# Patient Record
Sex: Female | Born: 1990 | Race: Black or African American | Hispanic: No | Marital: Single | Smoking: Never smoker
Health system: Southern US, Community
[De-identification: ages and names within clinical notes are randomized; demographics above are authoritative.]

## PROBLEM LIST (undated history)

## (undated) ENCOUNTER — Inpatient Hospital Stay (HOSPITAL_COMMUNITY): Payer: Self-pay

## (undated) DIAGNOSIS — F419 Anxiety disorder, unspecified: Secondary | ICD-10-CM

## (undated) DIAGNOSIS — T7840XA Allergy, unspecified, initial encounter: Secondary | ICD-10-CM

## (undated) DIAGNOSIS — D649 Anemia, unspecified: Secondary | ICD-10-CM

## (undated) DIAGNOSIS — L309 Dermatitis, unspecified: Secondary | ICD-10-CM

## (undated) DIAGNOSIS — F32A Depression, unspecified: Secondary | ICD-10-CM

## (undated) DIAGNOSIS — J189 Pneumonia, unspecified organism: Secondary | ICD-10-CM

## (undated) DIAGNOSIS — Z9889 Other specified postprocedural states: Secondary | ICD-10-CM

## (undated) HISTORY — DX: Allergy, unspecified, initial encounter: T78.40XA

## (undated) HISTORY — DX: Depression, unspecified: F32.A

## (undated) HISTORY — DX: Pneumonia, unspecified organism: J18.9

## (undated) HISTORY — DX: Dermatitis, unspecified: L30.9

## (undated) HISTORY — PX: WISDOM TOOTH EXTRACTION: SHX21

## (undated) HISTORY — DX: Anxiety disorder, unspecified: F41.9

## (undated) HISTORY — DX: Anemia, unspecified: D64.9

---

## 2012-04-21 DIAGNOSIS — Z9889 Other specified postprocedural states: Secondary | ICD-10-CM

## 2014-04-21 DIAGNOSIS — J984 Other disorders of lung: Secondary | ICD-10-CM | POA: Insufficient documentation

## 2014-04-21 HISTORY — DX: Other disorders of lung: J98.4

## 2014-10-05 DIAGNOSIS — L309 Dermatitis, unspecified: Secondary | ICD-10-CM | POA: Insufficient documentation

## 2017-03-16 ENCOUNTER — Other Ambulatory Visit: Payer: Self-pay | Admitting: Family Medicine

## 2017-03-16 DIAGNOSIS — S8992XD Unspecified injury of left lower leg, subsequent encounter: Secondary | ICD-10-CM

## 2017-03-17 ENCOUNTER — Ambulatory Visit
Admission: RE | Admit: 2017-03-17 | Discharge: 2017-03-17 | Disposition: A | Discharge status: Home / Self Care | Payer: BC Managed Care – PPO | Source: Ambulatory Visit | Attending: Family Medicine | Admitting: Family Medicine

## 2017-03-17 DIAGNOSIS — S8992XD Unspecified injury of left lower leg, subsequent encounter: Secondary | ICD-10-CM

## 2020-02-03 ENCOUNTER — Encounter: Payer: Self-pay | Admitting: Internal Medicine

## 2020-02-03 ENCOUNTER — Other Ambulatory Visit: Payer: Self-pay

## 2020-02-03 ENCOUNTER — Ambulatory Visit: Payer: BC Managed Care – PPO | Admitting: Internal Medicine

## 2020-02-03 VITALS — BP 110/70 | HR 108 | Temp 96.3°F | Ht 68.0 in | Wt 176.8 lb

## 2020-02-03 DIAGNOSIS — R079 Chest pain, unspecified: Secondary | ICD-10-CM

## 2020-02-03 DIAGNOSIS — J984 Other disorders of lung: Secondary | ICD-10-CM | POA: Diagnosis not present

## 2020-02-03 NOTE — Progress Notes (Signed)
Angela Sexton    [id number]    02-May-1990  Primary Care Physician:Morrison, Rachel Bo, FNP  Referring Physician: Trisha Mangle, FNP [address] [place],  [place] [address] Reason for Consultation: chest pain Date of Consultation: 02/03/2020  Chief complaint:   Chief Complaint  Patient presents with  . Consult    bronchogenic cyst, chest pain last few years     HPI: Angela Sexton is 29 y.o. woman who presents for chest pain. Started 7-8 years ago which prompted CT angio in 2015 showing bronchogenic cyst. A non contrasted CT chest in 2018 showed resolution.   Pain is sharp and lasts for minutes resolves spontaneously. Pain is localized to her right side of her sternum. Sometimes radiating. Pain can occur at rest, yesterday she was driving her car. Pain occurs 3-4 days/week usually no more than 1-2 times/day. No associated cough, hemoptysis, wheezing or dyspnea.  As an infant she had pneumonia and was hospitalized. Nothing since then. No asthma or recurrent infections.   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  Exposures: from [place]  Smoking history: never smoker. No passive smoke exposure  Social History   Occupational History  . Not on file  Tobacco Use  . Smoking status: Never Smoker  . Smokeless tobacco: Never Used  Substance and Sexual Activity  . Alcohol use: Not on file  . Drug use: Not on file  . Sexual activity: Not on file    Relevant family history:  Family History  Problem Relation Age of Onset  . Lung disease Neg Hx     History reviewed. No pertinent past medical history.  History reviewed. No pertinent surgical history.   Physical Exam: Blood pressure 110/70, pulse (!) 108, temperature (!) 96.3 F (35.7 C), temperature source Temporal, height 5\' 8"  (1.727 m), weight 176 lb 12.8 oz (80.2 kg), SpO2 97 %. Gen:      No acute distress ENT:  no nasal polyps, mucus membranes moist Lungs:    No  increased respiratory effort, symmetric chest wall excursion, clear to auscultation bilaterally, no wheezes or crackles, no tenderness to palpation.  CV:         Regular rate and rhythm; no murmurs, rubs, or gallops.  No pedal edema Abd:      + bowel sounds; soft, non-tender; no distension MSK: no acute synovitis of DIP or PIP joints, no mechanics hands.  Skin:      Warm and dry; no rashes Neuro: normal speech, no focal facial asymmetry Psych: alert and oriented x3, normal mood and affect   Data Reviewed/Medical Decision Making:  Independent interpretation of tests: Imaging:  CT Chest [facility] Dec 2015 Negative for acute pulmonary emboli..   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 Six-month followup recommended  Chest ct [facility] Jan 2018 report FINDINGS: Previously questioned bronchogenic cyst in the azygoesophageal recess is no longer seen. The trachea and mainstem bronchi are patent. The lungs are clear. No adenopathy. Normal heart size. No pleural or pericardial effusion. Negative upper  abdomen.   PFTs: normal spirometry in Jan 2018  Labs:  CBC shows Hgb 11.1, WBC 9.7 CMP shows K 3.4, otherwise all values WNL.   Immunization status:   There is no immunization history on file for this patient.  . I reviewed prior external note(s) from [facility] . I reviewed the result(s) of the labs and imaging as noted above.  . I have  ordered CT chest with contrast  Discussion of management or test interpretation with another colleague.  Assessment:  Chest Pain History of bronchogenic cyst  Plan/Recommendations: I Ms. Danner is a 29 year old woman with a history of bronchogenic cyst seen previously on CT angio on 2015 presents with chest pain.  Her chest pain is not clearly in a pulmonary or cardiac pattern.  It is not reproducible on exam.  Furthermore she is had a cardiac work-up including stress test recently at [facility].  Although her CT chest in 2018  showed resolution of the bronchogenic cyst, this was done without contrast.  Possible we are unable to visualize mediastinal structures.  We will proceed with a CT chest with contrast and she will bring her old CT scans at her next appointment for [place] to review.  We discussed disease management and progression at length today.    Return to Care: No follow-ups on file.  Durel Salts, MD Pulmonary and Critical Care Medicine [facility] Office:[phone]  CC: Grace Bushy [place], [place]

## 2020-02-03 NOTE — Patient Instructions (Signed)
The patient should have follow up scheduled with myself in 1 months.    Please call [facility] and get copies of your CT scans of the chest (2015 and 2018) put on a CD for me to review. Bring to the office.   I am ordering another scan of your chest to make sure there is no further bronchogenic cyst.

## 2020-02-20 ENCOUNTER — Other Ambulatory Visit: Payer: Medicaid Other

## 2020-02-28 ENCOUNTER — Ambulatory Visit (HOSPITAL_COMMUNITY): Payer: Medicaid Other | Attending: Internal Medicine

## 2020-03-06 ENCOUNTER — Encounter: Payer: Self-pay | Admitting: Internal Medicine

## 2020-03-06 ENCOUNTER — Ambulatory Visit: Payer: Medicaid Other | Admitting: Internal Medicine

## 2020-03-06 ENCOUNTER — Other Ambulatory Visit: Payer: Self-pay

## 2020-03-06 VITALS — BP 118/76 | HR 88 | Temp 97.7°F | Ht 68.0 in | Wt 180.8 lb

## 2020-03-06 DIAGNOSIS — J984 Other disorders of lung: Secondary | ICD-10-CM | POA: Diagnosis not present

## 2020-03-06 DIAGNOSIS — R079 Chest pain, unspecified: Secondary | ICD-10-CM

## 2020-03-06 NOTE — Patient Instructions (Signed)
Please obtain your CT scan. I will contact you with the results and next steps.

## 2020-03-06 NOTE — Progress Notes (Signed)
         Angela Sexton    [id number]    Feb 08, 1991  Primary Care Physician:Morrison, Rachel Bo, FNP Date of Appointment: 03/06/2020 Established Patient Visit  Chief complaint:   Chief Complaint  Patient presents with  . Follow-up    on bronchial genetic cyst    HPI: Angela Sexton is a 29 y.o. woman with history of recurrent chest pain and previous history of bronchogenic cyst.  Interval Updates: Presents for follow up today. Was unaware for appointment for CT scan last week. No further episodes of chest pain or shortness of breath.   I have reviewed the patient's family social and past medical history and updated as appropriate.   History reviewed. No pertinent past medical history.  History reviewed. No pertinent surgical history.  Family History  Problem Relation Age of Onset  . Lung disease Neg Hx     Social History   Occupational History  . Not on file  Tobacco Use  . Smoking status: Never Smoker  . Smokeless tobacco: Never Used  Substance and Sexual Activity  . Alcohol use: Not on file  . Drug use: Not on file  . Sexual activity: Not on file     Physical Exam: Blood pressure 118/76, pulse 88, temperature 97.7 F (36.5 C), temperature source Oral, height 5\' 8"  (1.727 m), weight 180 lb 12.8 oz (82 kg), SpO2 98 %.  Gen:      No acute distress Lungs:    No increased respiratory effort, symmetric chest wall excursion, clear to auscultation bilaterally, no wheezes or crackles CV:         Regular rate and rhythm; no murmurs, rubs, or gallops.  No pedal edema   Data Reviewed: Imaging: Previous reports reviewed.   CT Chest [facility] Dec 2015 Negative for acute pulmonary emboli..   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 Six-month followup recommended  Chest ct [facility] Jan 2018 report FINDINGS: Previously questioned bronchogenic cyst in the azygoesophageal recess is no longer seen. The trachea and mainstem bronchi are patent. The  lungs are clear. No adenopathy. Normal heart size. No pleural or pericardial effusion. Negative upper  abdomen.  PFTs: None on file  Labs:  Immunization status: Immunization History  Administered Date(s) Administered  . PFIZER SARS-COV-2 Vaccination 06/28/2019, 07/29/2019    Assessment:  Chest Pain History of bronchogenic cyst  Plan/Recommendations: I Angela Sexton is a 29 year old woman with a history of bronchogenic cyst seen previously on CT angio on 2015 presents with chest pain.  Her chest pain is not clearly in a pulmonary or cardiac pattern.  It is not reproducible on exam.  Furthermore she is had a cardiac work-up including stress test recently at [facility].  Although her CT chest in 2018 showed resolution of the bronchogenic cyst, this was done without contrast.  Possible we are unable to visualize mediastinal structures.    I have asked her to get a CT scan with contrast to evaluate if she still even has this bronchogenic cyst. Will help patient obtain this with reschedule appt today. I will contact her with the results of the CT scan.   Return to Care: Pending results of CT scan.   2019, MD Pulmonary and Critical Care Medicine [facility] Office:[phone]

## 2020-03-28 ENCOUNTER — Other Ambulatory Visit: Payer: Medicaid Other

## 2020-04-02 ENCOUNTER — Ambulatory Visit (HOSPITAL_BASED_OUTPATIENT_CLINIC_OR_DEPARTMENT_OTHER): Payer: Medicaid Other

## 2020-09-26 ENCOUNTER — Emergency Department (HOSPITAL_BASED_OUTPATIENT_CLINIC_OR_DEPARTMENT_OTHER)
Admission: EM | Admit: 2020-09-26 | Discharge: 2020-09-27 | Disposition: A | Discharge status: Home / Self Care | Payer: Medicaid Other | Attending: Emergency Medicine | Admitting: Emergency Medicine

## 2020-09-26 ENCOUNTER — Other Ambulatory Visit: Payer: Self-pay

## 2020-09-26 DIAGNOSIS — Z20822 Contact with and (suspected) exposure to covid-19: Secondary | ICD-10-CM | POA: Insufficient documentation

## 2020-09-26 DIAGNOSIS — R0789 Other chest pain: Secondary | ICD-10-CM | POA: Diagnosis not present

## 2020-09-26 DIAGNOSIS — R590 Localized enlarged lymph nodes: Secondary | ICD-10-CM | POA: Diagnosis not present

## 2020-09-26 DIAGNOSIS — R079 Chest pain, unspecified: Secondary | ICD-10-CM | POA: Diagnosis present

## 2020-09-27 ENCOUNTER — Emergency Department (HOSPITAL_BASED_OUTPATIENT_CLINIC_OR_DEPARTMENT_OTHER): Payer: Medicaid Other | Admitting: Radiology

## 2020-09-27 ENCOUNTER — Encounter (HOSPITAL_BASED_OUTPATIENT_CLINIC_OR_DEPARTMENT_OTHER): Payer: Self-pay

## 2020-09-27 ENCOUNTER — Emergency Department (HOSPITAL_BASED_OUTPATIENT_CLINIC_OR_DEPARTMENT_OTHER): Payer: Medicaid Other

## 2020-09-27 ENCOUNTER — Other Ambulatory Visit: Payer: Self-pay

## 2020-09-27 LAB — COMPREHENSIVE METABOLIC PANEL
ALT: 11 U/L (ref 0–44)
AST: 21 U/L (ref 15–41)
Albumin: 3.9 g/dL (ref 3.5–5.0)
Alkaline Phosphatase: 53 U/L (ref 38–126)
Anion gap: 7 (ref 5–15)
BUN: 6 mg/dL (ref 6–20)
CO2: 30 mmol/L (ref 22–32)
Calcium: 9.2 mg/dL (ref 8.9–10.3)
Chloride: 103 mmol/L (ref 98–111)
Creatinine, Ser: 0.56 mg/dL (ref 0.44–1.00)
GFR, Estimated: >60 mL/min (ref >60)
Glucose, Bld: 86 mg/dL (ref 70–99)
Potassium: 3.9 mmol/L (ref 3.5–5.1)
Sodium: 140 mmol/L (ref 135–145)
Total Bilirubin: 0.6 mg/dL (ref 0.3–1.2)
Total Protein: 7.6 g/dL (ref 6.5–8.1)

## 2020-09-27 LAB — URINALYSIS, ROUTINE W REFLEX MICROSCOPIC
Bilirubin Urine: NEGATIVE
Glucose, UA: NEGATIVE mg/dL
Hgb urine dipstick: NEGATIVE
Ketones, ur: NEGATIVE mg/dL
Leukocytes,Ua: NEGATIVE
Nitrite: NEGATIVE
Specific Gravity, Urine: 1.022 (ref 1.005–1.030)
pH: 6.5 (ref 5.0–8.0)

## 2020-09-27 LAB — CBC
HCT: 36.7 % (ref 36.0–46.0)
Hemoglobin: 11.8 g/dL — ABNORMAL LOW (ref 12.0–15.0)
MCH: 29.1 pg (ref 26.0–34.0)
MCHC: 32.2 g/dL (ref 30.0–36.0)
MCV: 90.6 fL (ref 80.0–100.0)
Platelets: 328 10*3/uL (ref 150–400)
RBC: 4.05 MIL/uL (ref 3.87–5.11)
RDW: 12.6 % (ref 11.5–15.5)
WBC: 7.7 10*3/uL (ref 4.0–10.5)
nRBC: 0 % (ref 0.0–0.2)

## 2020-09-27 LAB — LIPASE, BLOOD: Lipase: 19 U/L (ref 11–51)

## 2020-09-27 LAB — RESP PANEL BY RT-PCR (FLU A&B, COVID) ARPGX2
Influenza A by PCR: NEGATIVE
Influenza B by PCR: NEGATIVE
SARS Coronavirus 2 by RT PCR: NEGATIVE

## 2020-09-27 LAB — TROPONIN I (HIGH SENSITIVITY)
Troponin I (High Sensitivity): <2 ng/L (ref <18)
Troponin I (High Sensitivity): <2 ng/L (ref <18)

## 2020-09-27 LAB — PREGNANCY, URINE: Preg Test, Ur: NEGATIVE

## 2020-09-27 LAB — D-DIMER, QUANTITATIVE: D-Dimer, Quant: 0.77 ug/mL-FEU — ABNORMAL HIGH (ref 0.00–0.50)

## 2020-09-27 MED ORDER — METHOCARBAMOL 500 MG PO TABS: 500.0000 mg | ORAL_TABLET | Freq: Two times a day (BID) | ORAL | 0 refills | Status: DC

## 2020-09-27 MED ORDER — IBUPROFEN 600 MG PO TABS: 600.0000 mg | ORAL_TABLET | Freq: Four times a day (QID) | ORAL | 0 refills | Status: DC | PRN

## 2020-09-27 MED ORDER — KETOROLAC TROMETHAMINE 30 MG/ML IJ SOLN
30.0000 mg | Freq: Once | INTRAMUSCULAR | Status: AC
Start: 2020-09-27 — End: 2020-09-27
  Administered 2020-09-27: 30 mg via INTRAVENOUS
  Filled 2020-09-27: qty 1

## 2020-09-27 MED ORDER — IOHEXOL 350 MG/ML SOLN
100.0000 mL | Freq: Once | INTRAVENOUS | Status: AC | PRN
  Administered 2020-09-27: 100 mL via INTRAVENOUS

## 2020-09-27 NOTE — ED Provider Notes (Signed)
[facility] EMERGENCY DEPT Provider Note   CSN: [id number] Arrival date & time: 09/26/20  2356     History Chief Complaint  Patient presents with   Chest Pain    Angela Sexton is a 30 y.o. female.  Pt presents to the ED today with CP.  Pt said she has had pain for a week.  She has a hx of a bronchogenic cyst diagnosed on CT in the ED in 2015.  She never followed up with pulmonology and was concerned about that.  She said the pain is associated with movement and sometimes goes to her back.  No trauma or new activity.  No sob.       History reviewed. No pertinent past medical history.  There are no problems to display for this patient.   History reviewed. No pertinent surgical history.   OB History   No obstetric history on file.     Family History  Problem Relation Age of Onset   Lung disease Neg Hx     Social History   Tobacco Use   Smoking status: Never   Smokeless tobacco: Never    Home Medications Prior to Admission medications   Medication Sig Start Date End Date Taking? Authorizing Provider  ibuprofen (ADVIL) 600 MG tablet Take 1 tablet (600 mg total) by mouth every 6 (six) hours as needed. 09/27/20  Yes Jacalyn Lefevre, MD  methocarbamol (ROBAXIN) 500 MG tablet Take 1 tablet (500 mg total) by mouth 2 (two) times daily. 09/27/20  Yes Jacalyn Lefevre, MD  FLUoxetine (PROZAC) 10 MG capsule Take 10 mg by mouth daily.    [provider]  hydrOXYzine (ATARAX/VISTARIL) 10 MG tablet Take 10 mg by mouth 3 (three) times daily as needed.    [provider]  QUEtiapine (SEROQUEL) 100 MG tablet Take 100 mg by mouth at bedtime.    [provider]    Allergies    Sulfa antibiotics  Review of Systems   Review of Systems  Cardiovascular:  Positive for chest pain.  All other systems reviewed and are negative.  Physical Exam Updated Vital Signs BP 129/76 (BP Location: Left Arm)   Pulse 91   Temp 98.8 F (37.1 C) (Oral)    Resp 19   Ht 5\' 8"  (1.727 m)   Wt 70.3 kg   SpO2 100%   BMI 23.57 kg/m   Physical Exam Vitals and nursing note reviewed.  Constitutional:      Appearance: She is well-developed.  HENT:     Head: Normocephalic and atraumatic.  Eyes:     Extraocular Movements: Extraocular movements intact.     Pupils: Pupils are equal, round, and reactive to light.  Cardiovascular:     Rate and Rhythm: Normal rate and regular rhythm.     Heart sounds: Normal heart sounds.  Pulmonary:     Effort: Pulmonary effort is normal.     Breath sounds: Normal breath sounds.  Chest:    Abdominal:     General: Bowel sounds are normal.     Palpations: Abdomen is soft.  Musculoskeletal:        General: Normal range of motion.     Cervical back: Normal range of motion and neck supple.  Skin:    General: Skin is warm.     Capillary Refill: Capillary refill takes less than 2 seconds.  Neurological:     General: No focal deficit present.     Mental Status: She is alert and oriented to person,  place, and time.    ED Results / Procedures / Treatments   Labs (all labs ordered are listed, but only abnormal results are displayed) Labs Reviewed  CBC - Abnormal; Notable for the following components:      Result Value   Hemoglobin 11.8 (*)    All other components within normal limits  D-DIMER, QUANTITATIVE - Abnormal; Notable for the following components:   D-Dimer, Quant 0.77 (*)    All other components within normal limits  COMPREHENSIVE METABOLIC PANEL  LIPASE, BLOOD  URINALYSIS, ROUTINE W REFLEX MICROSCOPIC  PREGNANCY, URINE  TROPONIN I (HIGH SENSITIVITY)  TROPONIN I (HIGH SENSITIVITY)    EKG EKG Interpretation  Date/Time:  Thursday September 27 2020 00:08:03 EDT Ventricular Rate:  94 PR Interval:  148 QRS Duration: 88 QT Interval:  347 QTC Calculation: 434 R Axis:   57 Text Interpretation: Sinus rhythm No old tracing to compare Confirmed by Jacalyn Lefevre [phone]) on 09/27/2020 12:11:34  AM   Radiology DG Chest 2 View  Result Date: 09/27/2020 CLINICAL DATA:  Chest pain EXAM: CHEST - 2 VIEW COMPARISON:  CT chest angio 09/27/2020, chest x-ray 03/09/2017 FINDINGS: The heart size and mediastinal contours are within normal limits. No focal consolidation. No pulmonary edema. No pleural effusion. No pneumothorax. No acute osseous abnormality. IMPRESSION: No active cardiopulmonary disease. Electronically Signed   By: Tish Frederickson M.D.   On: 09/27/2020 01:53   CT Angio Chest PE W and/or Wo Contrast  Result Date: 09/27/2020 CLINICAL DATA:  chest pain that started a week ago. Pt states it is in the middle of her chest and sometimes goes to her back. EXAM: CT ANGIOGRAPHY CHEST WITH CONTRAST TECHNIQUE: Multidetector CT imaging of the chest was performed using the standard protocol during bolus administration of intravenous contrast. Multiplanar CT image reconstructions and MIPs were obtained to evaluate the vascular anatomy. CONTRAST:  OMNIPAQUE IOHEXOL 350 MG/ML SOLN COMPARISON:  None. FINDINGS: Cardiovascular: Satisfactory opacification of the pulmonary arteries to the segmental level. No evidence of pulmonary embolism. Normal heart size. No significant pericardial effusion. The thoracic aorta is normal in caliber. No atherosclerotic plaque of the thoracic aorta. No coronary artery calcifications. Poor evaluation of possible venous variant (4:41). Mediastinum/Nodes: Prominent and borderline enlarged up to 1.2 cm bilateral axillary lymph nodes (5:61, 70). Question bilateral supraclavicular lymph nodes with markedly limited evaluation due to streak artifact originating from external jewelry. No enlarged mediastinal or hilarlymph nodes. Thyroid gland, trachea, and esophagus demonstrate no significant findings. Lungs/Pleura: Lungs are clear. No pleural effusion or pneumothorax. Upper Abdomen: No acute abnormality. Musculoskeletal: No chest wall abnormality. No acute or significant osseous  findings. Review of the MIP images confirms the above findings. IMPRESSION: Nonspecific bilateral borderline enlarged axillary lymph nodes. Question bilateral supraclavicular lymph nodes with markedly limited evaluation due to streak artifact originating from external jewelry. 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 Electronically Signed   By: Tish Frederickson M.D.   On: 09/27/2020 01:42    Procedures Procedures   Medications Ordered in ED Medications  ketorolac (TORADOL) 30 MG/ML injection 30 mg (30 mg Intravenous Given 09/27/20 0029)  iohexol (OMNIPAQUE) 350 MG/ML injection 100 mL (100 mLs Intravenous Contrast Given 09/27/20 0054)    ED Course  I have reviewed the triage vital signs and the nursing notes.  Pertinent labs & imaging results that were available during my care of the patient were reviewed by me and considered in my medical decision making (see chart for details).    MDM Rules/Calculators/A&P  DDimer +.  CT ordered to r/o PE.  CT chest shows no PE, but does show lymphadenopathy.  She is referred to pulmonology.  Covid swab has been sent.  CP likely msk.  Cardiac work up neg.  She will be d/c with ibuprofen and robaxin.  Final Clinical Impression(s) / ED Diagnoses Final diagnoses:  Atypical chest pain  Lymphadenopathy, thoracic    Rx / DC Orders ED Discharge Orders          Ordered    Ambulatory referral to Pulmonology       Comments: Intrathoracic lymphadenopathy   09/27/20 0230    ibuprofen (ADVIL) 600 MG tablet  Every 6 hours PRN        09/27/20 0231    methocarbamol (ROBAXIN) 500 MG tablet  2 times daily        09/27/20 0231             Jacalyn Lefevre, MD 09/27/20 [phone]

## 2020-09-27 NOTE — ED Notes (Signed)
See paper chart for downtime charting 

## 2020-09-27 NOTE — ED Triage Notes (Signed)
Pt c/o chest pain that started a week ago. Pt states it is in the middle of her chest and sometimes goes to her back.

## 2020-09-28 ENCOUNTER — Telehealth: Payer: Self-pay | Admitting: Physician Assistant

## 2020-09-28 NOTE — Telephone Encounter (Signed)
Scheduled appt per 6/9 referral. Pt aware.  

## 2020-10-01 ENCOUNTER — Ambulatory Visit: Payer: Medicaid Other | Admitting: Internal Medicine

## 2020-10-09 ENCOUNTER — Encounter: Payer: Self-pay | Admitting: Physician Assistant

## 2020-10-09 ENCOUNTER — Inpatient Hospital Stay: Payer: Medicaid Other | Attending: Physician Assistant | Admitting: Physician Assistant

## 2020-10-09 ENCOUNTER — Inpatient Hospital Stay: Payer: Medicaid Other

## 2020-10-09 ENCOUNTER — Other Ambulatory Visit: Payer: Self-pay

## 2020-10-09 VITALS — BP 110/72 | HR 84 | Temp 98.1°F | Resp 18 | Ht 68.0 in | Wt 155.4 lb

## 2020-10-09 DIAGNOSIS — R634 Abnormal weight loss: Secondary | ICD-10-CM | POA: Diagnosis not present

## 2020-10-09 DIAGNOSIS — R590 Localized enlarged lymph nodes: Secondary | ICD-10-CM

## 2020-10-09 DIAGNOSIS — R0789 Other chest pain: Secondary | ICD-10-CM

## 2020-10-09 DIAGNOSIS — R112 Nausea with vomiting, unspecified: Secondary | ICD-10-CM | POA: Diagnosis not present

## 2020-10-09 HISTORY — DX: Localized enlarged lymph nodes: R59.0

## 2020-10-09 LAB — CBC WITH DIFFERENTIAL (CANCER CENTER ONLY)
Abs Immature Granulocytes: 0.03 10*3/uL (ref 0.00–0.07)
Basophils Absolute: 0 10*3/uL (ref 0.0–0.1)
Basophils Relative: 1 %
Eosinophils Absolute: 0.2 10*3/uL (ref 0.0–0.5)
Eosinophils Relative: 2 %
HCT: 32.7 % — ABNORMAL LOW (ref 36.0–46.0)
Hemoglobin: 10.5 g/dL — ABNORMAL LOW (ref 12.0–15.0)
Immature Granulocytes: 0 %
Lymphocytes Relative: 31 %
Lymphs Abs: 2.4 10*3/uL (ref 0.7–4.0)
MCH: 29.2 pg (ref 26.0–34.0)
MCHC: 32.1 g/dL (ref 30.0–36.0)
MCV: 91.1 fL (ref 80.0–100.0)
Monocytes Absolute: 0.5 10*3/uL (ref 0.1–1.0)
Monocytes Relative: 7 %
Neutro Abs: 4.5 10*3/uL (ref 1.7–7.7)
Neutrophils Relative %: 59 %
Platelet Count: 321 10*3/uL (ref 150–400)
RBC: 3.59 MIL/uL — ABNORMAL LOW (ref 3.87–5.11)
RDW: 13.2 % (ref 11.5–15.5)
WBC Count: 7.6 10*3/uL (ref 4.0–10.5)
nRBC: 0 % (ref 0.0–0.2)

## 2020-10-09 LAB — CMP (CANCER CENTER ONLY)
ALT: 12 U/L (ref 0–44)
AST: 14 U/L — ABNORMAL LOW (ref 15–41)
Albumin: 3.8 g/dL (ref 3.5–5.0)
Alkaline Phosphatase: 53 U/L (ref 38–126)
Anion gap: 6 (ref 5–15)
BUN: 7 mg/dL (ref 6–20)
CO2: 30 mmol/L (ref 22–32)
Calcium: 9 mg/dL (ref 8.9–10.3)
Chloride: 105 mmol/L (ref 98–111)
Creatinine: 0.6 mg/dL (ref 0.44–1.00)
GFR, Estimated: >60 mL/min (ref >60)
Glucose, Bld: 86 mg/dL (ref 70–99)
Potassium: 3.7 mmol/L (ref 3.5–5.1)
Sodium: 141 mmol/L (ref 135–145)
Total Bilirubin: 0.5 mg/dL (ref 0.3–1.2)
Total Protein: 7.7 g/dL (ref 6.5–8.1)

## 2020-10-09 LAB — LACTATE DEHYDROGENASE: LDH: 186 U/L (ref 98–192)

## 2020-10-09 LAB — C-REACTIVE PROTEIN: CRP: 0.7 mg/dL (ref <1.0)

## 2020-10-09 LAB — SEDIMENTATION RATE: Sed Rate: 60 mm/hr — ABNORMAL HIGH (ref 0–22)

## 2020-10-09 NOTE — Progress Notes (Signed)
[facility] [phone]   [phone]  INITIAL CONSULT NOTE  Patient Care Team: Trisha Mangle, FNP as PCP - General (Family Medicine)  Hematological/Oncological History 1) 09/27/2020: Presented to ED due to chest pain x 1 week. CTA chest was obtained that revealed nonspecific bilateral borderline enlarged axillary lymph nodes. In addition, there were questionable bilateral supraclavicular lymph nodes.   2) 10/09/2020: Establish care with Georga Kaufmann PA-C  CHIEF COMPLAINTS/PURPOSE OF CONSULTATION:  "Borderline Enlarged Axillary Lymphadenopathy "  HISTORY OF PRESENTING ILLNESS:  Angela Sexton 30 y.o. female presents to the clinic for evaluation for enlarged axillary lymphadenopathy. Patient is accompanied by her mother for this visit.   On review of the previous records, Angela Sexton presented to the emergency room on 09/27/2020 for recurrent central chest pain. CTA chest was obtained that revealed borderline enlarged axillary lymph nodes and possible bilateral supraclavicular lymph nodes. Felt symptoms were musculoskeletal in nature and was discharged with Ibuprofen and Robaxin.   On exam today, Angela Sexton reports chronic fatigue that has been present for the past 1-2 years. She is able to complete all her ADLs on her own but rests often. She notes a fair appetite with approximate 25 pound unintentional weight loss since November 2021. She reports intermittent episodes of nausea and vomiting without any specific triggers. She denies any abdominal pain or changes in her bowel habits. She denies easy bruising or signs of bleeding except for her monthly menstrual cycles. She notes mild ankle swelling without any pain or redness. Patient continues to have intermittent episodes of central chest pain that is sharp in nature. She denies any triggers to the chest pain and notes that ibuprofen and robaxin don't improve the pain. Patient denies any fevers, chills, night  sweats, shortness of breath or cough. She has no other complaints.   MEDICAL HISTORY:  History reviewed. No pertinent past medical history.  SURGICAL HISTORY: History reviewed. No pertinent surgical history.  SOCIAL HISTORY: Social History   Socioeconomic History   Marital status: Single    Spouse name: Not on file   Number of children: Not on file   Years of education: Not on file   Highest education level: Not on file  Occupational History   Not on file  Tobacco Use   Smoking status: Never   Smokeless tobacco: Never  Substance and Sexual Activity   Alcohol use: Never   Drug use: Never   Sexual activity: Not on file  Other Topics Concern   Not on file  Social History Narrative   Not on file   Social Determinants of Health   Financial Resource Strain: Not on file  Food Insecurity: Not on file  Transportation Needs: Not on file  Physical Activity: Not on file  Stress: Not on file  Social Connections: Not on file  Intimate Partner Violence: Not on file    FAMILY HISTORY: Family History  Problem Relation Age of Onset   Scleroderma Father    Melanoma Maternal Grandfather    Lung disease Neg Hx     ALLERGIES:  is allergic to sulfa antibiotics.  MEDICATIONS:  Current Outpatient Medications  Medication Sig Dispense Refill   methocarbamol (ROBAXIN) 500 MG tablet Take 1 tablet (500 mg total) by mouth 2 (two) times daily. 20 tablet 0   No current facility-administered medications for this visit.    REVIEW OF SYSTEMS:   Constitutional: ( - ) fevers, ( - )  chills , ( - ) night sweats Eyes: ( - )  blurriness of vision, ( - ) double vision, ( - ) watery eyes Ears, nose, mouth, throat, and face: ( - ) mucositis, ( - ) sore throat Respiratory: ( - ) cough, ( - ) dyspnea, ( - ) wheezes Cardiovascular: ( - ) palpitation, ( - ) chest discomfort, ( +) lower extremity swelling Gastrointestinal:  ( + ) nausea, ( - ) heartburn, ( - ) change in bowel habits Skin: ( - )  abnormal skin rashes Lymphatics: ( - ) new lymphadenopathy, ( - ) easy bruising Neurological: ( - ) numbness, ( - ) tingling, ( - ) new weaknesses Behavioral/Psych: ( - ) mood change, ( - ) new changes  All other systems were reviewed with the patient and are negative.  PHYSICAL EXAMINATION: ECOG PERFORMANCE STATUS: 1 - Symptomatic but completely ambulatory  Vitals:   10/09/20 1128  BP: 110/72  Pulse: 84  Resp: 18  Temp: 98.1 F (36.7 C)  SpO2: 100%   Filed Weights   10/09/20 1128  Weight: 155 lb 6.4 oz (70.5 kg)    GENERAL: well appearing African American female in NAD  SKIN: skin color, texture, turgor are normal, no rashes or significant lesions EYES: conjunctiva are pink and non-injected, sclera clear OROPHARYNX: no exudate, no erythema; lips, buccal mucosa, and tongue normal  NECK: supple, non-tender LYMPH:  no palpable lymphadenopathy in the cervical, axillary or supraclavicular lymph nodes.  LUNGS: clear to auscultation and percussion with normal breathing effort HEART: regular rate & rhythm and no murmurs. Mild bilateral ankle edema.  ABDOMEN: soft, non-tender, non-distended, normal bowel sounds Musculoskeletal: no cyanosis of digits and no clubbing  PSYCH: alert & oriented x 3, fluent speech NEURO: no focal motor/sensory deficits  LABORATORY DATA:  I have reviewed the data as listed CBC Latest Ref Rng & Units 09/27/2020  WBC 4.0 - 10.5 K/uL 7.7  Hemoglobin 12.0 - 15.0 g/dL 11.8(L)  Hematocrit 36.0 - 46.0 % 36.7  Platelets 150 - 400 K/uL 328    CMP Latest Ref Rng & Units 09/27/2020  Glucose 70 - 99 mg/dL 86  BUN 6 - 20 mg/dL 6  Creatinine 0.27 - 7.41 mg/dL 2.87  Sodium 867 - 672 mmol/L 140  Potassium 3.5 - 5.1 mmol/L 3.9  Chloride 98 - 111 mmol/L 103  CO2 22 - 32 mmol/L 30  Calcium 8.9 - 10.3 mg/dL 9.2  Total Protein 6.5 - 8.1 g/dL 7.6  Total Bilirubin 0.3 - 1.2 mg/dL 0.6  Alkaline Phos 38 - 126 U/L 53  AST 15 - 41 U/L 21  ALT 0 - 44 U/L 11     RADIOGRAPHIC STUDIES: I have personally reviewed the radiological images as listed and agreed with the findings in the report. DG Chest 2 View  Result Date: 09/27/2020 CLINICAL DATA:  Chest pain EXAM: CHEST - 2 VIEW COMPARISON:  CT chest angio 09/27/2020, chest x-ray 03/09/2017 FINDINGS: The heart size and mediastinal contours are within normal limits. No focal consolidation. No pulmonary edema. No pleural effusion. No pneumothorax. No acute osseous abnormality. IMPRESSION: No active cardiopulmonary disease. Electronically Signed   By: Tish Frederickson M.D.   On: 09/27/2020 01:53   CT Angio Chest PE W and/or Wo Contrast  Result Date: 09/27/2020 CLINICAL DATA:  chest pain that started a week ago. Pt states it is in the middle of her chest and sometimes goes to her back. EXAM: CT ANGIOGRAPHY CHEST WITH CONTRAST TECHNIQUE: Multidetector CT imaging of the chest was performed using the standard protocol during bolus  administration of intravenous contrast. Multiplanar CT image reconstructions and MIPs were obtained to evaluate the vascular anatomy. CONTRAST:  100mL OMNIPAQUE IOHEXOL 350 MG/ML SOLN COMPARISON:  None. FINDINGS: Cardiovascular: Satisfactory opacification of the pulmonary arteries to the segmental level. No evidence of pulmonary embolism. Normal heart size. No significant pericardial effusion. The thoracic aorta is normal in caliber. No atherosclerotic plaque of the thoracic aorta. No coronary artery calcifications. Poor evaluation of possible venous variant (4:41). Mediastinum/Nodes: Prominent and borderline enlarged up to 1.2 cm bilateral axillary lymph nodes (5:61, 70). Question bilateral supraclavicular lymph nodes with markedly limited evaluation due to streak artifact originating from external jewelry. No enlarged mediastinal or hilarlymph nodes. Thyroid gland, trachea, and esophagus demonstrate no significant findings. Lungs/Pleura: Lungs are clear. No pleural effusion or pneumothorax. Upper  Abdomen: No acute abnormality. Musculoskeletal: No chest wall abnormality. No acute or significant osseous findings. Review of the MIP images confirms the above findings. IMPRESSION: Nonspecific bilateral borderline enlarged axillary lymph nodes. Question bilateral supraclavicular lymph nodes with markedly limited evaluation due to streak artifact originating from external jewelry. 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 Electronically Signed   By: Tish FredericksonMorgane  Naveau M.D.   On: 09/27/2020 01:42    ASSESSMENT & PLAN Angela Sexton is a 30 y.o. female presenting to the clinic for evaluation for axillary lymphadenopathy with questionable supraclavicular lymphadenopathy. I reviewed potential etiologies for lymphadenopathy including infectious process, inflammatory process and lymphoproliferative disorders. Patient denies any recent infectious process and she is afebrile. The recommendation is to proceed with labs for further workup including CBC, CMP, LDH, CRP and Sed rate. In addition, we will repeat CT chest in 2-3 weeks to evaluate the noted enlarged lymph nodes seen on CT scan from 09/27/2020.   #Bilateral axillary lymphadenopathy and questionable bilateral supraclavicular lymphadenopathy: --Etiology unknown. Seen on CTA chest from 09/27/2020 --Patient denies recent COVID vaccination/booster. She denies any breast symptoms.  --Recommend lab work today to check CBC, CMP, LDH, CRP and Sed rate.  --Repeat CT chest in 2-3 weeks to check lymphadenopathy. --Consider excisional biopsy if lymph nodes have not improved or worsened.  --RTC 10/31/2020 with labs.   #Central chest pain: --Chronic but etiology known.  --Patient has been evaluated by pulmonology in October/November 2021 and did not feel chest pain was pulmonary process.  --Troponin levels from 09/27/2020 was not elevated.  --Will further evaluate with upcoming CT scan.  --Request PCP to follow up if above workup is negative.   Orders Placed  This Encounter  Procedures   CT Chest W Contrast    Standing Status:   Future    Standing Expiration Date:   10/09/2021    Order Specific Question:   If indicated for the ordered procedure, I authorize the administration of contrast media per Radiology protocol    Answer:   Yes    Order Specific Question:   Is patient pregnant?    Answer:   No    Order Specific Question:   [facility]?    [facility]   CBC with Differential (Cancer Center Only)    Standing Status:   Future    Number of Occurrences:   1    Standing Expiration Date:   10/08/2021   CMP (Cancer Center only)    Standing Status:   Future    Number of Occurrences:   1    Standing Expiration Date:   10/08/2021   Lactate dehydrogenase (LDH)    Standing Status:   Future  Number of Occurrences:   1    Standing Expiration Date:   10/08/2021   Sedimentation rate    Standing Status:   Future    Number of Occurrences:   1    Standing Expiration Date:   10/08/2021   C-reactive protein    Standing Status:   Future    Number of Occurrences:   1    Standing Expiration Date:   10/08/2021    All questions were answered. The patient knows to call the clinic with any problems, questions or concerns.  I have spent a total of 60 minutes minutes of face-to-face and non-face-to-face time, preparing to see the patient, obtaining and/or reviewing separately obtained history, performing a medically appropriate examination, counseling and educating the patient, ordering medications/tests, documenting clinical information in the electronic health record, and care coordination.   Georga Kaufmann, PA-C Department of Hematology/Oncology [facility] Phone: [phone]

## 2020-10-15 ENCOUNTER — Telehealth: Payer: Self-pay | Admitting: Physician Assistant

## 2020-10-15 DIAGNOSIS — D649 Anemia, unspecified: Secondary | ICD-10-CM

## 2020-10-15 DIAGNOSIS — R590 Localized enlarged lymph nodes: Secondary | ICD-10-CM

## 2020-10-15 NOTE — Telephone Encounter (Signed)
I called [name] to review the lab results from 10/09/2020. CBC revealed normocytic anemia with hemoglobin of 10.5. We will plan to obtain iron, vitamin B12 and folate levels at her next visit. In addition, the sed rate was elevated at 60. I explained that sed rate can be elevated for various reasons but it will be best to further evaluate once we review the upcoming CT imaging. If axillary lymph nodes have improved and sed rate is still elevated, we will place a referral to rheumatology to evaluate for inflammatory conditions. If axillary lymph nodes are still enlarged, we will pursue excisional biopsy to further evaluate.   Patient expressed understanding and satisfaction with the plan provided.

## 2020-10-23 ENCOUNTER — Ambulatory Visit (HOSPITAL_COMMUNITY): Payer: Medicaid Other

## 2020-10-25 ENCOUNTER — Other Ambulatory Visit: Payer: Self-pay | Admitting: Physician Assistant

## 2020-10-25 ENCOUNTER — Telehealth: Payer: Self-pay | Admitting: *Deleted

## 2020-10-25 DIAGNOSIS — R590 Localized enlarged lymph nodes: Secondary | ICD-10-CM

## 2020-10-25 NOTE — Telephone Encounter (Signed)
TCT patient regarding CT scan. Spoke with her and advised that her insurance would not approve a CT scan unless an Ultrasound was done of the area in questions. Advised that Georga Kaufmann, PA has ordered the [place]. Provided th3 to central radiology scheduling so pt could schedule the [place] at her convenience. Pt voiced understanding and will make the call.

## 2020-10-26 ENCOUNTER — Telehealth: Payer: Self-pay | Admitting: Physician Assistant

## 2020-10-26 ENCOUNTER — Other Ambulatory Visit: Payer: Self-pay | Admitting: Physician Assistant

## 2020-10-26 ENCOUNTER — Telehealth: Payer: Self-pay

## 2020-10-26 DIAGNOSIS — R590 Localized enlarged lymph nodes: Secondary | ICD-10-CM

## 2020-10-26 NOTE — Telephone Encounter (Signed)
Scheduled appts per 7/8 sch msg. Pt aware.  

## 2020-10-26 NOTE — Telephone Encounter (Signed)
I called the patient to inform her of the Ultrasound appointments set for 11/02/20 at [facility] 1:30 pm. I advised the patient to arrive 15 minutes prior and she verbalized understanding.

## 2020-10-30 ENCOUNTER — Ambulatory Visit (HOSPITAL_COMMUNITY): Payer: Medicaid Other

## 2020-10-31 ENCOUNTER — Other Ambulatory Visit: Payer: Medicaid Other

## 2020-10-31 ENCOUNTER — Ambulatory Visit: Payer: Medicaid Other | Admitting: Physician Assistant

## 2020-11-02 ENCOUNTER — Ambulatory Visit (HOSPITAL_COMMUNITY)
Admission: RE | Admit: 2020-11-02 | Discharge: 2020-11-02 | Disposition: A | Discharge status: Home / Self Care | Payer: Medicaid Other | Source: Ambulatory Visit | Attending: Physician Assistant | Admitting: Physician Assistant

## 2020-11-02 ENCOUNTER — Other Ambulatory Visit: Payer: Self-pay

## 2020-11-02 DIAGNOSIS — R590 Localized enlarged lymph nodes: Secondary | ICD-10-CM | POA: Diagnosis present

## 2020-11-06 ENCOUNTER — Other Ambulatory Visit: Payer: Self-pay

## 2020-11-06 ENCOUNTER — Inpatient Hospital Stay: Payer: Medicaid Other | Attending: Physician Assistant

## 2020-11-06 ENCOUNTER — Inpatient Hospital Stay (HOSPITAL_BASED_OUTPATIENT_CLINIC_OR_DEPARTMENT_OTHER): Payer: Medicaid Other | Admitting: Physician Assistant

## 2020-11-06 VITALS — BP 114/73 | HR 88 | Temp 98.2°F | Resp 18 | Ht 68.0 in | Wt 144.6 lb

## 2020-11-06 DIAGNOSIS — D649 Anemia, unspecified: Secondary | ICD-10-CM | POA: Insufficient documentation

## 2020-11-06 DIAGNOSIS — R591 Generalized enlarged lymph nodes: Secondary | ICD-10-CM | POA: Insufficient documentation

## 2020-11-06 DIAGNOSIS — R319 Hematuria, unspecified: Secondary | ICD-10-CM | POA: Diagnosis not present

## 2020-11-06 DIAGNOSIS — R809 Proteinuria, unspecified: Secondary | ICD-10-CM

## 2020-11-06 DIAGNOSIS — R590 Localized enlarged lymph nodes: Secondary | ICD-10-CM

## 2020-11-06 DIAGNOSIS — R5382 Chronic fatigue, unspecified: Secondary | ICD-10-CM | POA: Insufficient documentation

## 2020-11-06 DIAGNOSIS — R700 Elevated erythrocyte sedimentation rate: Secondary | ICD-10-CM | POA: Diagnosis not present

## 2020-11-06 DIAGNOSIS — R634 Abnormal weight loss: Secondary | ICD-10-CM | POA: Diagnosis not present

## 2020-11-06 DIAGNOSIS — D508 Other iron deficiency anemias: Secondary | ICD-10-CM

## 2020-11-06 DIAGNOSIS — E538 Deficiency of other specified B group vitamins: Secondary | ICD-10-CM

## 2020-11-06 LAB — CBC WITH DIFFERENTIAL (CANCER CENTER ONLY)
Abs Immature Granulocytes: 0.05 10*3/uL (ref 0.00–0.07)
Basophils Absolute: 0.1 10*3/uL (ref 0.0–0.1)
Basophils Relative: 1 %
Eosinophils Absolute: 0.2 10*3/uL (ref 0.0–0.5)
Eosinophils Relative: 3 %
HCT: 34.1 % — ABNORMAL LOW (ref 36.0–46.0)
Hemoglobin: 11 g/dL — ABNORMAL LOW (ref 12.0–15.0)
Immature Granulocytes: 1 %
Lymphocytes Relative: 34 %
Lymphs Abs: 2.5 10*3/uL (ref 0.7–4.0)
MCH: 28.9 pg (ref 26.0–34.0)
MCHC: 32.3 g/dL (ref 30.0–36.0)
MCV: 89.5 fL (ref 80.0–100.0)
Monocytes Absolute: 0.4 10*3/uL (ref 0.1–1.0)
Monocytes Relative: 6 %
Neutro Abs: 4.2 10*3/uL (ref 1.7–7.7)
Neutrophils Relative %: 55 %
Platelet Count: 312 10*3/uL (ref 150–400)
RBC: 3.81 MIL/uL — ABNORMAL LOW (ref 3.87–5.11)
RDW: 13.3 % (ref 11.5–15.5)
WBC Count: 7.5 10*3/uL (ref 4.0–10.5)
nRBC: 0 % (ref 0.0–0.2)

## 2020-11-06 LAB — CMP (CANCER CENTER ONLY)
ALT: 8 U/L (ref 0–44)
AST: 11 U/L — ABNORMAL LOW (ref 15–41)
Albumin: 3.5 g/dL (ref 3.5–5.0)
Alkaline Phosphatase: 60 U/L (ref 38–126)
Anion gap: 8 (ref 5–15)
BUN: 8 mg/dL (ref 6–20)
CO2: 31 mmol/L (ref 22–32)
Calcium: 9.3 mg/dL (ref 8.9–10.3)
Chloride: 105 mmol/L (ref 98–111)
Creatinine: 0.67 mg/dL (ref 0.44–1.00)
GFR, Estimated: >60 mL/min (ref >60)
Glucose, Bld: 80 mg/dL (ref 70–99)
Potassium: 3.5 mmol/L (ref 3.5–5.1)
Sodium: 144 mmol/L (ref 135–145)
Total Bilirubin: 0.7 mg/dL (ref 0.3–1.2)
Total Protein: 7.7 g/dL (ref 6.5–8.1)

## 2020-11-06 LAB — IRON AND TIBC
Iron: 38 ug/dL — ABNORMAL LOW (ref 41–142)
Saturation Ratios: 14 % — ABNORMAL LOW (ref 21–57)
TIBC: 266 ug/dL (ref 236–444)
UIBC: 229 ug/dL (ref 120–384)

## 2020-11-06 LAB — URINALYSIS, COMPLETE (UACMP) WITH MICROSCOPIC
Bilirubin Urine: NEGATIVE
Glucose, UA: NEGATIVE mg/dL
Ketones, ur: NEGATIVE mg/dL
Leukocytes,Ua: NEGATIVE
Nitrite: NEGATIVE
Protein, ur: 100 mg/dL — AB
RBC / HPF: >50 RBC/hpf — ABNORMAL HIGH (ref 0–5)
Specific Gravity, Urine: 1.023 (ref 1.005–1.030)
pH: 8 (ref 5.0–8.0)

## 2020-11-06 LAB — RETIC PANEL
Immature Retic Fract: 11 % (ref 2.3–15.9)
RBC.: 3.76 MIL/uL — ABNORMAL LOW (ref 3.87–5.11)
Retic Count, Absolute: 56.8 10*3/uL (ref 19.0–186.0)
Retic Ct Pct: 1.5 % (ref 0.4–3.1)
Reticulocyte Hemoglobin: 32.5 pg (ref >27.9)

## 2020-11-06 LAB — VITAMIN B12: Vitamin B-12: 155 pg/mL — ABNORMAL LOW (ref 180–914)

## 2020-11-06 LAB — FOLATE: Folate: 4.6 ng/mL — ABNORMAL LOW (ref >5.9)

## 2020-11-06 LAB — SEDIMENTATION RATE: Sed Rate: 50 mm/hr — ABNORMAL HIGH (ref 0–22)

## 2020-11-06 LAB — FERRITIN: Ferritin: 49 ng/mL (ref 11–307)

## 2020-11-06 NOTE — Progress Notes (Signed)
UA and Culture

## 2020-11-06 NOTE — Progress Notes (Signed)
[facility] [phone]   [phone]  HEMATOLOGY AND ONCOLOGY PROGRESS NOTE  Patient Care Team: Trisha MangleMorrison, Hayden Byrd, FNP as PCP - General (Family Medicine)  Hematological/Oncological History 1) 09/27/2020: Presented to ED due to chest pain x 1 week. CTA chest was obtained that revealed nonspecific bilateral borderline enlarged axillary lymph nodes. In addition, there were questionable bilateral supraclavicular lymph nodes.   2) 10/09/2020: Establish care with Georga KaufmannIrene Aldin Drees PA-C  3) 11/03/2020: Bilateral upper extremity US was negative for axillary adenopathy. US of head/neck shows single borderline enlarged 0.7 cm left cervical lymph node, nonspecific.   CHIEF COMPLAINTS: Borderline Enlarged Axillary Lymphadenopathy  HISTORY OF PRESENTING ILLNESS:  Angela Sexton 30 y.o. female who returns to the clinic for enlarged axillary lymphadenopathy. Patient is unaccompanied for this visit.   On exam today, Angela Sexton reports that energy levels are stable. She has chronic fatigue that has been present for the past 1-2  years. She continues to loose weight. She lost an additional 10-11 lbs in the past one month, which was unintentional. She notes that she has a poor appetite and eats usually one meal per day. She reports occasional episodes of nausea with vomiting. She reports having indigestion and intermittent episodes of epigastric pain. She notes have a regular menstrual cycle lasting approximately 4 days. She denies heavy menstrual bleeding. Patient notes intermittent episodes of dark brown/blood in the  urine. She denies dysuria or foul smelling urine. Her bowel movements are regular without diarrhea or constipation. She denies hematochezia or melena.  She denies any fevers, chills, drenching night sweats, shortness of breath, chest pain or cough. She has no other complaints.   MEDICAL HISTORY:  No past medical history on file.  SURGICAL HISTORY: No past  surgical history on file.  SOCIAL HISTORY: Social History   Socioeconomic History   Marital status: Single    Spouse name: Not on file   Number of children: Not on file   Years of education: Not on file   Highest education level: Not on file  Occupational History   Not on file  Tobacco Use   Smoking status: Never   Smokeless tobacco: Never  Substance and Sexual Activity   Alcohol use: Never   Drug use: Never   Sexual activity: Not on file  Other Topics Concern   Not on file  Social History Narrative   Not on file   Social Determinants of Health   Financial Resource Strain: Not on file  Food Insecurity: Not on file  Transportation Needs: Not on file  Physical Activity: Not on file  Stress: Not on file  Social Connections: Not on file  Intimate Partner Violence: Not on file    FAMILY HISTORY: Family History  Problem Relation Age of Onset   Scleroderma Father    Melanoma Maternal Grandfather    Lung disease Neg Hx     ALLERGIES:  is allergic to sulfa antibiotics.  MEDICATIONS:  Current Outpatient Medications  Medication Sig Dispense Refill   ferrous sulfate 325 (65 FE) MG EC tablet Take 1 tablet (325 mg total) by mouth daily with breakfast. 3 tablet 3   folic acid (FOLVITE) 1 MG tablet Take 1 tablet (1 mg total) by mouth daily. 30 tablet 3   vitamin B-12 (CYANOCOBALAMIN) 1000 MCG tablet Take 1 tablet (1,000 mcg total) by mouth daily. 30 tablet 3   methocarbamol (ROBAXIN) 500 MG tablet Take 1 tablet (500 mg total) by mouth 2 (two) times daily. 20 tablet  0   No current facility-administered medications for this visit.    REVIEW OF SYSTEMS:   Constitutional: ( - ) fevers, ( - )  chills , ( - ) night sweats Eyes: ( - ) blurriness of vision, ( - ) double vision, ( - ) watery eyes Ears, nose, mouth, throat, and face: ( - ) mucositis, ( - ) sore throat Respiratory: ( - ) cough, ( - ) dyspnea, ( - ) wheezes Cardiovascular: ( - ) palpitation, ( - ) chest discomfort,  ( -) lower extremity swelling Gastrointestinal:  ( + ) nausea, ( +) heartburn, ( - ) change in bowel habits Skin: ( - ) abnormal skin rashes Lymphatics: ( - ) new lymphadenopathy, ( - ) easy bruising Neurological: ( - ) numbness, ( - ) tingling, ( - ) new weaknesses Behavioral/Psych: ( - ) mood change, ( - ) new changes  All other systems were reviewed with the patient and are negative.  PHYSICAL EXAMINATION: ECOG PERFORMANCE STATUS: 1 - Symptomatic but completely ambulatory  Vitals:   11/06/20 1309  BP: 114/73  Pulse: 88  Resp: 18  Temp: 98.2 F (36.8 C)  SpO2: 100%   Filed Weights   11/06/20 1309  Weight: 144 lb 9.6 oz (65.6 kg)    GENERAL: well appearing African American female in NAD  SKIN: skin color, texture, turgor are normal, no rashes or significant lesions EYES: conjunctiva are pink and non-injected, sclera clear OROPHARYNX: no exudate, no erythema; lips, buccal mucosa, and tongue normal  NECK: supple, non-tender LYMPH:  no palpable lymphadenopathy in the cervical, axillary or supraclavicular lymph nodes.  LUNGS: clear to auscultation and percussion with normal breathing effort HEART: regular rate & rhythm and no murmurs.  ABDOMEN: soft, non-tender, non-distended, normal bowel sounds Musculoskeletal: no cyanosis of digits and no clubbing  PSYCH: alert & oriented x 3, fluent speech NEURO: no focal motor/sensory deficits  LABORATORY DATA:  I have reviewed the data as listed CBC Latest Ref Rng & Units 11/06/2020 10/09/2020 09/27/2020  WBC 4.0 - 10.5 K/uL 7.5 7.6 7.7  Hemoglobin 12.0 - 15.0 g/dL 11.0(L) 10.5(L) 11.8(L)  Hematocrit 36.0 - 46.0 % 34.1(L) 32.7(L) 36.7  Platelets 150 - 400 K/uL 312 321 328    CMP Latest Ref Rng & Units 11/06/2020 10/09/2020 09/27/2020  Glucose 70 - 99 mg/dL 80 86 86  BUN 6 - 20 mg/dL 8 7 6   Creatinine 0.44 - 1.00 mg/dL 3.53 6.14  Sodium 135 - 145 mmol/L 144 141 140  Potassium 3.5 - 5.1 mmol/L 3.5 3.7 3.9  Chloride 98 - 111 mmol/L  105 105 103  CO2 22 - 32 mmol/L 31 30 30   Calcium 8.9 - 10.3 mg/dL 9.3 9.0 9.2  Total Protein 6.5 - 8.1 g/dL 7.7 7.7 7.6  Total Bilirubin 0.3 - 1.2 mg/dL 0.7 0.5 0.6  Alkaline Phos 38 - 126 U/L 60 53 53  AST 15 - 41 U/L 11(L) 14(L) 21  ALT 0 - 44 U/L 8 12 11     RADIOGRAPHIC STUDIES: I have personally reviewed the radiological images as listed and agreed with the findings in the report. 4.31 Soft Tissue Head/Neck  Result Date: 11/03/2020 CLINICAL DATA:  Supraclavicular adenopathy noted on CTA chest EXAM: ULTRASOUND OF HEAD/NECK SOFT TISSUES TECHNIQUE: Ultrasound examination of the head and neck soft tissues was performed in the area of clinical concern. COMPARISON:  CTA 09/27/2020 FINDINGS: Limited images of the thyroid unremarkable. On the left, prominent cervical lymph node measures 0.7 cm  short axis diameter. No supraclavicular adenopathy demonstrated. On the right, morphologically unremarkable right cervical lymph node measures 0.5 cm short axis diameter. No supraclavicular adenopathy demonstrated. IMPRESSION: 1. No evidence of supraclavicular adenopathy. 2. Single borderline enlarged left cervical lymph node, nonspecific. Electronically Signed   By: Corlis Leak M.D.   On: 11/03/2020 12:51   [place] RT UPPER EXTREM LTD SOFT TISSUE NON VASCULAR  Result Date: 11/03/2020 CLINICAL DATA:  Adenopathy suspected on recent CTA EXAM: ULTRASOUND RIGHT UPPER EXTREMITY LIMITED TECHNIQUE: Ultrasound examination of the upper extremity soft tissues was performed in the area of clinical concern. COMPARISON:  CTA 09/27/2020 FINDINGS: No axillary adenopathy localized. IMPRESSION: Negative for right axillary adenopathy. Electronically Signed   By: Corlis Leak M.D.   On: 11/03/2020 12:51   [place] LT UPPER EXTREM LTD SOFT TISSUE NON VASCULAR  Result Date: 11/03/2020 CLINICAL DATA:  Left axillary adenopathy suspected on CTA chest EXAM: ULTRASOUND LEFT UPPER EXTREMITY LIMITED TECHNIQUE: Ultrasound examination of the upper  extremity soft tissues was performed in the area of clinical concern. COMPARISON:  CTA 09/27/2020 FINDINGS: No left axillary adenopathy localized. IMPRESSION: Negative for left axillary adenopathy. Electronically Signed   By: Corlis Leak M.D.   On: 11/03/2020 12:52    ASSESSMENT & PLAN Cadey Roedel is a 30 y.o. female presenting to the clinic for axillary lymphadenopathy with questionable supraclavicular lymphadenopathy.  #Bilateral axillary lymphadenopathy and questionable bilateral supraclavicular lymphadenopathy: --Etiology unknown. Seen on CTA chest from 09/27/2020 --Patient denies recent COVID vaccination/booster. She denies any breast symptoms.  --[place] of upper extremities showed no axillary lymphadenopathy. [place] head/neck showed no supraclavicular lymphadenopathy. There is a nonspecific single borderline enlarged left cervical node measuring 0.7 cm.  --Labs from 10/09/2020 showed elevated sed rate at 60, normal LDH levels. Liver and renal function was intact. CBC showed normocytic anemia with hemoglobin at 10.5  --Repeat labs from today shows sed rate is still elevated at 50. So I will send referral to rheumatology to further evaluate.   #Central chest pain: --Chronic but etiology known.  --Patient has been evaluated by pulmonology in October/November 2021 and did not feel chest pain was pulmonary process.  --Troponin levels from 09/27/2020 was not elevated.  --Request PCP to follow up if above workup is negative.   #Normocytic Anemia: --Hemoglobin level was 11.0 today.  --There is evidence of iron deficiency with iron saturation at 14%, iron 38. Recommend to start ferrous sulfate 325 mg once daily. Advised to take with a source of vitamin C and avoid taking concurrently with calcium/antiacids --There is evidence of folate deficiency with folate level at 4.6. Recommend to start folic acid 1 mg once daily --There is evidence of vitamin B12 deficiency with B12 level at 155. Recommend to start  vitamin B12 1000 mcg once daily. --Concerned about malnutrition versus malabsorptive disorder. I will send a referral to GI to further evaluate. Discussed   #Hematuria with proteinuria: --UA from today shows proteinuria 100 mg/dL and >82 RBC/HPF.  --Based on above results and patient reporting hematuria, I will send a referral to urology to further evaluate.   #Weight loss: --Patient is only eating one meal a day due to poor appetite. I advised patient to follow up with her PCP to further evaluate.   Orders Placed This Encounter  Procedures   Ambulatory referral to Rheumatology    Referral Priority:   Routine    Referral Type:   Consultation    Referral Reason:   Specialty Services Required    Requested Specialty:  Rheumatology    Number of Visits Requested:   1   Ambulatory referral to Urology    Referral Priority:   Routine    Referral Type:   Consultation    Referral Reason:   Specialty Services Required    Requested Specialty:   Urology    Number of Visits Requested:   1   Ambulatory referral to Gastroenterology    Referral Priority:   Routine    Referral Type:   Consultation    Referral Reason:   Specialty Services Required    Number of Visits Requested:   1     All questions were answered. The patient knows to call the clinic with any problems, questions or concerns.  I have spent a total of 45 minutes minutes of face-to-face and non-face-to-face time, preparing to see the patient, obtaining and/or reviewing separately obtained history, performing a medically appropriate examination, counseling and educating the patient, ordering medications/tests, referring and communicating with other health care professionals, documenting clinical information in the electronic health record, and care coordination.    Georga Kaufmann, PA-C Department of Hematology/Oncology [facility] Phone: [phone]

## 2020-11-07 LAB — URINE CULTURE: Culture: NO GROWTH

## 2020-11-09 ENCOUNTER — Other Ambulatory Visit: Payer: Self-pay | Admitting: Physician Assistant

## 2020-11-09 ENCOUNTER — Telehealth: Payer: Self-pay | Admitting: Physician Assistant

## 2020-11-09 DIAGNOSIS — R319 Hematuria, unspecified: Secondary | ICD-10-CM

## 2020-11-09 DIAGNOSIS — D509 Iron deficiency anemia, unspecified: Secondary | ICD-10-CM

## 2020-11-09 DIAGNOSIS — E538 Deficiency of other specified B group vitamins: Secondary | ICD-10-CM

## 2020-11-09 DIAGNOSIS — R809 Proteinuria, unspecified: Secondary | ICD-10-CM

## 2020-11-09 DIAGNOSIS — R700 Elevated erythrocyte sedimentation rate: Secondary | ICD-10-CM | POA: Insufficient documentation

## 2020-11-09 HISTORY — DX: Hematuria, unspecified: R80.9

## 2020-11-09 HISTORY — DX: Hematuria, unspecified: R31.9

## 2020-11-09 HISTORY — DX: Deficiency of other specified B group vitamins: E53.8

## 2020-11-09 HISTORY — DX: Iron deficiency anemia, unspecified: D50.9

## 2020-11-09 HISTORY — DX: Elevated erythrocyte sedimentation rate: R70.0

## 2020-11-09 MED ORDER — FERROUS SULFATE 325 (65 FE) MG PO TBEC: 325.0000 mg | DELAYED_RELEASE_TABLET | Freq: Every day | ORAL | 3 refills | Status: DC

## 2020-11-09 MED ORDER — VITAMIN B-12 1000 MCG PO TABS: 1000.0000 ug | ORAL_TABLET | Freq: Every day | ORAL | 3 refills | Status: DC

## 2020-11-09 MED ORDER — FOLIC ACID 1 MG PO TABS: 1.0000 mg | ORAL_TABLET | Freq: Every day | ORAL | 3 refills | Status: DC

## 2020-11-09 NOTE — Telephone Encounter (Signed)
Scheduled per los. Called and left msg. Mailed printout  °

## 2020-11-14 ENCOUNTER — Telehealth: Payer: Self-pay

## 2020-11-14 NOTE — Telephone Encounter (Signed)
Per Georga Kaufmann, PA I have sent three referrals to [facility], Dr. Corliss Skains, and [facility]. I sent copies of the patient's most recent OV notes, Demographic/insurance information, and most recent lab results with a receipt of confirmation from all three facilities.

## 2020-12-07 DIAGNOSIS — R21 Rash and other nonspecific skin eruption: Secondary | ICD-10-CM | POA: Insufficient documentation

## 2020-12-07 HISTORY — DX: Rash and other nonspecific skin eruption: R21

## 2020-12-21 ENCOUNTER — Telehealth: Payer: Self-pay | Admitting: Dietician

## 2020-12-21 NOTE — Telephone Encounter (Signed)
Nutrition  Patient identified on Malnutrition Screening Report (+weight loss, poor appetite)  Briefly spoke with patient via telephone. Introduced self and services available at [facility]. Patient reports ongoing poor appetite, eats one small meal daily and continues to lose weight. Patient reports she was told by MD she may have Lupus, but it has not been confirmed. She reports she is currently in [place] and would like to schedule nutrition appointment via telephone when she returns home. Patient agreeable to 9/16 telephone appointment.

## 2020-12-24 NOTE — Progress Notes (Deleted)
Office Visit Note  Patient: Angela Sexton             Date of Birth: 30-Jun-1990           MRN: [id number]             PCP: Joya Gaskins, FNP Referring: Lincoln Brigham, PA-C Visit Date: 12/25/2020 Occupation: @GUAROCC @  Subjective:  No chief complaint on file.   History of Present Illness: Angela Sexton is a 30 y.o. female here for elevated sedimentation rate with systemic symptoms including fatigue, unexplained weight loss, macroscopic hematuria, axillary and supraclavicular lymphadenopathy.***    Labs reviewed 10/2020 ESR 50 CBC Hgb 11.0 CMP unremarkable Iron 38 Sat 14% Ferritin 49 UA protein 100 RBCs >50 Urine culture neg Vit B12 155 Folate 4.6  01/2020 HAV/HBV/HCV neg HIV neg RPR neg   09/27/20 CTA Chest IMPRESSION: Nonspecific bilateral borderline enlarged axillary lymph nodes. Question bilateral supraclavicular lymph nodes with markedly limited evaluation due to streak artifact originating from external jewelry. 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   Activities of Daily Living:  Patient reports morning stiffness for *** {minute/hour:19697}.   Patient {ACTIONS;DENIES/REPORTS:21021675::"Denies"} nocturnal pain.  Difficulty dressing/grooming: {ACTIONS;DENIES/REPORTS:21021675::"Denies"} Difficulty climbing stairs: {ACTIONS;DENIES/REPORTS:21021675::"Denies"} Difficulty getting out of chair: {ACTIONS;DENIES/REPORTS:21021675::"Denies"} Difficulty using hands for taps, buttons, cutlery, and/or writing: {ACTIONS;DENIES/REPORTS:21021675::"Denies"}  No Rheumatology ROS completed.   PMFS History:  Patient Active Problem List   Diagnosis Date Noted   Elevated sed rate 11/09/2020   B12 deficiency 11/09/2020   Iron deficiency anemia 11/09/2020   Folate deficiency 11/09/2020   Hematuria with proteinuria 11/09/2020   Axillary lymphadenopathy 10/09/2020    No past medical history on file.  Family History  Problem Relation Age of Onset    Scleroderma Father    Melanoma Maternal Grandfather    Lung disease Neg Hx    No past surgical history on file. Social History   Social History Narrative   Not on file   Immunization History  Administered Date(s) Administered   PFIZER(Purple Top)SARS-COV-2 Vaccination 06/28/2019, 07/29/2019     Objective: Vital Signs: There were no vitals taken for this visit.   Physical Exam   Musculoskeletal Exam: ***  CDAI Exam: CDAI Score: -- Patient Global: --; Provider Global: -- Swollen: --; Tender: -- Joint Exam 12/25/2020   No joint exam has been documented for this visit   There is currently no information documented on the homunculus. Go to the Rheumatology activity and complete the homunculus joint exam.  Investigation: No additional findings.  Imaging: No results found.  Recent Labs: Lab Results  Component Value Date   WBC 7.5 11/06/2020   HGB 11.0 (L) 11/06/2020   PLT 312 11/06/2020   NA 144 11/06/2020   K 3.5 11/06/2020   CL 105 11/06/2020   CO2 31 11/06/2020   GLUCOSE 80 11/06/2020   BUN 8 11/06/2020   CREATININE 0.67 11/06/2020   BILITOT 0.7 11/06/2020   ALKPHOS 60 11/06/2020   AST 11 (L) 11/06/2020   ALT 8 11/06/2020   PROT 7.7 11/06/2020   ALBUMIN 3.5 11/06/2020   CALCIUM 9.3 11/06/2020    Speciality Comments: No specialty comments available.  Procedures:  No procedures performed Allergies: Sulfa antibiotics   Assessment / Plan:     Visit Diagnoses: No diagnosis found.  Orders: No orders of the defined types were placed in this encounter.  No orders of the defined types were placed in this encounter.   Face-to-face time spent with patient was *** minutes. Greater than 50% of time  was spent in counseling and coordination of care.  Follow-Up Instructions: No follow-ups on file.   Collier Salina, MD  Note - This record has been created using [employer].  Chart creation errors have been sought, but may not always  have been  located. Such creation errors do not reflect on  the standard of medical care.

## 2020-12-25 ENCOUNTER — Ambulatory Visit: Payer: Medicaid Other | Admitting: Internal Medicine

## 2021-01-04 ENCOUNTER — Inpatient Hospital Stay: Payer: Medicaid Other | Attending: Physician Assistant | Admitting: Dietician

## 2021-01-04 ENCOUNTER — Telehealth: Payer: Self-pay | Admitting: Dietician

## 2021-01-04 NOTE — Telephone Encounter (Signed)
Nutrition   Patient did not answer for scheduled nutrition telephone visit this morning. Unable to leave message, VM not accepting new messages at this time.

## 2021-01-19 DIAGNOSIS — M2550 Pain in unspecified joint: Secondary | ICD-10-CM | POA: Insufficient documentation

## 2021-01-19 HISTORY — DX: Pain in unspecified joint: M25.50

## 2021-02-04 ENCOUNTER — Other Ambulatory Visit: Payer: Self-pay | Admitting: Physician Assistant

## 2021-02-04 DIAGNOSIS — E538 Deficiency of other specified B group vitamins: Secondary | ICD-10-CM

## 2021-02-04 DIAGNOSIS — D508 Other iron deficiency anemias: Secondary | ICD-10-CM

## 2021-02-04 DIAGNOSIS — R700 Elevated erythrocyte sedimentation rate: Secondary | ICD-10-CM

## 2021-02-05 ENCOUNTER — Inpatient Hospital Stay: Payer: Medicaid Other | Attending: Physician Assistant | Admitting: Physician Assistant

## 2021-02-05 ENCOUNTER — Inpatient Hospital Stay: Payer: Medicaid Other

## 2021-02-05 DIAGNOSIS — I7300 Raynaud's syndrome without gangrene: Secondary | ICD-10-CM

## 2021-02-05 HISTORY — DX: Raynaud's syndrome without gangrene: I73.00

## 2021-03-12 NOTE — Progress Notes (Deleted)
Office Visit Note  Patient: Angela Sexton             Date of Birth: 1991/03/26           MRN: [id number]             PCP: Joya Gaskins, FNP Referring: Lincoln Brigham, PA-C Visit Date: 03/13/2021 Occupation: @GUAROCC @  Subjective:  No chief complaint on file.   History of Present Illness: Angela Sexton is a 30 y.o. female here for evaluation of elevated sedimentation rate and bilateral cervical lymphadenopathy initially started after ED visit for central chest pain with adenopathy on CT imaging. This started around June as a new problem. Subsequent follow up with oncology but no lymph node biopsy was preformed due to resolution of superficial enlarged nodes.***   Labs reviewed 10/2020 ESR 50 CBC Hgb 11.0 CMP AST 11 UA protein 100 RBCs >50 Iron 38 Sat 14% Vit B12 155 Folate 4.6  09/2020 ESR 60  Imaging reviewed 10/07/20 CT Angio Chest Nonspecific bilateral borderline enlarged axillary lymph nodes. Question bilateral supraclavicular lymph nodes with markedly limited evaluation due to streak artifact originating from external jewelry. 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   Activities of Daily Living:  Patient reports morning stiffness for *** {minute/hour:19697}.   Patient {ACTIONS;DENIES/REPORTS:21021675::"Denies"} nocturnal pain.  Difficulty dressing/grooming: {ACTIONS;DENIES/REPORTS:21021675::"Denies"} Difficulty climbing stairs: {ACTIONS;DENIES/REPORTS:21021675::"Denies"} Difficulty getting out of chair: {ACTIONS;DENIES/REPORTS:21021675::"Denies"} Difficulty using hands for taps, buttons, cutlery, and/or writing: {ACTIONS;DENIES/REPORTS:21021675::"Denies"}  No Rheumatology ROS completed.   PMFS History:  Patient Active Problem List   Diagnosis Date Noted   Elevated sed rate 11/09/2020   B12 deficiency 11/09/2020   Iron deficiency anemia 11/09/2020   Folate deficiency 11/09/2020   Hematuria with proteinuria 11/09/2020   Axillary  lymphadenopathy 10/09/2020    No past medical history on file.  Family History  Problem Relation Age of Onset   Scleroderma Father    Melanoma Maternal Grandfather    Lung disease Neg Hx    No past surgical history on file. Social History   Social History Narrative   Not on file   Immunization History  Administered Date(s) Administered   PFIZER(Purple Top)SARS-COV-2 Vaccination 06/28/2019, 07/29/2019     Objective: Vital Signs: There were no vitals taken for this visit.   Physical Exam   Musculoskeletal Exam: ***  CDAI Exam: CDAI Score: -- Patient Global: --; Provider Global: -- Swollen: --; Tender: -- Joint Exam 03/13/2021   No joint exam has been documented for this visit   There is currently no information documented on the homunculus. Go to the Rheumatology activity and complete the homunculus joint exam.  Investigation: No additional findings.  Imaging: No results found.  Recent Labs: Lab Results  Component Value Date   WBC 7.5 11/06/2020   HGB 11.0 (L) 11/06/2020   PLT 312 11/06/2020   NA 144 11/06/2020   K 3.5 11/06/2020   CL 105 11/06/2020   CO2 31 11/06/2020   GLUCOSE 80 11/06/2020   BUN 8 11/06/2020   CREATININE 0.67 11/06/2020   BILITOT 0.7 11/06/2020   ALKPHOS 60 11/06/2020   AST 11 (L) 11/06/2020   ALT 8 11/06/2020   PROT 7.7 11/06/2020   ALBUMIN 3.5 11/06/2020   CALCIUM 9.3 11/06/2020    Speciality Comments: No specialty comments available.  Procedures:  No procedures performed Allergies: Sulfa antibiotics   Assessment / Plan:     Visit Diagnoses: No diagnosis found.  Orders: No orders of the defined types were placed in this encounter.  No  orders of the defined types were placed in this encounter.   Face-to-face time spent with patient was *** minutes. Greater than 50% of time was spent in counseling and coordination of care.  Follow-Up Instructions: No follow-ups on file.   Collier Salina, MD  Note - This  record has been created using [employer].  Chart creation errors have been sought, but may not always  have been located. Such creation errors do not reflect on  the standard of medical care.

## 2021-03-13 ENCOUNTER — Ambulatory Visit: Payer: Medicaid Other | Admitting: Internal Medicine

## 2021-03-27 ENCOUNTER — Encounter: Payer: Self-pay | Admitting: Gastroenterology

## 2021-04-03 ENCOUNTER — Ambulatory Visit: Payer: Medicaid Other | Admitting: Gastroenterology

## 2021-04-08 ENCOUNTER — Encounter: Payer: Self-pay | Admitting: Physician Assistant

## 2021-04-21 NOTE — L&D Delivery Note (Signed)
Delivery Note ?Pt continued to labor and progressed to fully dilated.  After a brief 2nd stage, at 12:53 AM a viable female was delivered via Vaginal, Spontaneous (Presentation: OA, under water.  Baby brought out of water and placed on mom's chest immediately after delivery).  APGAR: 8, 9; weight pending.  After 7 minutes, the cord was clamped and cut. 20 units of pitocin diluted in 500cc LR was infused rapidly IV.  The placenta separated spontaneously and delivered via CCT and maternal pushing effort.  It was inspected and appears to be intact with a 3 VC.  ?Anesthesia: None ?Episiotomy: None ?Lacerations: None ?Suture Repair:  ?Est. Blood Loss (mL): 122 ? ?Mom to postpartum.  Baby to Couplet care / Skin to Skin. ? ?Christin Fudge ?08/23/2021, 1:37 AM ? ? ?

## 2021-04-29 DIAGNOSIS — Z3490 Encounter for supervision of normal pregnancy, unspecified, unspecified trimester: Secondary | ICD-10-CM

## 2021-04-29 DIAGNOSIS — O0990 Supervision of high risk pregnancy, unspecified, unspecified trimester: Secondary | ICD-10-CM | POA: Insufficient documentation

## 2021-04-29 NOTE — Progress Notes (Signed)
Office Visit Note  Patient: Angela Sexton             Date of Birth: [birth year]           MRN: [id number]             PCP: Joya Gaskins, FNP Referring: Lincoln Brigham, PA-C Visit Date: 04/30/2021 Occupation: Home maker, model  Subjective:  New Patient (Initial Visit) (Lupus?)   History of Present Illness: Angela Sexton is a 31 y.o. female here for evaluation of elevated sedimentation rate and axillary lymphadenopathy.  She has been having intermittent symptoms including joint pains, fatigue, skin rashes, persistent hematuria, and chest pains ongoing for a few years but everything much worse during the past year.  She was seen in the emergency department a few times for the severe chest pain with CT angiography negative for any blood clots but demonstrating some cervical adenopathy.  Lab work-up showed some normocytic anemia, vitamin B12 and folate deficiency, and elevated sed rate of 50.  Ultrasound of the neck demonstrated mildly enlarged cervical lymph node without supraclavicular lymph node enlargement.  She has had some joint pains most often worse in the shoulders but also affecting other areas sometimes with visible swelling.  She has a history of eczema which is usually been limited to the trunk or extremities but last summer developed new facial rash over the cheeks.  She experienced right-sided hair loss and thinning.  She had worsening dry mouth symptoms developed a few ulcers on the top and right cheek of the mouth.  Many of her symptoms are decreased in severity now compared to last summer.  She is now [redacted] weeks pregnant.  During pregnancy a new problem has been some issues with extremities going to sleep easily after even mild amounts of pressure. She has 1 daughter with an uncomplicated pregnancy 8 years ago.  She denies any history of blood clots.  Work-up for chest pain has been unremarkable of abnormal cardiac or CT chest findings last year.   Labs reviewed 10/2020 ESR  50 CBC Hgb 11.0 CMP unremarkable Iron 38 sat 14% B12 155 Folate 4.6 UA +protein +hgb +bacteria  Imaging reviewed 11/02/20 [place] Head and neck IMPRESSION: 1. No evidence of supraclavicular adenopathy. 2. Single borderline enlarged left cervical lymph node, nonspecific.  6/19 CTA Chest IMPRESSION: Nonspecific bilateral borderline enlarged axillary lymph nodes. Question bilateral supraclavicular lymph nodes with markedly limited evaluation due to streak artifact originating from external jewelry. 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  Activities of Daily Living:  Patient reports morning stiffness for 24 hours.   Patient Reports nocturnal pain.  Difficulty dressing/grooming: Reports Difficulty climbing stairs: Denies Difficulty getting out of chair: Denies Difficulty using hands for taps, buttons, cutlery, and/or writing: Reports  Review of Systems  Constitutional:  Positive for fatigue and weight loss.  HENT:  Positive for mouth sores and mouth dryness.   Eyes:  Positive for dryness.  Respiratory:  Positive for shortness of breath.   Cardiovascular:  Positive for swelling in legs/feet.  Gastrointestinal:  Positive for constipation.  Endocrine: Positive for cold intolerance, heat intolerance, excessive thirst and increased urination.  Genitourinary:  Negative for difficulty urinating.  Musculoskeletal:  Positive for joint pain, joint pain, joint swelling, muscle weakness and morning stiffness.  Skin:  Positive for rash.  Allergic/Immunologic: Positive for susceptible to infections.  Neurological:  Positive for numbness and weakness.  Hematological:  Positive for bruising/bleeding tendency.  Psychiatric/Behavioral:  Positive for sleep disturbance.  PMFS History:  Patient Active Problem List   Diagnosis Date Noted   Indication for care in labor or delivery 08/22/2021   Uterine contractions during pregnancy 08/22/2021   Hx of maternal laceration, 4th degree,  currently pregnant 06/05/2021   Positive ANA (antinuclear antibody) 69/67/8938   Syphilis complicating pregnancy, third trimester 05/09/2021   Family history of scleroderma 05/08/2021   Supervision of high risk pregnancy, antepartum 04/29/2021   Raynaud phenomenon 02/05/2021   Joint pain 01/19/2021   Facial rash 12/07/2020   Elevated sed rate 11/09/2020   B12 deficiency 11/09/2020   Iron deficiency anemia 11/09/2020   Folate deficiency 11/09/2020   Hematuria with proteinuria 11/09/2020   Axillary lymphadenopathy 10/09/2020   Eczema 10/05/2014   Bronchogenic cyst 04/21/2014    Past Medical History:  Diagnosis Date   Eczema     Family History  Problem Relation Age of Onset   Asthma Mother    Eczema Mother    Scleroderma Father    Melanoma Maternal Grandfather    Lung disease Neg Hx    Past Surgical History:  Procedure Laterality Date   WISDOM TOOTH EXTRACTION     Social History   Social History Narrative   Not on file   Immunization History  Administered Date(s) Administered   DTaP 01/16/1993, 05/01/1993, 07/10/1993, 05/01/1994, 05/14/1994, 01/17/1995, 11/22/1996   HIB (PRP-T) 01/16/1993, 05/01/1993, 07/15/1993, 05/14/1994   Hepatitis A, Ped/Adol-2 Dose 08/08/2010   Hepatitis B, PED/ADOLESCENT 11/27/1992, 01/16/1993, 11/20/1993   IPV 01/16/1993, 05/01/1993, 07/10/1993, 05/14/1994   MMR 11/20/1993, 11/22/1996   Meningococcal Conjugate 05/19/2007   PFIZER Comirnaty(Gray Top)Covid-19 Tri-Sucrose Vaccine 06/28/2019, 07/29/2019   PFIZER(Purple Top)SARS-COV-2 Vaccination 06/28/2019, 07/29/2019   Tdap 01/06/2005, 08/25/2021     Objective: Vital Signs: BP 103/69 (BP Location: Right Arm, Patient Position: Sitting, Cuff Size: Normal)   Pulse 81   Resp 15   Ht 5' 8"  (1.727 m)   Wt 171 lb (77.6 kg)   LMP 11/25/2020   BMI 26.00 kg/m    Physical Exam HENT:     Right Ear: External ear normal.     Left Ear: External ear normal.     Nose: Nose normal.     Mouth/Throat:      Mouth: Mucous membranes are moist.     Pharynx: Oropharynx is clear.  Eyes:     Conjunctiva/sclera: Conjunctivae normal.  Cardiovascular:     Rate and Rhythm: Normal rate and regular rhythm.  Pulmonary:     Effort: Pulmonary effort is normal.     Breath sounds: Normal breath sounds.  Skin:    General: Skin is warm and dry.     Findings: No rash.     Comments: Normal nailfold capillaroscopy No digital pitting  Neurological:     General: No focal deficit present.     Mental Status: She is alert.  Psychiatric:        Mood and Affect: Mood normal.      Musculoskeletal Exam:  Shoulders full ROM no tenderness or swelling Elbows full ROM no tenderness or swelling Wrists full ROM no tenderness or swelling Fingers full ROM no tenderness or swelling Lateral hip pain provoked with FADIR and FABER maneuver worse on right, no lateral hip tenderness to palpation Knees full ROM no tenderness or swelling Ankles full ROM no tenderness or swelling MTPs full ROM no tenderness or swelling   Investigation: No additional findings.  Imaging: DG Finger Little Right  Result Date: 12/25/2021 CLINICAL DATA:  Right fifth finger pain after injury  yesterday. EXAM: RIGHT LITTLE FINGER 2+V COMPARISON:  None Available. FINDINGS: Normal bone mineralization. Joint spaces are preserved. No acute fracture is seen. No dislocation. IMPRESSION: Normal right fifth finger radiographs. Electronically Signed   By: Yvonne Kendall M.D.   On: 12/25/2021 09:09    Recent Labs: Lab Results  Component Value Date   WBC 8.1 08/24/2021   HGB 10.0 (L) 08/24/2021   PLT 225 08/24/2021   NA 144 11/06/2020   K 3.5 11/06/2020   CL 105 11/06/2020   CO2 31 11/06/2020   GLUCOSE 80 11/06/2020   BUN 8 11/06/2020   CREATININE 0.67 11/06/2020   BILITOT 0.7 11/06/2020   ALKPHOS 60 11/06/2020   AST 11 (L) 11/06/2020   ALT 8 11/06/2020   PROT 7.7 11/06/2020   ALBUMIN 3.5 11/06/2020   CALCIUM 9.3 11/06/2020    Speciality  Comments: No specialty comments available.  Procedures:  No procedures performed Allergies: Sulfa antibiotics   Assessment / Plan:     Visit Diagnoses: Elevated sed rate Axillary lymphadenopathy - Plan: Sedimentation rate, ANA, Sjogrens syndrome-A extractable nuclear antibody, Anti-DNA antibody, double-stranded, C3 and C4, Protein / creatinine ratio, urine, Anti-Smith antibody  Elevated sed rate and some nonspecific inflammatory symptoms.  Some cervical lymphadenopathy present erythematous skin rashes and joint pains many could be consistent with lupus or other connective tissue disease but no specific serologic findings so far..  We will check ANA antibodies as well as double-stranded DNA serum complements.  Screening urine protein creatinine ratio.  We will repeat the sedimentation rate see if there is significant change compared to previous.  Not sure if symptoms are less severe due to decrease in exposure with seasonal affect or possible decrease in autoimmune disease activity during pregnancy.  Facial rash  Not severe currently she did report onset mostly last year in the summertime which could be typical for a photosensitive dermatitis such as acute cutaneous lupus.  Arthralgia, unspecified joint  Pain is currently worst around hips localizes more to lateral areas suggesting muscular tenderness or bursitis problem.  No inflammatory arthritis changes present on exam today.  Orders: Orders Placed This Encounter  Procedures   Sedimentation rate   ANA   Sjogrens syndrome-A extractable nuclear antibody   Anti-DNA antibody, double-stranded   C3 and C4   Protein / creatinine ratio, urine   Anti-Smith antibody   No orders of the defined types were placed in this encounter.    Follow-Up Instructions: Return in about 2 weeks (around 05/14/2021) for New pt +ESR/arthralgia/adenopathy f/u.   Collier Salina, MD  Note - This record has been created using [employer].  Chart  creation errors have been sought, but may not always  have been located. Such creation errors do not reflect on  the standard of medical care.

## 2021-04-30 ENCOUNTER — Encounter: Payer: Self-pay | Admitting: Internal Medicine

## 2021-04-30 ENCOUNTER — Ambulatory Visit (INDEPENDENT_AMBULATORY_CARE_PROVIDER_SITE_OTHER): Payer: Medicaid Other | Admitting: Internal Medicine

## 2021-04-30 ENCOUNTER — Other Ambulatory Visit: Payer: Self-pay

## 2021-04-30 VITALS — BP 103/69 | HR 81 | Resp 15 | Ht 68.0 in | Wt 171.0 lb

## 2021-04-30 DIAGNOSIS — M2550 Pain in unspecified joint: Secondary | ICD-10-CM

## 2021-04-30 DIAGNOSIS — R590 Localized enlarged lymph nodes: Secondary | ICD-10-CM | POA: Diagnosis not present

## 2021-04-30 DIAGNOSIS — R700 Elevated erythrocyte sedimentation rate: Secondary | ICD-10-CM

## 2021-04-30 DIAGNOSIS — R21 Rash and other nonspecific skin eruption: Secondary | ICD-10-CM | POA: Diagnosis not present

## 2021-05-02 LAB — ANTI-DNA ANTIBODY, DOUBLE-STRANDED: ds DNA Ab: <1 IU/mL

## 2021-05-02 LAB — PROTEIN / CREATININE RATIO, URINE
Creatinine, Urine: 102 mg/dL (ref 20–275)
Protein/Creat Ratio: 137 mg/g creat (ref 24–184)
Protein/Creatinine Ratio: 0.137 mg/mg creat (ref 0.024–0.184)
Total Protein, Urine: 14 mg/dL (ref 5–24)

## 2021-05-02 LAB — ANA: Anti Nuclear Antibody (ANA): NEGATIVE

## 2021-05-02 LAB — C3 AND C4
C3 Complement: 150 mg/dL (ref 83–193)
C4 Complement: 38 mg/dL (ref 15–57)

## 2021-05-02 LAB — ANTI-SMITH ANTIBODY: ENA SM Ab Ser-aCnc: <1 AI

## 2021-05-02 LAB — SEDIMENTATION RATE: Sed Rate: 65 mm/h — ABNORMAL HIGH (ref 0–20)

## 2021-05-02 LAB — SJOGRENS SYNDROME-A EXTRACTABLE NUCLEAR ANTIBODY: SSA (Ro) (ENA) Antibody, IgG: <1 AI

## 2021-05-08 ENCOUNTER — Encounter: Payer: Self-pay | Admitting: Nurse Practitioner

## 2021-05-08 ENCOUNTER — Ambulatory Visit (INDEPENDENT_AMBULATORY_CARE_PROVIDER_SITE_OTHER): Payer: Medicaid Other | Admitting: Nurse Practitioner

## 2021-05-08 ENCOUNTER — Other Ambulatory Visit: Payer: Self-pay

## 2021-05-08 VITALS — BP 105/65 | HR 87 | Wt 177.0 lb

## 2021-05-08 DIAGNOSIS — Z3A23 23 weeks gestation of pregnancy: Secondary | ICD-10-CM

## 2021-05-08 DIAGNOSIS — Z87898 Personal history of other specified conditions: Secondary | ICD-10-CM

## 2021-05-08 DIAGNOSIS — Z3490 Encounter for supervision of normal pregnancy, unspecified, unspecified trimester: Secondary | ICD-10-CM | POA: Diagnosis not present

## 2021-05-08 DIAGNOSIS — Z8269 Family history of other diseases of the musculoskeletal system and connective tissue: Secondary | ICD-10-CM | POA: Insufficient documentation

## 2021-05-08 DIAGNOSIS — R700 Elevated erythrocyte sedimentation rate: Secondary | ICD-10-CM

## 2021-05-08 HISTORY — DX: Family history of other diseases of the musculoskeletal system and connective tissue: Z82.69

## 2021-05-08 NOTE — Progress Notes (Signed)
Back pain

## 2021-05-08 NOTE — Progress Notes (Signed)
Subjective:   Angela Sexton is a 31 y.o. G3P1011 at [redacted]w[redacted]d by early ultrasound at 11 weeks being seen today for her first obstetrical visit at this practice but is transferring her care from [place] state.  Some prenatal records are in media.  Requesting lab results.  Has had pap this pregnancy.  RPR tested positive and she had 1 of 3 injections for syphilis at her previous provider.  Is aware she may need to start the treatment series of injections over.  Her obstetrical history is significant for excessive weight gain and positive RPR . Patient does intend to breast feed. Pregnancy history fully reviewed.  Patient reports backache.  HISTORY: OB History  Gravida Para Term Preterm AB Living  3 1 1  0 1 1  SAB IAB Ectopic Multiple Live Births  1 0 0 0 1    # Outcome Date GA Lbr Len/2nd Weight Sex Delivery Anes PTL Lv  3 Current           2 SAB           1 Term  [redacted]w[redacted]d          Past Medical History:  Diagnosis Date   Eczema    Past Surgical History:  Procedure Laterality Date   WISDOM TOOTH EXTRACTION     Family History  Problem Relation Age of Onset   Asthma Mother    Eczema Mother    Scleroderma Father    Melanoma Maternal Grandfather    Lung disease Neg Hx    Social History   Tobacco Use   Smoking status: Never   Smokeless tobacco: Never  Vaping Use   Vaping Use: Never used  Substance Use Topics   Alcohol use: Never   Drug use: Never   Allergies  Allergen Reactions   Sulfa Antibiotics     Nausea/vomiting, weakness   Current Outpatient Medications on File Prior to Visit  Medication Sig Dispense Refill   Prenatal Vit-Fe Fumarate-FA (PRENATAL VITAMIN) 123456 MG TABS      folic acid (FOLVITE) 1 MG tablet Take 1 tablet (1 mg total) by mouth daily. (Patient not taking: Reported on 04/30/2021) 30 tablet 3   vitamin B-12 (CYANOCOBALAMIN) 1000 MCG tablet Take 1 tablet (1,000 mcg total) by mouth daily. (Patient not taking: Reported on 04/30/2021) 30 tablet 3   No  current facility-administered medications on file prior to visit.     Exam   Vitals:   05/08/21 0919  BP: 105/65  Pulse: 87  Weight: 177 lb (80.3 kg)   Fetal Heart Rate (bpm): 160  Uterus:  Fundal Height: 24 cm  Pelvic Exam: Perineum: deferred   Vulva:    Vagina:     Cervix:    Adnexa:    Bony Pelvis:   System: General: well-developed, well-nourished female in no acute distress   Breast:  deferred   Skin: normal coloration and turgor, no rashes   Neurologic: oriented, normal, negative, normal mood   Extremities: normal strength, tone, and muscle mass, ROM of all joints is normal   HEENT extraocular movement intact and sclera clear, anicteric   Mouth/Teeth deferred   Neck supple and no masses, normal thyroid   Cardiovascular: regular rate and rhythm   Respiratory:  no respiratory distress, normal breath sounds   Abdomen: soft, non-tender; no masses,  no organomegaly     Assessment:   Pregnancy: G3P1011 Patient Active Problem List   Diagnosis Date Noted   Supervision of low-risk pregnancy  04/29/2021   Raynaud phenomenon 02/05/2021   Joint pain 01/19/2021   Facial rash 12/07/2020   Elevated sed rate 11/09/2020   B12 deficiency 11/09/2020   Iron deficiency anemia 11/09/2020   Folate deficiency 11/09/2020   Hematuria with proteinuria 11/09/2020   Axillary lymphadenopathy 10/09/2020   Eczema 10/05/2014   Bronchogenic cyst 04/21/2014     Plan:  1. Encounter for supervision of low-risk pregnancy, antepartum Reviewed prepregnancy weight at length.  Weight is documented November 06, 2020.  She lost 10 pounds unintentionally from June to July 2022. Advised too much weight gain is not advised.  Reviewed stopping the large amount of juice she drinks and switch to 64 ounces of water with some flavor added by juice.  Back pain in mid back to sacrum, midline.  Has had for awhile and had in last pregnancy.  Considering waterbirth.  Given website and information on waterbirth  class.  - CBC/D/Plt+RPR+Rh+ABO+RubIgG... - Culture, OB Urine - [place] MFM OB DETAIL +14 WK; Future - Genetic Screening - AMBULATORY REFERRAL TO [facility]  2. History of chest pain Had evaluation in ER 09-2020 and cardiac workup was negative. Has not had problems with chest pain during the pregnancy.  3. Elevated sed rate Being evaliated by rheumatology for lupus although symptoms have resoved while she is pregnant.  Will be tested again postpartum.  Strong family history of scleraderma.  4. [redacted] weeks gestation of pregnancy    Initial labs drawn. Continue prenatal vitamins. Genetic Screening discussed, NIPS: ordered. Ultrasound discussed; fetal anatomic survey: requested. Problem list reviewed and updated. The nature of [facility] with multiple MDs and other Advanced Practice Providers was explained to patient; also emphasized that residents, students are part of our team. Routine obstetric precautions reviewed. Return in about 4 weeks (around 06/05/2021) for needs early AM appt for fasting glucose and ROB.  Total face-to-face time with patient: 40 minutes.  Over 50% of encounter was spent on counseling and coordination of care.     Earlie Server, FNP Family Nurse Practitioner, [facility] for [employer], [facility] 05/08/2021 3:08 PM

## 2021-05-08 NOTE — Patient Instructions (Signed)
[email or web address]  - waterbirth

## 2021-05-09 ENCOUNTER — Encounter: Payer: Self-pay | Admitting: Nurse Practitioner

## 2021-05-09 DIAGNOSIS — O98113 Syphilis complicating pregnancy, third trimester: Secondary | ICD-10-CM | POA: Insufficient documentation

## 2021-05-09 DIAGNOSIS — A539 Syphilis, unspecified: Secondary | ICD-10-CM

## 2021-05-09 DIAGNOSIS — O98112 Syphilis complicating pregnancy, second trimester: Secondary | ICD-10-CM | POA: Insufficient documentation

## 2021-05-09 HISTORY — DX: Syphilis, unspecified: A53.9

## 2021-05-09 LAB — CBC/D/PLT+RPR+RH+ABO+RUBIGG...
Antibody Screen: NEGATIVE
Basophils Absolute: 0 10*3/uL (ref 0.0–0.2)
Basos: 0 %
EOS (ABSOLUTE): 0.1 10*3/uL (ref 0.0–0.4)
Eos: 2 %
HCV Ab: 0.2 s/co ratio (ref 0.0–0.9)
HIV Screen 4th Generation wRfx: NONREACTIVE
Hematocrit: 32.1 % — ABNORMAL LOW (ref 34.0–46.6)
Hemoglobin: 10.7 g/dL — ABNORMAL LOW (ref 11.1–15.9)
Hepatitis B Surface Ag: NEGATIVE
Immature Grans (Abs): 0 10*3/uL (ref 0.0–0.1)
Immature Granulocytes: 0 %
Lymphocytes Absolute: 2.3 10*3/uL (ref 0.7–3.1)
Lymphs: 34 %
MCH: 30.1 pg (ref 26.6–33.0)
MCHC: 33.3 g/dL (ref 31.5–35.7)
MCV: 90 fL (ref 79–97)
Monocytes Absolute: 0.5 10*3/uL (ref 0.1–0.9)
Monocytes: 7 %
Neutrophils Absolute: 3.9 10*3/uL (ref 1.4–7.0)
Neutrophils: 57 %
Platelets: 252 10*3/uL (ref 150–450)
RBC: 3.55 x10E6/uL — ABNORMAL LOW (ref 3.77–5.28)
RDW: 12.1 % (ref 11.7–15.4)
RPR Ser Ql: REACTIVE — AB
Rh Factor: POSITIVE
Rubella Antibodies, IGG: 6 index (ref >0.99)
WBC: 6.8 10*3/uL (ref 3.4–10.8)

## 2021-05-09 LAB — RPR, QUANT+TP ABS (REFLEX)
Rapid Plasma Reagin, Quant: 1:32 {titer} — ABNORMAL HIGH
T Pallidum Abs: REACTIVE — AB

## 2021-05-09 LAB — HCV INTERPRETATION

## 2021-05-10 LAB — URINE CULTURE, OB REFLEX: Organism ID, Bacteria: NO GROWTH

## 2021-05-10 LAB — CULTURE, OB URINE

## 2021-05-10 NOTE — Addendum Note (Signed)
Addended by: Mena Goes on: 05/10/2021 11:53 AM   Modules accepted: Orders

## 2021-05-17 ENCOUNTER — Encounter: Payer: Self-pay | Admitting: Internal Medicine

## 2021-05-17 ENCOUNTER — Other Ambulatory Visit: Payer: Self-pay

## 2021-05-17 ENCOUNTER — Ambulatory Visit (INDEPENDENT_AMBULATORY_CARE_PROVIDER_SITE_OTHER): Payer: Medicaid Other

## 2021-05-17 ENCOUNTER — Ambulatory Visit (INDEPENDENT_AMBULATORY_CARE_PROVIDER_SITE_OTHER): Payer: Medicaid Other | Admitting: Internal Medicine

## 2021-05-17 VITALS — BP 94/66 | HR 80 | Ht 68.0 in | Wt 181.8 lb

## 2021-05-17 VITALS — BP 116/60 | HR 86 | Wt 180.3 lb

## 2021-05-17 DIAGNOSIS — O98112 Syphilis complicating pregnancy, second trimester: Secondary | ICD-10-CM | POA: Diagnosis not present

## 2021-05-17 DIAGNOSIS — R590 Localized enlarged lymph nodes: Secondary | ICD-10-CM | POA: Diagnosis not present

## 2021-05-17 DIAGNOSIS — I7300 Raynaud's syndrome without gangrene: Secondary | ICD-10-CM

## 2021-05-17 DIAGNOSIS — R768 Other specified abnormal immunological findings in serum: Secondary | ICD-10-CM

## 2021-05-17 DIAGNOSIS — R21 Rash and other nonspecific skin eruption: Secondary | ICD-10-CM | POA: Diagnosis not present

## 2021-05-17 MED ORDER — PENICILLIN G BENZATHINE 1200000 UNIT/2ML IM SUSY
2.4000 10*6.[IU] | PREFILLED_SYRINGE | Freq: Once | INTRAMUSCULAR | Status: AC
  Administered 2021-05-17: 2.4 10*6.[IU] via INTRAMUSCULAR

## 2021-05-17 NOTE — Progress Notes (Signed)
Chart reviewed for nurse visit. Agree with plan of care.  ° °Leona Alen N, PA-C °05/17/2021 1:42 PM  ° °

## 2021-05-17 NOTE — Addendum Note (Signed)
Addended by: Maxwell Marion E on: 05/17/2021 12:29 PM   Modules accepted: Orders

## 2021-05-17 NOTE — Progress Notes (Signed)
Office Visit Note  Patient: Angela Sexton             Date of Birth: 1990-07-16           MRN: [id number]             PCP: Joya Gaskins, FNP Referring: Joya Gaskins, * Visit Date: 05/17/2021   Subjective:  No chief complaint on file.   History of Present Illness: Angela Sexton is a 31 y.o. female here for follow up for the multiple symptoms with positive ANA concerning for possible lupus.  Lab results from initial visit were negative for disease specific antibody markers.  Her sedimentation rate was more increased at 65 from 50 despite lack of active symptom complaints at that time.  She continues feeling okay today besides fatigue.  Recent prenatal office visit showing the positive RPR titer and started treatment with first [name] injection today.   Previous HPI 04/30/21 Angela Sexton is a 31 y.o. female here for evaluation of elevated sedimentation rate and axillary lymphadenopathy.  She has been having intermittent symptoms including joint pains, fatigue, skin rashes, persistent hematuria, and chest pains ongoing for a few years but everything much worse during the past year.  She was seen in the emergency department a few times for the severe chest pain with CT angiography negative for any blood clots but demonstrating some cervical adenopathy.  Lab work-up showed some normocytic anemia, vitamin B12 and folate deficiency, and elevated sed rate of 50.  Ultrasound of the neck demonstrated mildly enlarged cervical lymph node without supraclavicular lymph node enlargement.  She has had some joint pains most often worse in the shoulders but also affecting other areas sometimes with visible swelling.  She has a history of eczema which is usually been limited to the trunk or extremities but last summer developed new facial rash over the cheeks.  She experienced right-sided hair loss and thinning.  She had worsening dry mouth symptoms developed a few ulcers on the top and right  cheek of the mouth.  Many of her symptoms are decreased in severity now compared to last summer.  She is now [redacted] weeks pregnant.  During pregnancy a new problem has been some issues with extremities going to sleep easily after even mild amounts of pressure. She has 1 daughter with an uncomplicated pregnancy 8 years ago.  She denies any history of blood clots.  Work-up for chest pain has been unremarkable of abnormal cardiac or CT chest findings last year.   Review of Systems  Constitutional:  Positive for fatigue.  HENT:  Positive for mouth dryness. Negative for mouth sores and nose dryness.   Eyes:  Negative for pain, itching and dryness.  Respiratory:  Negative for shortness of breath and difficulty breathing.   Cardiovascular:  Negative for chest pain and palpitations.  Gastrointestinal:  Negative for blood in stool, constipation and diarrhea.  Endocrine: Negative for increased urination.  Genitourinary:  Negative for difficulty urinating.  Musculoskeletal:  Positive for joint pain, joint pain, myalgias, muscle tenderness and myalgias. Negative for joint swelling and morning stiffness.  Skin:  Negative for color change, rash and redness.  Allergic/Immunologic: Positive for susceptible to infections.  Neurological:  Negative for dizziness, numbness, headaches, memory loss and weakness.  Hematological:  Positive for bruising/bleeding tendency.  Psychiatric/Behavioral:  Negative for confusion.    PMFS History:  Patient Active Problem List   Diagnosis Date Noted   Positive ANA (antinuclear antibody) 05/18/2021   Syphilis affecting pregnancy  in second trimester 05/09/2021   Family history of scleroderma 05/08/2021   Supervision of low-risk pregnancy 04/29/2021   Raynaud phenomenon 02/05/2021   Joint pain 01/19/2021   Facial rash 12/07/2020   Elevated sed rate 11/09/2020   B12 deficiency 11/09/2020   Iron deficiency anemia 11/09/2020   Folate deficiency 11/09/2020   Hematuria with  proteinuria 11/09/2020   Axillary lymphadenopathy 10/09/2020   Eczema 10/05/2014   Bronchogenic cyst 04/21/2014    Past Medical History:  Diagnosis Date   Eczema     Family History  Problem Relation Age of Onset   Asthma Mother    Eczema Mother    Scleroderma Father    Melanoma Maternal Grandfather    Lung disease Neg Hx    Past Surgical History:  Procedure Laterality Date   WISDOM TOOTH EXTRACTION     Social History   Social History Narrative   Not on file   Immunization History  Administered Date(s) Administered   DTaP 01/16/1993, 05/01/1993, 07/10/1993, 05/01/1994, 05/14/1994, 01/17/1995, 11/22/1996   Hepatitis A, Ped/Adol-2 Dose 08/08/2010   Hepatitis B, ped/adol 11/27/1992, 01/16/1993, 11/20/1993   HiB (PRP-T) 01/16/1993, 05/01/1993, 07/15/1993, 05/14/1994   IPV 01/16/1993, 05/01/1993, 07/10/1993, 05/14/1994   MMR 11/20/1993, 11/22/1996   Meningococcal Conjugate 05/19/2007   PFIZER Comirnaty(Gray Top)Covid-19 Tri-Sucrose Vaccine 06/28/2019, 07/29/2019   PFIZER(Purple Top)SARS-COV-2 Vaccination 06/28/2019, 07/29/2019   Tdap 01/06/2005     Objective: Vital Signs: BP 94/66 (BP Location: Left Arm, Patient Position: Sitting, Cuff Size: Normal)    Pulse 80    Ht _0  (1.727 m)    Wt 181 lb 12.8 oz (82.5 kg)    LMP 11/25/2020    BMI 27.64 kg/m    Physical Exam Eyes:     Conjunctiva/sclera: Conjunctivae normal.  Cardiovascular:     Rate and Rhythm: Normal rate and regular rhythm.  Musculoskeletal:     Right lower leg: No edema.     Left lower leg: No edema.  Skin:    General: Skin is warm and dry.     Findings: No rash.  Neurological:     General: No focal deficit present.     Mental Status: She is alert.  Psychiatric:        Mood and Affect: Mood normal.     Musculoskeletal Exam:  Neck full ROM no tenderness Shoulders full ROM no tenderness or swelling Elbows full ROM no tenderness or swelling Wrists full ROM no tenderness or swelling Fingers full ROM  no tenderness or swelling Knees full ROM no tenderness or swelling Ankles full ROM no tenderness or swelling   Investigation: No additional findings.  Imaging: No results found.  Recent Labs: Lab Results  Component Value Date   WBC 6.8 05/08/2021   HGB 10.7 (L) 05/08/2021   PLT 252 05/08/2021   NA 144 11/06/2020   K 3.5 11/06/2020   CL 105 11/06/2020   CO2 31 11/06/2020   GLUCOSE 80 11/06/2020   BUN 8 11/06/2020   CREATININE 0.67 11/06/2020   BILITOT 0.7 11/06/2020   ALKPHOS 60 11/06/2020   AST 11 (L) 11/06/2020   ALT 8 11/06/2020   PROT 7.7 11/06/2020   ALBUMIN 3.5 11/06/2020   CALCIUM 9.3 11/06/2020    Speciality Comments: No specialty comments available.  Procedures:  No procedures performed Allergies: Sulfa antibiotics   Assessment / Plan:     Visit Diagnoses: Positive ANA (antinuclear antibody) Facial rash Axillary lymphadenopathy Raynaud's phenomenon without gangrene  Symptoms remain not very active on repeat exam  today with no major interval events.  I do not know why her sed rate has increased despite the lack of symptoms.  For now I do not think she needs to go on any preventative treatment.  I recommend scheduled follow-up in about 9 months to recheck a couple months after her delivery.  Syphilis affecting pregnancy in second trimester  I discussed syphilis infection can mimic some signs or symptoms of lupus although her problems such as Raynaud's and lymphadenopathy predate this infection. She is already undergoing appropriate treatment.  Orders: No orders of the defined types were placed in this encounter.  No orders of the defined types were placed in this encounter.    Follow-Up Instructions: Return in about 9 months (around 02/14/2022) for +ANA ?SLE pregnancy f/u 27mo.   CCollier Salina MD  Note - This record has been created using [employer]  Chart creation errors have been sought, but may not always  have been located. Such  creation errors do not reflect on  the standard of medical care.

## 2021-05-17 NOTE — Progress Notes (Addendum)
Angela Sexton here for  [name]  Injection for treatment of Syphilis. Patient will need weekly [name] for total of 3 injections per Marvetta Gibbons, NP. Injection administered without complication. Patient will return in one week for next injection.  Marjo Bicker, RN 05/17/2021  10:49 AM

## 2021-05-18 DIAGNOSIS — R768 Other specified abnormal immunological findings in serum: Secondary | ICD-10-CM

## 2021-05-18 HISTORY — DX: Other specified abnormal immunological findings in serum: R76.8

## 2021-05-20 ENCOUNTER — Encounter: Payer: Self-pay | Admitting: *Deleted

## 2021-05-23 ENCOUNTER — Ambulatory Visit (INDEPENDENT_AMBULATORY_CARE_PROVIDER_SITE_OTHER): Payer: Medicaid Other

## 2021-05-23 ENCOUNTER — Other Ambulatory Visit: Payer: Self-pay

## 2021-05-23 VITALS — BP 122/63 | HR 86 | Ht 68.0 in | Wt 180.8 lb

## 2021-05-23 DIAGNOSIS — O98112 Syphilis complicating pregnancy, second trimester: Secondary | ICD-10-CM

## 2021-05-23 MED ORDER — PENICILLIN G BENZATHINE 1200000 UNIT/2ML IM SUSY
2.4000 10*6.[IU] | PREFILLED_SYRINGE | Freq: Once | INTRAMUSCULAR | Status: AC
  Administered 2021-05-23: 2.4 10*6.[IU] via INTRAMUSCULAR

## 2021-05-23 NOTE — Progress Notes (Addendum)
Angela Sexton here for  [name]   Injection for 2nd treatment due to syphilis.  Injection administered without complication. Patient will return in one week for next injection. Pt verbalized understanding and agreeable to plan of care. Pt to food market today.  FHR 164. +FM  [name], RN 05/23/2021  12:16 PM    Patient was assessed and managed by nursing staff during this encounter. I have reviewed the chart and agree with the documentation and plan. I have also made any necessary editorial changes.  Jaynie Collins, MD 05/23/2021 1:04 PM

## 2021-05-27 ENCOUNTER — Ambulatory Visit: Payer: Medicaid Other | Admitting: *Deleted

## 2021-05-27 ENCOUNTER — Ambulatory Visit (HOSPITAL_BASED_OUTPATIENT_CLINIC_OR_DEPARTMENT_OTHER): Payer: Medicaid Other | Admitting: Obstetrics

## 2021-05-27 ENCOUNTER — Ambulatory Visit: Payer: Medicaid Other | Attending: Nurse Practitioner

## 2021-05-27 ENCOUNTER — Other Ambulatory Visit: Payer: Self-pay

## 2021-05-27 ENCOUNTER — Other Ambulatory Visit: Payer: Self-pay | Admitting: *Deleted

## 2021-05-27 ENCOUNTER — Encounter: Payer: Self-pay | Admitting: *Deleted

## 2021-05-27 VITALS — BP 109/59 | HR 79

## 2021-05-27 DIAGNOSIS — Z3A23 23 weeks gestation of pregnancy: Secondary | ICD-10-CM | POA: Diagnosis not present

## 2021-05-27 DIAGNOSIS — Z3689 Encounter for other specified antenatal screening: Secondary | ICD-10-CM

## 2021-05-27 DIAGNOSIS — O0933 Supervision of pregnancy with insufficient antenatal care, third trimester: Secondary | ICD-10-CM

## 2021-05-27 DIAGNOSIS — Z3A27 27 weeks gestation of pregnancy: Secondary | ICD-10-CM | POA: Insufficient documentation

## 2021-05-27 DIAGNOSIS — O0932 Supervision of pregnancy with insufficient antenatal care, second trimester: Secondary | ICD-10-CM

## 2021-05-27 DIAGNOSIS — A539 Syphilis, unspecified: Secondary | ICD-10-CM | POA: Diagnosis not present

## 2021-05-27 DIAGNOSIS — O359XX0 Maternal care for (suspected) fetal abnormality and damage, unspecified, not applicable or unspecified: Secondary | ICD-10-CM

## 2021-05-27 DIAGNOSIS — Z363 Encounter for antenatal screening for malformations: Secondary | ICD-10-CM | POA: Diagnosis not present

## 2021-05-27 DIAGNOSIS — O98112 Syphilis complicating pregnancy, second trimester: Secondary | ICD-10-CM | POA: Diagnosis not present

## 2021-05-27 NOTE — Progress Notes (Signed)
MFM Note  Angela Sexton was seen for a detailed fetal anatomy scan as she recently transferred her care for delivery at [facility].  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  Her RPR test showed a titer level of 1:32.  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  Her HIV test was negative.  She denies any other significant past medical history and denies any other problems in her current pregnancy.    She had a cell free DNA test earlier in her pregnancy which indicated a low risk for trisomy 54, 79, and 13. A female fetus is predicted.   She was informed that the fetal growth and amniotic fluid level were appropriate for her gestational age.   There were no obvious fetal anomalies noted on today's ultrasound exam.  However, today's exam was limited due to the fetal position and her advanced gestational age.  The patient was informed that anomalies may be missed due to technical limitations. If the fetus is in a suboptimal position or maternal habitus is increased, visualization of the fetus in the maternal uterus may be impaired.  Syphilis in pregnancy  Syphilis in pregnant women can cause miscarriage and stillbirth especially if the infection is untreated.  Syphilis can be treated effectively with penicillin during pregnancy.  She should complete the penicillin regimen that has already been initiated.  Her sex partner should also receive treatment to prevent the mother from becoming re-infected and to improve the health of her partner.  Syphilis infection during pregnancy may result in congenital syphilis in the newborn.  Hopefully with penicillin treatment during pregnancy, the risk for congenital syphilis is lowered.   Babies born with congenital syphilis can have bone damage, severe anemia, enlarged liver and spleen, jaundice, nerve problems causing blindness or deafness, meningitis, or skin rashes.  Her pediatrician should be made aware of her syphilis  infection during pregnancy.    Her baby should be thoroughly evaluated after birth to assess for evidence of congenital syphilis and the need for treatment.  These infants should also be followed closely after birth,  as infants with congenital syphilis may not have initial symptoms at birth, but may develop symptoms later if not appropriately treated.  Due to her syphilis infection, we will continue to follow her with growth ultrasounds.    A follow-up exam was scheduled in 4 weeks.  The patient stated that all of her questions have been answered.  A total of 30 minutes was spent counseling and coordinating the care for this patient.  Greater than 50% of the time was spent in direct face-to-face contact.

## 2021-05-30 ENCOUNTER — Other Ambulatory Visit: Payer: Self-pay

## 2021-05-30 ENCOUNTER — Other Ambulatory Visit (HOSPITAL_COMMUNITY)
Admission: RE | Admit: 2021-05-30 | Discharge: 2021-05-30 | Disposition: A | Discharge status: Home / Self Care | Payer: Medicaid Other | Source: Ambulatory Visit | Attending: Family Medicine | Admitting: Family Medicine

## 2021-05-30 ENCOUNTER — Ambulatory Visit (INDEPENDENT_AMBULATORY_CARE_PROVIDER_SITE_OTHER): Payer: Medicaid Other

## 2021-05-30 VITALS — BP 107/64 | HR 83

## 2021-05-30 DIAGNOSIS — Z113 Encounter for screening for infections with a predominantly sexual mode of transmission: Secondary | ICD-10-CM

## 2021-05-30 MED ORDER — PENICILLIN G BENZATHINE 1200000 UNIT/2ML IM SUSY
1.2000 10*6.[IU] | PREFILLED_SYRINGE | Freq: Once | INTRAMUSCULAR | Status: AC
  Administered 2021-05-30: 1.2 10*6.[IU] via INTRAMUSCULAR

## 2021-05-30 NOTE — Progress Notes (Signed)
Pt here today for third dose of RPR treatment.  Pt tolerated well.  Pt also requested to have an STD screening.  Pt explained how to obtain self swab and that we will call her with abnormal results.  Pt asked to be tested for HIV as well.  I advised the pt that she would be tested for when she does her 28 week labs.  Pt requests to do her 2hr with 28 week labs on 05/31/21.  Pt scheduled lab draw per front office.    Leonette Nutting  05/30/21

## 2021-05-31 ENCOUNTER — Other Ambulatory Visit: Payer: Medicaid Other

## 2021-05-31 DIAGNOSIS — Z3490 Encounter for supervision of normal pregnancy, unspecified, unspecified trimester: Secondary | ICD-10-CM

## 2021-05-31 LAB — CERVICOVAGINAL ANCILLARY ONLY
Bacterial Vaginitis (gardnerella): POSITIVE — AB
Candida Glabrata: NEGATIVE
Candida Vaginitis: POSITIVE — AB
Chlamydia: NEGATIVE
Comment: NEGATIVE
Comment: NEGATIVE
Comment: NEGATIVE
Comment: NEGATIVE
Comment: NEGATIVE
Comment: NORMAL
Neisseria Gonorrhea: POSITIVE — AB
Trichomonas: NEGATIVE

## 2021-06-01 LAB — GLUCOSE TOLERANCE, 2 HOURS W/ 1HR
Glucose, 1 hour: 98 mg/dL (ref 70–179)
Glucose, 2 hour: 94 mg/dL (ref 70–152)
Glucose, Fasting: 79 mg/dL (ref 70–91)

## 2021-06-03 ENCOUNTER — Telehealth: Payer: Self-pay

## 2021-06-03 DIAGNOSIS — N760 Acute vaginitis: Secondary | ICD-10-CM

## 2021-06-03 DIAGNOSIS — B9689 Other specified bacterial agents as the cause of diseases classified elsewhere: Secondary | ICD-10-CM

## 2021-06-03 DIAGNOSIS — B379 Candidiasis, unspecified: Secondary | ICD-10-CM

## 2021-06-03 LAB — CBC
Hematocrit: 31.1 % — ABNORMAL LOW (ref 34.0–46.6)
Hemoglobin: 10.5 g/dL — ABNORMAL LOW (ref 11.1–15.9)
MCH: 30.1 pg (ref 26.6–33.0)
MCHC: 33.8 g/dL (ref 31.5–35.7)
MCV: 89 fL (ref 79–97)
Platelets: 247 10*3/uL (ref 150–450)
RBC: 3.49 x10E6/uL — ABNORMAL LOW (ref 3.77–5.28)
RDW: 12.2 % (ref 11.7–15.4)
WBC: 7.4 10*3/uL (ref 3.4–10.8)

## 2021-06-03 LAB — HIV ANTIBODY (ROUTINE TESTING W REFLEX): HIV Screen 4th Generation wRfx: NONREACTIVE

## 2021-06-03 LAB — RPR: RPR Ser Ql: REACTIVE — AB

## 2021-06-03 LAB — RPR, QUANT+TP ABS (REFLEX)
Rapid Plasma Reagin, Quant: 1:32 {titer} — ABNORMAL HIGH
T Pallidum Abs: REACTIVE — AB

## 2021-06-03 MED ORDER — METRONIDAZOLE 500 MG PO TABS: 500.0000 mg | ORAL_TABLET | Freq: Two times a day (BID) | ORAL | 0 refills | Status: DC

## 2021-06-03 MED ORDER — TERCONAZOLE 0.4 % VA CREA: 1.0000 | TOPICAL_CREAM | Freq: Every day | VAGINAL | 0 refills | Status: DC

## 2021-06-03 NOTE — Telephone Encounter (Addendum)
-----   Message from Hermina Staggers, MD sent at 06/03/2021  7:50 AM EST ----- Please treat pt's Gonorrhea, BV and yeast per protocol. Pt is not aware.  Thanks [employer] pt; VM left stating I am calling with results and patient may return call or check MyChart. MyChart message sent.

## 2021-06-05 ENCOUNTER — Ambulatory Visit (INDEPENDENT_AMBULATORY_CARE_PROVIDER_SITE_OTHER): Payer: Medicaid Other | Admitting: Student

## 2021-06-05 ENCOUNTER — Encounter: Payer: Self-pay | Admitting: Student

## 2021-06-05 ENCOUNTER — Other Ambulatory Visit: Payer: Self-pay

## 2021-06-05 VITALS — BP 107/65 | HR 82 | Wt 181.0 lb

## 2021-06-05 DIAGNOSIS — O09299 Supervision of pregnancy with other poor reproductive or obstetric history, unspecified trimester: Secondary | ICD-10-CM

## 2021-06-05 DIAGNOSIS — Z3493 Encounter for supervision of normal pregnancy, unspecified, third trimester: Secondary | ICD-10-CM

## 2021-06-05 DIAGNOSIS — Z3A28 28 weeks gestation of pregnancy: Secondary | ICD-10-CM

## 2021-06-05 DIAGNOSIS — A549 Gonococcal infection, unspecified: Secondary | ICD-10-CM | POA: Diagnosis not present

## 2021-06-05 HISTORY — DX: Supervision of pregnancy with other poor reproductive or obstetric history, unspecified trimester: O09.299

## 2021-06-05 MED ORDER — CEFTRIAXONE SODIUM 500 MG IJ SOLR
500.0000 mg | Freq: Once | INTRAMUSCULAR | Status: AC
  Administered 2021-06-05: 500 mg via INTRAMUSCULAR

## 2021-06-05 NOTE — Patient Instructions (Signed)
AREA PEDIATRIC/FAMILY PRACTICE PHYSICIANS  [place] ([address]) [facility] Chambliss, MD; Eniola, MD; Hale, MD; Hensel, MD; McDiarmid, MD; McIntyer, MD; Neal, MD; Walden, MD [address] [place], [place] [address] [phone] Mon-Fri 8:30-12:30, 1:30-5:00 Providers come to see babies at [facility] Accepting Medicaid [facility] providers who accept newborns: Koirala, MD; Morrow, MD; Wolters, MD [address], [place], [place] [address] [phone] Mon-Fri 8:00-5:30 Babies seen by providers at [facility] Does NOT accept Medicaid Please call early in hospitalization for appointment (limited availability)  [facility] Mulberry, MD [address] [place], [place] [address] [phone] Mon, Tue, Thur, Fri 8:30-5:00, Wed 10:00-7:00 (closed 1-2pm) Babies seen by [facility] providers Accepting Medicaid Rubin - Pediatrician Rubin, MD [address], [place], [place] [address] [phone] Mon-Fri 8:30-5:00, Sat 8:30-12:00 Provider comes to see babies at [facility] Accepting Medicaid Must have been referred from current patients or contacted office prior to delivery [facility] ([facility]) Brown, MD; Chandler, MD; Ettefagh, MD; Grant, MD; Lester, MD; McCormick, MD; McQueen, MD; Prose, MD; Simha, MD; Stanley, MD; Stryffeler, NP; Tebben, NP [address], [place], [place] [address] [phone] Mon, Tue, Thur, Fri 8:30-5:30, Wed 9:30-5:30, Sat 8:30-12:30 Babies seen by [facility] providers Accepting Medicaid Only accepting infants of first-time parents or siblings of current patients Hospital discharge coordinator will make follow-up appointment Jack Amos [address], [place], [place]  [address] [phone]   Fax - [phone] [facility] [address], [place], [place]  [address] Phone - [phone]   Fax - [phone] Shilpa Gosrani [address], [place], [place]  [address] [phone]  [place] ([address]) [facility] Bates, MD; Brassfield, MD; Cooper, Cox, MD; MD; Davis, MD; Dovico, MD; Ettefaugh, MD; Little, MD; Lowe, MD; Keiffer, MD; Melvin, MD; Sumner, MD; Williams, MD [address], [place], [place] [address] [phone] Mon-Fri 8:30-5:00 (extended evenings Mon-Thur as needed), Sat-Sun 10:00-1:00 Providers come to see babies at [facility] Accepting Medicaid for families of first-time babies and families with all children in the household age 3 and under. Must register with office prior to making appointment (M-F only). [facility] Henson, NP; Knapp, MD; Lalonde, MD; Tysinger, PA [address] [place], [place] [address] [phone] Mon-Fri 8:00-5:00 Babies seen by providers at [facility] Does NOT accept Medicaid/Commercial Insurance Only [facility] ([facility])  Artis, MD; Barnes, MD; Bratton, MD; Coccaro, MD; [place] Gardner, MD; Kramer, MD; Marshall, MD; Netherton, MD; Poleto, MD; Skinner, MD [address] [place], [place] [address] [phone] Mon-Fri 8:30-5:30, Sat (Oct.-Mar.) 9:00-1:00 Babies seen by providers at [facility] Accepting Medicaid  [place] ([address]) [facility], MD; Warner, MD [address], , [place] [address] [phone] Mon-Fri 8:30-5:00, Sat 8:30-12:00 Providers come to see babies at [facility] Does NOT accept Medicaid [facility] Becker, PA; Hagler, MD; Scifres, PA; Sun, MD; Swayne, MD [address], , [place] [address] [phone] Mon-Fri 8:00-5:00 Babies seen by providers at [facility] Does NOT accept Medicaid Only accepting babies of parents who  are patients Please call early in hospitalization for appointment (limited availability)  Pediatricians Clark, MD; Frye, MD; Kelleher, MD; Mack, NP; Miller, MD; O'Keller, MD; Patterson, NP; Pudlo, MD; Puzio, MD; Thomas, MD; Tucker, MD; Twiselton, MD [address], [place], [place] [address] [phone] Mon-Fri 8:00-5:00, Sat 9:00-12:00 Providers come to see babies at [facility] Does NOT accept Medicaid  [place] ([address]) [facility] providers accepting new patients: Brake, NP; [place], PA [address], [place], [place] [address] [phone] Mon-Fri 8:00-5:00 Babies seen by providers at [facility] Does NOT accept Medicaid Only accepting babies of parents who are patients Please call early in hospitalization for appointment (limited availability) [facility] Gay, MD; Quinlan, MD [address] [place], [place] [address] [phone] (press 1 to schedule appointment) Mon-Fri 8:00-5:00 Providers come to see babies at [facility] Does NOT accept Medicaid [facility] Mazer, MD [address] [place], [place] [address] [phone] Mon-Fri 8:30-5:00 (lunch 12:30-1:00), extended hours by appointment only Wed 5:00-6:30 Babies seen by [facility] providers Accepting Medicaid [facility] Banks, MD; Jordan, MD; Koberlein, MD [address], [place], [place] [address] [phone] Mon-Fri 8:00-5:00 Babies seen by [facility] providers Does NOT accept Medicaid [facility] Parker, MD; Hunter, MD; [place], DO [address] [place], [place] [address] [phone] Mon-Fri 8:00-5:00 Babies seen by [facility] providers Does NOT accept Medicaid [facility] Brandon, PA; Brecken, PA; Christy, NP; Dees, MD; DeClaire, MD; DeWeese, MD; Hansen, NP; Mills, NP; Parrish, NP; Smoot, NP; Summer, MD; Vapne,  MD [address] [place], [place] [address] [phone] Mon-Fri 8:30-5:00, Sat 10:00-1:00 Providers come to see babies at [facility] Does NOT accept Medicaid Free prenatal information session Tuesdays at 4:45pm [facility] Bouska, MD; Gordon, PA; Jeffery, PA; Weber, PA [address] [place] [address] [phone] Mon-Fri 7:30-5:30 Babies seen by [facility] providers [facility] [address], [place], [place]  [address] [phone]   Fax - [phone]  [place] ([address] & [address]) [facility] [place] [address] [place], [place] [address] [phone] Mon-Thur 8:00-6:00 Providers come to see babies at [facility] Accepting Medicaid [facility] Anderson, NP; Badger, MD; Beal, PA; Spencer, PA [address] [place], [place] [address] [phone] Mon-Thur 7:30-7:30, Fri 7:30-4:30 Babies seen by [facility] providers Accepting Medicaid [facility] Agbuya, MD; Klett, NP; Romgoolam, MD [address], [place], [place] [address] [phone] Mon-Fri 8:30-5:00, Sat 8:30-12:00 Providers come to see babies at [facility] Accepting Medicaid Must have "Meet & Greet" appointment at office prior to delivery [facility] ([facility] McCord, MD; [place], MD; Wood, MD [address], [place], [place] [address] [phone] Mon-Wed 8:00-6:00, Thur-Fri 8:00-5:00, Sat 9:00-12:00 Providers come to see babies at [facility] Does NOT accept Medicaid Only accepting siblings of current patients [facility]  [address], [place], [place]  [address] [phone]   Fax - [phone] [facility] [address], [place], [place]  [address] [phone]   Fax -  [phone]  [place] ([address] & [address]) [facility] Cirigliano, DO; Matthews, DO [address] [place], [place] [address] [phone] Mon-Fri 7:00-5:00 Babies seen by [facility] providers Does NOT accept Medicaid [facility] Briscoe, MD; Howley, PA; Moreira, PA [address], [place], [place] [address] [phone] Mon-Fri 8:00-5:00 Babies seen by [facility] providers Accepting Medicaid [facility] Boyd, MD; Church, PA; Jones, NP; Osborn, PA [address], [place],  [address] [phone] Mon-Fri 8:00-5:00 Babies seen by providers at [facility] Accepting Medicaid  [place] ([address]) [facility] [place] Wendling, DO [address] [place], [place] [address] [phone] Mon-Fri 8:00-5:00 Babies seen by [facility] providers Does NOT accept Medicaid Limited availability, please call early in hospitalization to schedule follow-up [facility] Calderon, PA; Cummings, MD; Dillard, MD; Martin, PA; Olson, MD; VanDeven, PA [address], [place], [place] [address] [phone] Mon-Fri 8:30-5:00, Sat 9:00-12:00 Babies seen by providers at [facility] Accepting Medicaid Please register online then schedule online or call office [email or web address] Hunter, NP; Kumar, MD; Martin Rogers, PA [address], [place], [place] [address] [phone] Mon-Fri 8:00-5:00 Babies seen by providers at [facility] Accepting Medicaid [facility] ([facility]) Barada, MD; Kristi Fleenor, NP; West, MD [address], [place], [place] [address] [phone] Mon-Fri 8:00-5:30, Sat&Sun by appointment (phones open at  8:30) Babies seen by [facility] providers Accepting Medicaid Must be a first-time baby or sibling of current patient [facility] [place]  [address], [place], [place]  [address] [phone]   Fax - [phone]  [place] ([address]) [facility] Brown, PA; Cowen, PA; Rice, MD; Helton, PA; Spry, MD [address] [place], [place] [address] [phone] Mon-Thur 8:00-7:00, Fri 8:00-5:00, Sat 8:00-12:00, Sun 9:00-12:00 Babies seen by [facility] providers Accepting Medicaid [facility] Coe-Goins, MD; Marshall, MD; Pierre-Louis, MD [address] [place] [address], [place], [place] [address] [phone] Mon-Thur 8:00-5:00 Babies seen by providers at [facility] Accepting Medicaid [facility] Bratton, MD; Coe-Goins, MD; Hayes, MD; Lewis, MD; List, MD; Lott, MD; Marshall, MD; Moran, MD; O'Neal, MD; Pierre-Louis, MD; Pitonzo, MD; Scholer, MD; Spangle, MD [address] [place], [place] [address] [phone] Mon-Fri 8:00-5:30, Sat (Oct.-Mar.) 9:00-1:00 Babies seen by providers at [facility] Accepting Medicaid Must fill out new patient packet, available online at [email or web address] [facility] ([facility]) Friddle, NP; Harris, NP; Kelly, NP; Logan, MD; Melvin, PA; Poth, MD; Ramadoss, MD; Stanton, NP [address], [place], [place] [address] [phone] Mon-Thur 8:00-5:30, Fri 8:00-5:00 Babies seen by providers at [facility] Accepting Medicaid  [place] ([address]) [facility] Dixon, PA; , MD; Pickard, MD; Tapia, PA [address], [place], [place] [address] [phone] Mon-Fri 8:00-5:00 Babies seen by providers at [facility] Accepting Medicaid   [place] ([address]) [facility] [place] Masneri, DO; Meyers, MD; Nelson, PA [address], [place], [place] [address] [phone] Mon-Fri 8:00-5:00 Babies seen by providers at [facility] Does NOT accept Medicaid Limited appointment availability, please call early in hospitalization  [facility] [place] Kunedd, DO; McGowen, MD [address], [place], [place] [address] [phone] Mon-Fri 8:00-5:00 Babies seen by [facility] providers Does NOT accept Medicaid [facility] [place] Cameron, MD; MacDonald, MD; Michaels, PA; Nayak, MD [address] [place] [address], [place], [place] [address] [phone] Mon-Fri 8:00-5:00 After hours clinic ([address] [place],  [address]) [phone] Mon-Fri 5:00-8:00, Sat 12:00-6:00, Sun 10:00-4:00 Babies seen by [facility] providers Accepting Medicaid [facility] [place] [address], [place], [place]  [address] [phone]   Fax - [phone]  [place] ([address]) [facility] [place] [facility] Andy, MD [address], [place], [place] [address] [phone] Mon-Fri 8:00-5:00 Babies seen by [facility] providers Does NOT accept Medicaid [facility] [place] ([facility] [place]) Eksir, MD [address], [place], [place] [address] [phone] Mon-Thur 8:00-7:00, Fri 8:00-5:00, Sat 8:00-12:00 Babies seen by providers at [facility] Accepting Medicaid - but does not have vaccinations in office (must be received elsewhere) Limited availability, please call early in hospitalization  [place] ([address]) [facility]  Charlene Flemming, MD [address], [place] [address] [phone]  Fax [phone]  [facility]  Kimberly Newton, MD, Annamarie Streilein, PA, Carla Hampton, PA [address] [place], [place]  [address] [phone] [facility]  [address], [place], [place] [address] [phone] [address], [place], [place] [address] [phone] (West Office)  [facility] [address], [place], [place] [address] [phone] [facility] [address], [place], [place] [address] [phone] [facility] [address], [place], [place] [address] [phone] [facility] [address], [place], [place] [address] [phone] [facility] [address], [place], [place] [address] [phone] [facility] [address], [place], [place] [address] [phone] [facility]  [address], [place], [place] [address] [phone] Dr[facility] [address] [place] [address], [place], [place] [address] [phone] [facility] [address], [place], [place] [address] [phone] [facility] [address], [place], [place] [address] [phone]  

## 2021-06-05 NOTE — Telephone Encounter (Signed)
Patient in office today for routine OB.

## 2021-06-05 NOTE — Progress Notes (Signed)
Patient stated that she is feeling fine and does not have questions or concerns    Severa Jeremiah, CMA

## 2021-06-05 NOTE — Progress Notes (Signed)
° °  PRENATAL VISIT NOTE  Subjective:  Angela Sexton is a 31 y.o. G3P1011 at [redacted]w[redacted]d being seen today for ongoing prenatal care.  She is currently monitored for the following issues for this low-risk pregnancy and has Axillary lymphadenopathy; Elevated sed rate; B12 deficiency; Iron deficiency anemia; Folate deficiency; Hematuria with proteinuria; Supervision of low-risk pregnancy; Facial rash; Joint pain; Raynaud phenomenon; Eczema; Bronchogenic cyst; Family history of scleroderma; Syphilis affecting pregnancy in second trimester; Positive ANA (antinuclear antibody); and Hx of maternal laceration, 4th degree, currently pregnant on their problem list.  Patient reports no complaints.  Contractions: Not present. Vag. Bleeding: None.  Movement: Present. Denies leaking of fluid.   The following portions of the patient's history were reviewed and updated as appropriate: allergies, current medications, past family history, past medical history, past social history, past surgical history and problem list.   Objective:   Vitals:   06/05/21 1002  BP: 107/65  Pulse: 82  Weight: 181 lb (82.1 kg)    Fetal Status: Fetal Heart Rate (bpm): 150 Fundal Height: 28 cm Movement: Present     General:  Alert, oriented and cooperative. Patient is in no acute distress.  Skin: Skin is warm and dry. No rash noted.   Cardiovascular: Normal heart rate noted  Respiratory: Normal respiratory effort, no problems with respiration noted  Abdomen: Soft, gravid, appropriate for gestational age.  Pain/Pressure: Absent     Pelvic: Cervical exam deferred        Extremities: Normal range of motion.     Mental Status: Normal mood and affect. Normal behavior. Normal judgment and thought content.   Assessment and Plan:  Pregnancy: G3P1011 at [redacted]w[redacted]d 1. Gonorrhea -treated in [facility] - cefTRIAXone (ROCEPHIN) injection 500 mg -list of peds given  -discussed OCP for Family Planning -reviewed delivery note in Epic; will have patient  meet with MD to talk about possible c/section; she does not have any complications after her 4th degree and patient reports that she has healed up   Preterm labor symptoms and general obstetric precautions including but not limited to vaginal bleeding, contractions, leaking of fluid and fetal movement were reviewed in detail with the patient. Please refer to After Visit Summary for other counseling recommendations.   Return in about 2 weeks (around 06/19/2021), or to see MD to talk about possible c/section.  Future Appointments  Date Time Provider [place]  06/24/2021  2:30 PM [facility] NURSE [facility]  06/24/2021  2:45 PM [facility] US5 [facility]  02/14/2022 10:40 AM Rice, Resa Miner, MD [facility] None    Mervyn Skeeters [place], [place]

## 2021-06-09 ENCOUNTER — Other Ambulatory Visit: Payer: Self-pay | Admitting: Student

## 2021-06-09 MED ORDER — FERROUS FUMARATE-FOLIC ACID 324-1 MG PO TABS: 1.0000 | ORAL_TABLET | ORAL | 0 refills | Status: DC

## 2021-06-12 ENCOUNTER — Telehealth: Payer: Self-pay | Admitting: Lactation Services

## 2021-06-12 MED ORDER — FERROUS SULFATE 325 (65 FE) MG PO TABS: 325.0000 mg | ORAL_TABLET | ORAL | 2 refills | Status: DC

## 2021-06-12 MED ORDER — FOLIC ACID 1 MG PO TABS
1.0000 mg | ORAL_TABLET | Freq: Every day | ORAL | 11 refills | Status: DC
Start: 2021-06-12 — End: 2021-09-19

## 2021-06-12 MED ORDER — PRENATAL PLUS 27-1 MG PO TABS: 1.0000 | ORAL_TABLET | Freq: Every day | ORAL | 11 refills | Status: DC

## 2021-06-12 NOTE — Telephone Encounter (Signed)
-----   Message from Marylene Land, CNM sent at 06/12/2021  1:04 PM EST ----- Regarding: RE: Hematinic/Folic Acid Prescription Hi! That is fine!  ----- Message ----- From: Ed Blalock, RN Sent: 06/12/2021  11:26 AM EST To: Marylene Land, CNM Subject: Hematinic/Folic Acid Prescription              KK,   Insurance has denied this medication for the patient. Is there a reason you want this particular one of would she be ok to add an iron supplement with her PNV?   Thanks Jasmine December, RN

## 2021-06-12 NOTE — Telephone Encounter (Signed)
Spoke with Provider in regards to Hematinic/Folic Acid supplement being denied.   Called and spoke with patient and she has PNV at home that she purchased on her own, but not the Folate or Fe.   Reviewed  I will send all 3 prescriptions to the pharmacy and she can pick up today. Patient voiced  understanding.

## 2021-06-19 ENCOUNTER — Other Ambulatory Visit: Payer: Self-pay

## 2021-06-19 ENCOUNTER — Ambulatory Visit (INDEPENDENT_AMBULATORY_CARE_PROVIDER_SITE_OTHER): Payer: Medicaid Other | Admitting: Obstetrics and Gynecology

## 2021-06-19 VITALS — BP 108/71 | HR 96 | Wt 183.0 lb

## 2021-06-19 DIAGNOSIS — Z3493 Encounter for supervision of normal pregnancy, unspecified, third trimester: Secondary | ICD-10-CM

## 2021-06-19 DIAGNOSIS — O09299 Supervision of pregnancy with other poor reproductive or obstetric history, unspecified trimester: Secondary | ICD-10-CM

## 2021-06-19 DIAGNOSIS — Z3A30 30 weeks gestation of pregnancy: Secondary | ICD-10-CM

## 2021-06-19 NOTE — Progress Notes (Signed)
? ?  PRENATAL VISIT NOTE ? ?Subjective:  ?Angela Sexton is a 31 y.o. G3P1011 at [redacted]w[redacted]d being seen today for ongoing prenatal care.  She is currently monitored for the following issues for this low-risk pregnancy and has Axillary lymphadenopathy; Elevated sed rate; B12 deficiency; Iron deficiency anemia; Folate deficiency; Hematuria with proteinuria; Supervision of low-risk pregnancy; Facial rash; Joint pain; Raynaud phenomenon; Eczema; Bronchogenic cyst; Family history of scleroderma; Syphilis complicating pregnancy, third trimester; Positive ANA (antinuclear antibody); and Hx of maternal laceration, 4th degree, currently pregnant on their problem list. ? ?Patient doing well with no acute concerns today. She reports no complaints.  Contractions: Irritability. Vag. Bleeding: None.  Movement: Present. Denies leaking of fluid.  ?Discussed possibility of primary cesarean section due to hx of 4 th degree laceration in childbirth.  C section protocol discussed in detail.  Pt is leaning toward primary c/s.  Will schedule if she has made decision at next visit. ? ?The following portions of the patient's history were reviewed and updated as appropriate: allergies, current medications, past family history, past medical history, past social history, past surgical history and problem list. Problem list updated. ? ?Objective:  ? ?Vitals:  ? 06/19/21 1610  ?BP: 108/71  ?Pulse: 96  ?Weight: 183 lb (83 kg)  ? ? ?Fetal Status: Fetal Heart Rate (bpm): 155 Fundal Height: 30 cm Movement: Present    ? ?General:  Alert, oriented and cooperative. Patient is in no acute distress.  ?Skin: Skin is warm and dry. No rash noted.   ?Cardiovascular: Normal heart rate noted  ?Respiratory: Normal respiratory effort, no problems with respiration noted  ?Abdomen: Soft, gravid, appropriate for gestational age.  Pain/Pressure: Present     ?Pelvic: Cervical exam deferred        ?Extremities: Normal range of motion.  Edema: Trace  ?Mental Status:  Normal  mood and affect. Normal behavior. Normal judgment and thought content.  ? ?Assessment and Plan:  ?Pregnancy: G3P1011 at [redacted]w[redacted]d ? ?1. Hx of maternal laceration, 4th degree, currently pregnant ?Pt considering primary cesarean, hopefully decision by next visit ? ?2. [redacted] weeks gestation of pregnancy ? ? ?3. Encounter for supervision of low-risk pregnancy in third trimester ?Continue routine care.  Aware of syphilis diagnosis, pt has been treated, recheck at 6 and 12 months ? ?Preterm labor symptoms and general obstetric precautions including but not limited to vaginal bleeding, contractions, leaking of fluid and fetal movement were reviewed in detail with the patient. ? ?Please refer to After Visit Summary for other counseling recommendations.  ? ?Return in about 2 weeks (around 07/03/2021) for LOB, in person. ? ? ?Mariel Aloe, MD ?Faculty Attending ?Center for [facility] ?  ?

## 2021-06-24 ENCOUNTER — Ambulatory Visit: Payer: Medicaid Other | Admitting: *Deleted

## 2021-06-24 ENCOUNTER — Other Ambulatory Visit: Payer: Self-pay | Admitting: *Deleted

## 2021-06-24 ENCOUNTER — Ambulatory Visit: Payer: Medicaid Other | Attending: Obstetrics

## 2021-06-24 ENCOUNTER — Encounter: Payer: Self-pay | Admitting: *Deleted

## 2021-06-24 ENCOUNTER — Other Ambulatory Visit: Payer: Self-pay

## 2021-06-24 VITALS — BP 117/61 | HR 94

## 2021-06-24 DIAGNOSIS — A539 Syphilis, unspecified: Secondary | ICD-10-CM | POA: Diagnosis not present

## 2021-06-24 DIAGNOSIS — O2693 Pregnancy related conditions, unspecified, third trimester: Secondary | ICD-10-CM | POA: Insufficient documentation

## 2021-06-24 DIAGNOSIS — O98113 Syphilis complicating pregnancy, third trimester: Secondary | ICD-10-CM

## 2021-06-24 DIAGNOSIS — Z3A31 31 weeks gestation of pregnancy: Secondary | ICD-10-CM | POA: Diagnosis not present

## 2021-06-24 DIAGNOSIS — Z3493 Encounter for supervision of normal pregnancy, unspecified, third trimester: Secondary | ICD-10-CM | POA: Insufficient documentation

## 2021-06-24 DIAGNOSIS — O0933 Supervision of pregnancy with insufficient antenatal care, third trimester: Secondary | ICD-10-CM | POA: Insufficient documentation

## 2021-06-24 DIAGNOSIS — Z362 Encounter for other antenatal screening follow-up: Secondary | ICD-10-CM | POA: Insufficient documentation

## 2021-06-24 DIAGNOSIS — Z3689 Encounter for other specified antenatal screening: Secondary | ICD-10-CM

## 2021-07-03 ENCOUNTER — Other Ambulatory Visit: Payer: Self-pay

## 2021-07-03 ENCOUNTER — Other Ambulatory Visit (HOSPITAL_COMMUNITY)
Admission: RE | Admit: 2021-07-03 | Discharge: 2021-07-03 | Disposition: A | Discharge status: Home / Self Care | Payer: Medicaid Other | Source: Ambulatory Visit | Attending: Family Medicine | Admitting: Family Medicine

## 2021-07-03 ENCOUNTER — Encounter: Payer: Self-pay | Admitting: Family Medicine

## 2021-07-03 ENCOUNTER — Ambulatory Visit (INDEPENDENT_AMBULATORY_CARE_PROVIDER_SITE_OTHER): Payer: Medicaid Other | Admitting: Family Medicine

## 2021-07-03 VITALS — BP 133/67 | HR 86 | Wt 188.3 lb

## 2021-07-03 DIAGNOSIS — N760 Acute vaginitis: Secondary | ICD-10-CM | POA: Insufficient documentation

## 2021-07-03 DIAGNOSIS — O23599 Infection of other part of genital tract in pregnancy, unspecified trimester: Secondary | ICD-10-CM | POA: Diagnosis present

## 2021-07-03 DIAGNOSIS — O98113 Syphilis complicating pregnancy, third trimester: Secondary | ICD-10-CM

## 2021-07-03 DIAGNOSIS — O09299 Supervision of pregnancy with other poor reproductive or obstetric history, unspecified trimester: Secondary | ICD-10-CM

## 2021-07-03 DIAGNOSIS — Z3493 Encounter for supervision of normal pregnancy, unspecified, third trimester: Secondary | ICD-10-CM

## 2021-07-03 DIAGNOSIS — Z3A32 32 weeks gestation of pregnancy: Secondary | ICD-10-CM

## 2021-07-03 DIAGNOSIS — B3731 Acute candidiasis of vulva and vagina: Secondary | ICD-10-CM | POA: Insufficient documentation

## 2021-07-03 NOTE — Progress Notes (Signed)
? ? ?  Subjective:  ?Angela Sexton is a 31 y.o. G3P1011 at [redacted]w[redacted]d being seen today for ongoing prenatal care.  She is currently monitored for the following issues for this high-risk pregnancy and has Axillary lymphadenopathy; Elevated sed rate; B12 deficiency; Iron deficiency anemia; Folate deficiency; Hematuria with proteinuria; Supervision of low-risk pregnancy; Facial rash; Joint pain; Raynaud phenomenon; Eczema; Bronchogenic cyst; Family history of scleroderma; Syphilis complicating pregnancy, third trimester; Positive ANA (antinuclear antibody); and Hx of maternal laceration, 4th degree, currently pregnant on their problem list. ? ?Patient reports  vaginal irritation  otherwise is doing well. Has not decided if she would like to do a vaginal vs primary C/S.  Contractions: Irritability. Vag. Bleeding: None.  Movement: Present. Denies leaking of fluid. ? ?The following portions of the patient's history were reviewed and updated as appropriate: allergies, current medications, past family history, past medical history, past social history, past surgical history and problem list.  ? ?Objective:  ? ?Vitals:  ? 07/03/21 1620  ?BP: 133/67  ?Pulse: 86  ?Weight: 188 lb 4.8 oz (85.4 kg)  ? ? ?Fetal Status: Fetal Heart Rate (bpm): 148 Fundal Height: 31 cm Movement: Present    ? ?General:  Alert, oriented and cooperative. Patient is in no acute distress.  ?Skin: Skin is warm and dry. No rash noted.   ?Cardiovascular: Normal heart rate noted  ?Respiratory: Normal respiratory effort, no problems with respiration noted  ?Abdomen: Soft, gravid, appropriate for gestational age. Pain/Pressure: Present     ?Pelvic:  Cervical exam deferred        ?Extremities: Normal range of motion.  Edema: Trace  ?Mental Status: Normal mood and affect. Normal behavior. Normal judgment and thought content.  ? ? ?Assessment and Plan:  ?Pregnancy: G3P1011 at [redacted]w[redacted]d ? ?1. Encounter for supervision of low-risk pregnancy in third trimester ?Doing well  with normal fetal movement.  ? ?2. [redacted] weeks gestation of pregnancy ?Discussed gc/ch/GBS in upcoming visit.  ? ?3. Vaginitis affecting pregnancy, antepartum ?- Cervicovaginal ancillary only( [place]) ? ?4. Hx of maternal laceration, 4th degree, currently pregnant ?Discussed risks vs benefits with vaginal vs primary C/S for majority of visit. She has a growth [place] scheduled for 4/3. She would like to have a better idea for growth in comparison to her first to make her final decision.  ? ?5. Syphilis complicating pregnancy, third trimester ?S/p 3 weeks of PCN.  ? ?Preterm labor symptoms and general obstetric precautions including but not limited to vaginal bleeding, contractions, leaking of fluid and fetal movement were reviewed in detail with the patient. ?Please refer to After Visit Summary for other counseling recommendations.  ?Return in about 2 weeks (around 07/17/2021) for HROB. ? ? ?Allayne Stack, DO ?

## 2021-07-05 ENCOUNTER — Inpatient Hospital Stay (HOSPITAL_COMMUNITY)
Admission: AD | Admit: 2021-07-05 | Discharge: 2021-07-05 | Disposition: A | Discharge status: Home / Self Care | Payer: Medicaid Other | Attending: Obstetrics and Gynecology | Admitting: Obstetrics and Gynecology

## 2021-07-05 ENCOUNTER — Telehealth: Payer: Self-pay | Admitting: Family Medicine

## 2021-07-05 ENCOUNTER — Encounter (HOSPITAL_COMMUNITY): Payer: Self-pay | Admitting: Obstetrics and Gynecology

## 2021-07-05 ENCOUNTER — Other Ambulatory Visit: Payer: Self-pay

## 2021-07-05 DIAGNOSIS — Z3689 Encounter for other specified antenatal screening: Secondary | ICD-10-CM | POA: Diagnosis not present

## 2021-07-05 DIAGNOSIS — Z3493 Encounter for supervision of normal pregnancy, unspecified, third trimester: Secondary | ICD-10-CM

## 2021-07-05 DIAGNOSIS — Z3A32 32 weeks gestation of pregnancy: Secondary | ICD-10-CM

## 2021-07-05 DIAGNOSIS — O4703 False labor before 37 completed weeks of gestation, third trimester: Secondary | ICD-10-CM

## 2021-07-05 LAB — CERVICOVAGINAL ANCILLARY ONLY
Bacterial Vaginitis (gardnerella): NEGATIVE
Candida Glabrata: NEGATIVE
Candida Vaginitis: POSITIVE — AB
Chlamydia: NEGATIVE
Comment: NEGATIVE
Comment: NEGATIVE
Comment: NEGATIVE
Comment: NEGATIVE
Comment: NEGATIVE
Comment: NORMAL
Neisseria Gonorrhea: NEGATIVE
Trichomonas: NEGATIVE

## 2021-07-05 LAB — URINALYSIS, ROUTINE W REFLEX MICROSCOPIC
Bacteria, UA: NONE SEEN
Bilirubin Urine: NEGATIVE
Glucose, UA: NEGATIVE mg/dL
Hgb urine dipstick: NEGATIVE
Ketones, ur: NEGATIVE mg/dL
Leukocytes,Ua: NEGATIVE
Nitrite: NEGATIVE
Protein, ur: NEGATIVE mg/dL
Specific Gravity, Urine: 1.01 (ref 1.005–1.030)
pH: 9 — ABNORMAL HIGH (ref 5.0–8.0)

## 2021-07-05 LAB — WET PREP, GENITAL
Clue Cells Wet Prep HPF POC: NONE SEEN
Sperm: NONE SEEN
Trich, Wet Prep: NONE SEEN
WBC, Wet Prep HPF POC: <10 (ref <10)
Yeast Wet Prep HPF POC: NONE SEEN

## 2021-07-05 LAB — FETAL FIBRONECTIN: Fetal Fibronectin: NEGATIVE

## 2021-07-05 MED ORDER — NIFEDIPINE 10 MG PO CAPS
10.0000 mg | ORAL_CAPSULE | ORAL | Status: AC
  Administered 2021-07-05 (×3): 10 mg via ORAL
  Filled 2021-07-05 (×3): qty 1

## 2021-07-05 NOTE — Telephone Encounter (Signed)
Called pt back and there was a poor connection which resulted in a dropped call. I called her again and left message stating that I will try her back in a few minutes in order to provide medical advice for her concerns.  ? ?41  Talked with pt and she states that she has been having constant abdominal cramping as well as shooting pain in her vagina. This has been going on since 5:30 am and has not eased up. Pt advised to go to [facility] for evaluation.  She stated that she has just left work and is on her way.  ? ? ?

## 2021-07-05 NOTE — Telephone Encounter (Signed)
This patient state she have been cramping since around 5 this morning, want to know if she should go to the hospital? ?

## 2021-07-05 NOTE — MAU Note (Signed)
Angela Sexton is a 31 y.o. at [redacted]w[redacted]d here in MAU reporting: she's been having ctxs since 0500 this morning.   ? ?Onset of complaint: 0500 today ?Pain score: 6 ?Vitals:  ? 07/05/21 1046  ?BP: 111/61  ?Pulse: 84  ?Resp: 20  ?Temp: 98 ?F (36.7 ?C)  ?SpO2: 100%  ?   ?FHT:150 bpm, +FM, denies LOF or VB ?Lab orders placed from triage:   U/A ?

## 2021-07-05 NOTE — MAU Provider Note (Signed)
?History  ?  ? ?CSN: [id number] ? ?Arrival date and time: 07/05/21 1018 ? ? Event Date/Time  ? First Provider Initiated Contact with Patient 07/05/21 1119   ?  ? ?Chief Complaint  ?Patient presents with  ? Contractions  ? ?31 year old G3 P1-0-1-1 at 32.6 weeks presenting with contractions.  Reports onset around 5 AM today.  She is unsure of frequency or duration.  Feels that she is having about 4-5 in an hour.  Denies VB or LOF.  Reports good FM.  Denies urinary symptoms.  No recent intercourse.  Denies vaginal discharge itching or malodor. ? ? ?OB History   ? ? Gravida  ?3  ? Para  ?1  ? Term  ?1  ? Preterm  ?0  ? AB  ?1  ? Living  ?1  ?  ? ? SAB  ?1  ? IAB  ?0  ? Ectopic  ?0  ? Multiple  ?0  ? Live Births  ?1  ?   ?  ?  ? ? ?Past Medical History:  ?Diagnosis Date  ? Eczema   ? ? ?Past Surgical History:  ?Procedure Laterality Date  ? WISDOM TOOTH EXTRACTION    ? ? ?Family History  ?Problem Relation Age of Onset  ? Asthma Mother   ? Eczema Mother   ? Scleroderma Father   ? Melanoma Maternal Grandfather   ? Lung disease Neg Hx   ? ? ?Social History  ? ?Tobacco Use  ? Smoking status: Never  ? Smokeless tobacco: Never  ?Vaping Use  ? Vaping Use: Never used  ?Substance Use Topics  ? Alcohol use: Never  ? Drug use: Never  ? ? ?Allergies:  ?Allergies  ?Allergen Reactions  ? Sulfa Antibiotics   ?  Nausea/vomiting, weakness  ? ? ?Medications Prior to Admission  ?Medication Sig Dispense Refill Last Dose  ? Ferrous Fumarate-Folic Acid 99991111 MG TABS Take 1 tablet by mouth every other day. 60 tablet 0   ? ferrous sulfate 325 (65 FE) MG tablet Take 1 tablet (325 mg total) by mouth every other day. 100 tablet 2   ? folic acid (FOLVITE) 1 MG tablet Take 1 tablet (1 mg total) by mouth daily. 30 tablet 11   ? prenatal vitamin w/FE, FA (PRENATAL 1 + 1) 27-1 MG TABS tablet Take 1 tablet by mouth daily at 12 noon. 30 tablet 11   ? ? ?Review of Systems  ?Gastrointestinal:  Positive for abdominal pain (ctx).  ?Genitourinary:  Negative for  dysuria, frequency, hematuria, urgency, vaginal bleeding and vaginal discharge.  ?Physical Exam  ? ?Blood pressure 116/79, pulse 85, temperature 98 ?F (36.7 ?C), temperature source Oral, resp. rate 20, height 5\' 8"  (1.727 m), weight 86.6 kg, last menstrual period 11/25/2020, SpO2 99 %. ? ?Physical Exam ?Vitals and nursing note reviewed. Exam conducted with a chaperone present.  ?Constitutional:   ?   General: She is not in acute distress. ?   Appearance: Normal appearance.  ?HENT:  ?   Head: Normocephalic and atraumatic.  ?Cardiovascular:  ?   Rate and Rhythm: Normal rate.  ?Pulmonary:  ?   Effort: Pulmonary effort is normal. No respiratory distress.  ?Abdominal:  ?   Palpations: Abdomen is soft.  ?   Tenderness: There is no abdominal tenderness.  ?Genitourinary: ?   Comments: Ve: closed/thick ?Musculoskeletal:     ?   General: Normal range of motion.  ?   Cervical back: Normal range of motion.  ?Skin: ?  General: Skin is warm and dry.  ?Neurological:  ?   General: No focal deficit present.  ?   Mental Status: She is alert and oriented to person, place, and time.  ?Psychiatric:     ?   Mood and Affect: Mood normal.     ?   Behavior: Behavior normal.  ?EFM: 145 bpm, mos variability, + accels, no decels ?Toco: 3-4 ? ?Results for orders placed or performed during the hospital encounter of 07/05/21 (from the past 24 hour(s))  ?Urinalysis, Routine w reflex microscopic Urine, Clean Catch     Status: Abnormal  ? Collection Time: 07/05/21 11:09 AM  ?Result Value Ref Range  ? Color, Urine YELLOW YELLOW  ? APPearance HAZY (A) CLEAR  ? Specific Gravity, Urine 1.010 1.005 - 1.030  ? pH 9.0 (H) 5.0 - 8.0  ? Glucose, UA NEGATIVE NEGATIVE mg/dL  ? Hgb urine dipstick NEGATIVE NEGATIVE  ? Bilirubin Urine NEGATIVE NEGATIVE  ? Ketones, ur NEGATIVE NEGATIVE mg/dL  ? Protein, ur NEGATIVE NEGATIVE mg/dL  ? Nitrite NEGATIVE NEGATIVE  ? Leukocytes,Ua NEGATIVE NEGATIVE  ? WBC, UA 0-5 0 - 5 WBC/hpf  ? Bacteria, UA NONE SEEN NONE SEEN  ?  Squamous Epithelial / LPF 0-5 0 - 5  ? Mucus PRESENT   ? Amorphous Crystal PRESENT   ?Wet prep, genital     Status: None  ? Collection Time: 07/05/21 11:37 AM  ? Specimen: Vaginal  ?Result Value Ref Range  ? Yeast Wet Prep HPF POC NONE SEEN NONE SEEN  ? Trich, Wet Prep NONE SEEN NONE SEEN  ? Clue Cells Wet Prep HPF POC NONE SEEN NONE SEEN  ? WBC, Wet Prep HPF POC <10 <10  ? Sperm NONE SEEN   ?Fetal fibronectin     Status: None  ? Collection Time: 07/05/21 11:37 AM  ?Result Value Ref Range  ? Fetal Fibronectin NEGATIVE NEGATIVE  ? ?MAU Course  ?Procedures ?Procardia x3 ?MDM ?Labs ordered and reviewed.  No evidence of PTL or UTI.  Reports less contractions/pain after Procardia.  Stable for discharge home. ? ?Assessment and Plan  ? ?1. Encounter for supervision of low-risk pregnancy in third trimester   ?2. [redacted] weeks gestation of pregnancy   ?3. NST (non-stress test) reactive   ?4. Preterm uterine contractions in third trimester, antepartum   ? ?Discharge home ?Follow-up in 2 weeks as scheduled at [facility] ?PTL precautions ? ?Allergies as of 07/05/2021   ? ?   Reactions  ? Sulfa Antibiotics   ? Nausea/vomiting, weakness  ? ?  ? ?  ?Medication List  ?  ? ?STOP taking these medications   ? ?Ferrous Fumarate-Folic Acid 99991111 MG Tabs ?  ? ?  ? ?TAKE these medications   ? ?ferrous sulfate 325 (65 FE) MG tablet ?Take 1 tablet (325 mg total) by mouth every other day. ?  ?folic acid 1 MG tablet ?Commonly known as: FOLVITE ?Take 1 tablet (1 mg total) by mouth daily. ?  ?prenatal vitamin w/FE, FA 27-1 MG Tabs tablet ?Take 1 tablet by mouth daily at 12 noon. ?  ? ?  ? ?Julianne Handler, CNM ?07/05/2021, 2:24 PM  ?

## 2021-07-08 ENCOUNTER — Other Ambulatory Visit: Payer: Self-pay | Admitting: Family Medicine

## 2021-07-08 DIAGNOSIS — B3731 Acute candidiasis of vulva and vagina: Secondary | ICD-10-CM

## 2021-07-08 LAB — GC/CHLAMYDIA PROBE AMP (~~LOC~~) NOT AT ARMC
Chlamydia: NEGATIVE
Comment: NEGATIVE
Comment: NORMAL
Neisseria Gonorrhea: NEGATIVE

## 2021-07-08 MED ORDER — TERCONAZOLE 0.4 % VA CREA: 1.0000 | TOPICAL_CREAM | Freq: Every day | VAGINAL | 0 refills | Status: DC

## 2021-07-17 ENCOUNTER — Encounter: Payer: Medicaid Other | Admitting: Medical

## 2021-07-18 ENCOUNTER — Encounter: Payer: Self-pay | Admitting: Obstetrics & Gynecology

## 2021-07-18 ENCOUNTER — Telehealth (INDEPENDENT_AMBULATORY_CARE_PROVIDER_SITE_OTHER): Payer: Medicaid Other | Admitting: Obstetrics & Gynecology

## 2021-07-18 DIAGNOSIS — Z3493 Encounter for supervision of normal pregnancy, unspecified, third trimester: Secondary | ICD-10-CM

## 2021-07-18 DIAGNOSIS — O98113 Syphilis complicating pregnancy, third trimester: Secondary | ICD-10-CM | POA: Diagnosis not present

## 2021-07-18 DIAGNOSIS — Z3A34 34 weeks gestation of pregnancy: Secondary | ICD-10-CM

## 2021-07-18 DIAGNOSIS — A539 Syphilis, unspecified: Secondary | ICD-10-CM

## 2021-07-18 NOTE — Progress Notes (Signed)
? ? ?  TELEHEALTH OBSTETRICS VISIT ENCOUNTER NOTE ? ?Provider location: Center for [employer] [facility] [employer] [facility]  ? ?Patient location: Home ? ?I connected with Angela Sexton on 07/18/21 at  1:15 PM EDT by telephone at home and verified that I am speaking with the correct person using two identifiers. Of note, unable to do video encounter due to technical difficulties.  ?  ?I discussed the limitations, risks, security and privacy concerns of performing an evaluation and management service by telephone and the availability of in person appointments. I also discussed with the patient that there may be a patient responsible charge related to this service. The patient expressed understanding and agreed to proceed. ? ?Subjective:  ?Angela Sexton is a 31 y.o. G3P1011 at [redacted]w[redacted]d being followed for ongoing prenatal care.  She is currently monitored for the following issues for this high-risk pregnancy and has Axillary lymphadenopathy; Elevated sed rate; B12 deficiency; Iron deficiency anemia; Folate deficiency; Hematuria with proteinuria; Supervision of low-risk pregnancy; Facial rash; Joint pain; Raynaud phenomenon; Eczema; Bronchogenic cyst; Family history of scleroderma; Syphilis complicating pregnancy, third trimester; Positive ANA (antinuclear antibody); and Hx of maternal laceration, 4th degree, currently pregnant on their problem list. ? ?Patient reports no complaints. Reports fetal movement. Denies any contractions, bleeding or leaking of fluid.  ? ?The following portions of the patient's history were reviewed and updated as appropriate: allergies, current medications, past family history, past medical history, past social history, past surgical history and problem list.  ? ?Objective:  ?Last menstrual period 11/25/2020. ?General:  Alert, oriented and cooperative.   ?Mental Status: Normal mood and affect perceived. Normal judgment and thought content.  ?Rest of physical exam deferred due to type of  encounter ? ?Assessment and Plan:  ?Pregnancy: G3P1011 at [redacted]w[redacted]d ?1. Encounter for supervision of low-risk pregnancy in third trimester ?Discussed her previous deliery, episiotomy and laceration, EFW ? ?2. Syphilis complicating pregnancy, third trimester ?Needs titers after treatment ? ?Preterm labor symptoms and general obstetric precautions including but not limited to vaginal bleeding, contractions, leaking of fluid and fetal movement were reviewed in detail with the patient.  ?I discussed the assessment and treatment plan with the patient. The patient was provided an opportunity to ask questions and all were answered. The patient agreed with the plan and demonstrated an understanding of the instructions. The patient was advised to call back or seek an in-person office evaluation/go to MAU at [facility] for any urgent or concerning symptoms. ?Please refer to After Visit Summary for other counseling recommendations.  ? ?I provided 12 minutes of non-face-to-face time during this encounter. ? ?Return in about 2 weeks (around 08/01/2021). ? ?Future Appointments  ?Date Time Provider Department Center  ?07/22/2021  3:15 PM [facility] NURSE [facility]  ?07/22/2021  3:30 PM [facility] US2 [facility]  ?02/14/2022 10:40 AM Rice, Jamesetta Orleans, MD [facility] None  ? ? ?Scheryl Darter, MD ?Center for [facility], [facility] ? ? ? ?

## 2021-07-18 NOTE — Progress Notes (Signed)
Patient in for routine prenatal visit. Experiencing mild cramps and contractions. Was seen in MAU on 3/17 for these concerns. DC same day from MAU. Per patient still experiencing some mild cramps and contractions but nothing concerning. ? ? ?Reports good fetal movement. No issues or concerns at this time ? ?Wynona Canes, CMA ? ?

## 2021-07-22 ENCOUNTER — Ambulatory Visit: Payer: Medicaid Other | Attending: Obstetrics and Gynecology

## 2021-07-22 ENCOUNTER — Ambulatory Visit: Payer: Medicaid Other | Admitting: *Deleted

## 2021-07-22 VITALS — BP 110/70 | HR 94

## 2021-07-22 DIAGNOSIS — O98113 Syphilis complicating pregnancy, third trimester: Secondary | ICD-10-CM | POA: Diagnosis not present

## 2021-07-22 DIAGNOSIS — Z3689 Encounter for other specified antenatal screening: Secondary | ICD-10-CM

## 2021-07-22 DIAGNOSIS — Z3A35 35 weeks gestation of pregnancy: Secondary | ICD-10-CM | POA: Diagnosis not present

## 2021-07-22 DIAGNOSIS — A530 Latent syphilis, unspecified as early or late: Secondary | ICD-10-CM | POA: Diagnosis not present

## 2021-07-22 DIAGNOSIS — Z3493 Encounter for supervision of normal pregnancy, unspecified, third trimester: Secondary | ICD-10-CM | POA: Insufficient documentation

## 2021-08-01 ENCOUNTER — Other Ambulatory Visit (HOSPITAL_COMMUNITY)
Admission: RE | Admit: 2021-08-01 | Discharge: 2021-08-01 | Disposition: A | Discharge status: Home / Self Care | Payer: Medicaid Other | Source: Ambulatory Visit | Attending: Student | Admitting: Student

## 2021-08-01 ENCOUNTER — Ambulatory Visit (INDEPENDENT_AMBULATORY_CARE_PROVIDER_SITE_OTHER): Payer: Medicaid Other | Admitting: Student

## 2021-08-01 VITALS — BP 122/80 | HR 107 | Wt 189.1 lb

## 2021-08-01 DIAGNOSIS — Z3A36 36 weeks gestation of pregnancy: Secondary | ICD-10-CM

## 2021-08-01 DIAGNOSIS — Z3493 Encounter for supervision of normal pregnancy, unspecified, third trimester: Secondary | ICD-10-CM | POA: Insufficient documentation

## 2021-08-01 NOTE — Patient Instructions (Signed)

## 2021-08-01 NOTE — Progress Notes (Signed)
? ?  PRENATAL VISIT NOTE ? ?Subjective:  ?Angela Sexton is a 31 y.o. G3P1011 at [redacted]w[redacted]d being seen today for ongoing prenatal care.  She is currently monitored for the following issues for this low-risk pregnancy and has Axillary lymphadenopathy; Elevated sed rate; B12 deficiency; Iron deficiency anemia; Folate deficiency; Hematuria with proteinuria; Supervision of low-risk pregnancy; Facial rash; Joint pain; Raynaud phenomenon; Eczema; Bronchogenic cyst; Family history of scleroderma; Syphilis complicating pregnancy, third trimester; Positive ANA (antinuclear antibody); and Hx of maternal laceration, 4th degree, currently pregnant on their problem list. ? ?Patient reports no complaints.  Contractions: Irritability. Vag. Bleeding: None.  Movement: Present. Denies leaking of fluid.  ? ?The following portions of the patient's history were reviewed and updated as appropriate: allergies, current medications, past family history, past medical history, past social history, past surgical history and problem list.  ? ?Objective:  ? ?Vitals:  ? 08/01/21 1325  ?BP: 122/80  ?Pulse: (!) 107  ?Weight: 189 lb 1.6 oz (85.8 kg)  ? ? ?Fetal Status: Fetal Heart Rate (bpm): 146   Movement: Present    ? ?General:  Alert, oriented and cooperative. Patient is in no acute distress.  ?Skin: Skin is warm and dry. No rash noted.   ?Cardiovascular: Normal heart rate noted  ?Respiratory: Normal respiratory effort, no problems with respiration noted  ?Abdomen: Soft, gravid, appropriate for gestational age.  Pain/Pressure: Present     ?Pelvic: Cervical exam deferred        ?Extremities: Normal range of motion.  Edema: Trace  ?Mental Status: Normal mood and affect. Normal behavior. Normal judgment and thought content.  ? ?Assessment and Plan:  ?Pregnancy: G3P1011 at [redacted]w[redacted]d ?1. Encounter for supervision of low-risk pregnancy in third trimester ?-patient has decided, based on fetal growth, that she wants to [facility] (she was considering C/section due to  history of 4th degree but reports that MDs here have given her support and reassurance for her choice to birth vaginally and she would like to do WB) ?-reviewed that, in the water, we will not be able assess her perineum and/or provide perineal support; she is ok with this. She feels good about natural childbirth and is ok with not being in bed. I explained, in detail, that many factors influence WB: pitocin, staffing, tub availability, Maternal or fetal risk factors, provider concerns.  She did not sign consent today, but she was given the information on how to sign up for wb class and states that she will do, explained that if she misses class on April 20 there will not be another class until May and she reports that she will attend class. She has appt with CNMs set up for the future.  ? ?- GC/Chlamydia probe amp ([place])not at [facility] ?- Culture, beta strep (group b only) ?-vertex by leopolds; cervix is long, closed, posterior ? ?Preterm labor symptoms and general obstetric precautions including but not limited to vaginal bleeding, contractions, leaking of fluid and fetal movement were reviewed in detail with the patient. ?Please refer to After Visit Summary for other counseling recommendations.  ? ?No follow-ups on file. ? ?Future Appointments  ?Date Time Provider Department Center  ?08/07/2021  3:55 PM Crissie Reese Mary Sella, MD [facility]  ?08/14/2021  8:55 AM Federico Flake, MD [facility]  ?08/21/2021  9:15 AM Bernerd Limbo, CNM [facility]  ?02/14/2022 10:40 AM Dimple Casey, Jamesetta Orleans, MD [facility] None  ? ? ?Marylene Land, CNM ? ?

## 2021-08-02 LAB — GC/CHLAMYDIA PROBE AMP (~~LOC~~) NOT AT ARMC
Chlamydia: NEGATIVE
Comment: NEGATIVE
Comment: NORMAL
Neisseria Gonorrhea: NEGATIVE

## 2021-08-05 LAB — CULTURE, BETA STREP (GROUP B ONLY): Strep Gp B Culture: NEGATIVE

## 2021-08-07 ENCOUNTER — Encounter: Payer: Self-pay | Admitting: Family Medicine

## 2021-08-07 ENCOUNTER — Ambulatory Visit (INDEPENDENT_AMBULATORY_CARE_PROVIDER_SITE_OTHER): Payer: Medicaid Other | Admitting: Family Medicine

## 2021-08-07 VITALS — BP 131/83 | HR 102 | Wt 193.8 lb

## 2021-08-07 DIAGNOSIS — O98113 Syphilis complicating pregnancy, third trimester: Secondary | ICD-10-CM

## 2021-08-07 DIAGNOSIS — O09299 Supervision of pregnancy with other poor reproductive or obstetric history, unspecified trimester: Secondary | ICD-10-CM

## 2021-08-07 DIAGNOSIS — Z3493 Encounter for supervision of normal pregnancy, unspecified, third trimester: Secondary | ICD-10-CM

## 2021-08-07 DIAGNOSIS — I7300 Raynaud's syndrome without gangrene: Secondary | ICD-10-CM

## 2021-08-07 NOTE — Patient Instructions (Signed)

## 2021-08-07 NOTE — Progress Notes (Signed)
? ?  Subjective:  ?Angela Sexton is a 31 y.o. G3P1011 at [redacted]w[redacted]d being seen today for ongoing prenatal care.  She is currently monitored for the following issues for this high-risk pregnancy and has Axillary lymphadenopathy; Elevated sed rate; B12 deficiency; Iron deficiency anemia; Folate deficiency; Hematuria with proteinuria; Supervision of low-risk pregnancy; Facial rash; Joint pain; Raynaud phenomenon; Eczema; Bronchogenic cyst; Family history of scleroderma; Syphilis complicating pregnancy, third trimester; Positive ANA (antinuclear antibody); and Hx of maternal laceration, 4th degree, currently pregnant on their problem list. ? ?Patient reports no complaints.  Contractions: Not present. Vag. Bleeding: None.  Movement: Present. Denies leaking of fluid.  ? ?The following portions of the patient's history were reviewed and updated as appropriate: allergies, current medications, past family history, past medical history, past social history, past surgical history and problem list. Problem list updated. ? ?Objective:  ? ?Vitals:  ? 08/07/21 1623  ?BP: 131/83  ?Pulse: (!) 102  ?Weight: 193 lb 12.8 oz (87.9 kg)  ? ? ?Fetal Status: Fetal Heart Rate (bpm): 147   Movement: Present  Presentation: Vertex ? ?General:  Alert, oriented and cooperative. Patient is in no acute distress.  ?Skin: Skin is warm and dry. No rash noted.   ?Cardiovascular: Normal heart rate noted  ?Respiratory: Normal respiratory effort, no problems with respiration noted  ?Abdomen: Soft, gravid, appropriate for gestational age. Pain/Pressure: Present     ?Pelvic: Vag. Bleeding: None     ?Cervical exam performed Dilation: 2 Effacement (%): Thick Station: -1  ?Extremities: Normal range of motion.  Edema: Trace  ?Mental Status: Normal mood and affect. Normal behavior. Normal judgment and thought content.  ? ?Urinalysis:     ? ?Assessment and Plan:  ?Pregnancy: G3P1011 at [redacted]w[redacted]d ? ?1. Encounter for supervision of low-risk pregnancy in third trimester ?BP  and FHR normal ?Has water birth class scheduled for tomorrow ?Thinking about taking a two day trip to [place], strongly advised against this if she does not want to risk possibility of delivering out of state ? ?2. Syphilis complicating pregnancy, third trimester ?S/p treatment ? ?3. Hx of maternal laceration, 4th degree, currently pregnant ?Previously offered and declined primary cesarean ? ?4. Raynaud's phenomenon without gangrene ?Possible diagnosis of lupus per chart ?Has SSA check on file that is normal but no SSB, recommend obtaining both at next visit, unable to do so at this visit as patient seen close to 1700 ? ?Term labor symptoms and general obstetric precautions including but not limited to vaginal bleeding, contractions, leaking of fluid and fetal movement were reviewed in detail with the patient. ?Please refer to After Visit Summary for other counseling recommendations.  ?Return in 1 week (on 08/14/2021) for [facility], ob visit. ? ? ?Clarnce Flock, MD ? ?

## 2021-08-08 ENCOUNTER — Encounter: Payer: Self-pay | Admitting: Family Medicine

## 2021-08-14 ENCOUNTER — Ambulatory Visit (INDEPENDENT_AMBULATORY_CARE_PROVIDER_SITE_OTHER): Payer: Medicaid Other | Admitting: Family Medicine

## 2021-08-14 VITALS — BP 117/84 | HR 91 | Wt 190.0 lb

## 2021-08-14 DIAGNOSIS — O0990 Supervision of high risk pregnancy, unspecified, unspecified trimester: Secondary | ICD-10-CM

## 2021-08-14 DIAGNOSIS — O98113 Syphilis complicating pregnancy, third trimester: Secondary | ICD-10-CM

## 2021-08-14 DIAGNOSIS — O09299 Supervision of pregnancy with other poor reproductive or obstetric history, unspecified trimester: Secondary | ICD-10-CM

## 2021-08-14 DIAGNOSIS — R768 Other specified abnormal immunological findings in serum: Secondary | ICD-10-CM

## 2021-08-14 DIAGNOSIS — Z3A38 38 weeks gestation of pregnancy: Secondary | ICD-10-CM

## 2021-08-14 NOTE — Progress Notes (Signed)
? ?  PRENATAL VISIT NOTE ? ?Subjective:  ?Angela Sexton is a 31 y.o. G3P1011 at [redacted]w[redacted]d being seen today for ongoing prenatal care.  She is currently monitored for the following issues for this high-risk pregnancy and has Axillary lymphadenopathy; Elevated sed rate; B12 deficiency; Iron deficiency anemia; Folate deficiency; Hematuria with proteinuria; Supervision of high risk pregnancy, antepartum; Facial rash; Joint pain; Raynaud phenomenon; Eczema; Bronchogenic cyst; Family history of scleroderma; Syphilis complicating pregnancy, third trimester; Positive ANA (antinuclear antibody); and Hx of maternal laceration, 4th degree, currently pregnant on their problem list. ? ?Patient reports no complaints.  Contractions: Irritability. Vag. Bleeding: None.  Movement: (!) Decreased. Denies leaking of fluid.  ? ?The following portions of the patient's history were reviewed and updated as appropriate: allergies, current medications, past family history, past medical history, past social history, past surgical history and problem list.  ? ?Objective:  ? ?Vitals:  ? 08/14/21 0901  ?BP: 117/84  ?Pulse: 91  ?Weight: 190 lb (86.2 kg)  ? ? ?Fetal Status: Fetal Heart Rate (bpm): 154   Movement: (!) Decreased    ? ?General:  Alert, oriented and cooperative. Patient is in no acute distress.  ?Skin: Skin is warm and dry. No rash noted.   ?Cardiovascular: Normal heart rate noted  ?Respiratory: Normal respiratory effort, no problems with respiration noted  ?Abdomen: Soft, gravid, appropriate for gestational age.  Pain/Pressure: Present     ?Pelvic: Cervical exam deferred        ?Extremities: Normal range of motion.  Edema: Trace  ?Mental Status: Normal mood and affect. Normal behavior. Normal judgment and thought content.  ? ?Assessment and Plan:  ?Pregnancy: G3P1011 at [redacted]w[redacted]d ?1. Hx of maternal laceration, 4th degree, currently pregnant ?Patient declined C-section, desires WB ?Growth [place] was wnl- 22nd% ? ?2. Positive ANA (antinuclear  antibody) ?Agreed to additional testing today ?- Sjogrens syndrome-A extractable nuclear antibody ?- Sjogrens syndrome-B extractable nuclear antibody ? ?3. Syphilis complicating pregnancy, third trimester ?Completed her PCN series ? ?4. Supervision of high risk pregnancy, antepartum ?Up to date ?Completed WB class and CNM visit, planning on WB and discussed coping strategies. Reviewed that there are reasons that might arise where WB is not an option and these will be reviewed on admission ?Cervical exam today-- favorable for sweeping is desired next appt ? ? ? ?Term labor symptoms and general obstetric precautions including but not limited to vaginal bleeding, contractions, leaking of fluid and fetal movement were reviewed in detail with the patient. ?Please refer to After Visit Summary for other counseling recommendations.  ? ?Return in about 1 week (around 08/21/2021) for Routine prenatal care. ? ?Future Appointments  ?Date Time Provider [place]  ?08/21/2021  9:15 AM Gabriel Carina, CNM [facility]  ?02/14/2022 10:40 AM Benjamine Mola, Resa Miner, MD [facility] None  ? ? ?Caren Macadam, MD ?

## 2021-08-15 ENCOUNTER — Encounter: Payer: Self-pay | Admitting: Family Medicine

## 2021-08-15 LAB — SJOGRENS SYNDROME-A EXTRACTABLE NUCLEAR ANTIBODY: ENA SSA (RO) Ab: <0.2 AI (ref 0.0–0.9)

## 2021-08-15 LAB — SJOGRENS SYNDROME-B EXTRACTABLE NUCLEAR ANTIBODY: ENA SSB (LA) Ab: <0.2 AI (ref 0.0–0.9)

## 2021-08-19 ENCOUNTER — Encounter (HOSPITAL_COMMUNITY): Payer: Self-pay | Admitting: Obstetrics and Gynecology

## 2021-08-19 ENCOUNTER — Other Ambulatory Visit: Payer: Self-pay

## 2021-08-19 ENCOUNTER — Inpatient Hospital Stay (HOSPITAL_COMMUNITY)
Admission: AD | Admit: 2021-08-19 | Discharge: 2021-08-20 | Disposition: A | Discharge status: Home / Self Care | Payer: Medicaid Other | Attending: Obstetrics and Gynecology | Admitting: Obstetrics and Gynecology

## 2021-08-19 DIAGNOSIS — O479 False labor, unspecified: Secondary | ICD-10-CM | POA: Insufficient documentation

## 2021-08-19 DIAGNOSIS — O0990 Supervision of high risk pregnancy, unspecified, unspecified trimester: Secondary | ICD-10-CM

## 2021-08-19 NOTE — MAU Note (Signed)
Pt says UC strong since 9pm ?[facility] with clinic ?VE - last Wed - 2-3 cm ?Denies HSV ?GBS- neg ?Wants waterbirth  ?

## 2021-08-20 DIAGNOSIS — O479 False labor, unspecified: Secondary | ICD-10-CM | POA: Diagnosis not present

## 2021-08-20 NOTE — Progress Notes (Signed)
Angela Sexton, CNM notified by phone of pt's SVE and uterine activity after 1 hr of monitoring, along with pt's plan for water birth. Offered pt to go home at this time, pt stating "I don't want to keep coming back and forth.. is there anyway I can walk around?" CNM stated to let pt walk around and to recheck cervix after an additional hour. RN verified with CNM that it is ok for pt to come off of FHR monitor.  ?

## 2021-08-21 ENCOUNTER — Ambulatory Visit (INDEPENDENT_AMBULATORY_CARE_PROVIDER_SITE_OTHER): Payer: Medicaid Other | Admitting: Certified Nurse Midwife

## 2021-08-21 ENCOUNTER — Encounter (HOSPITAL_COMMUNITY): Payer: Self-pay | Admitting: *Deleted

## 2021-08-21 ENCOUNTER — Telehealth (HOSPITAL_COMMUNITY): Payer: Self-pay | Admitting: *Deleted

## 2021-08-21 VITALS — BP 120/85 | HR 84 | Wt 193.1 lb

## 2021-08-21 DIAGNOSIS — O0993 Supervision of high risk pregnancy, unspecified, third trimester: Secondary | ICD-10-CM

## 2021-08-21 DIAGNOSIS — Z3A39 39 weeks gestation of pregnancy: Secondary | ICD-10-CM

## 2021-08-21 DIAGNOSIS — Z3493 Encounter for supervision of normal pregnancy, unspecified, third trimester: Secondary | ICD-10-CM

## 2021-08-21 DIAGNOSIS — O98113 Syphilis complicating pregnancy, third trimester: Secondary | ICD-10-CM

## 2021-08-21 NOTE — Progress Notes (Signed)
? ?  PRENATAL VISIT NOTE ? ?Subjective:  ?Angela Sexton is a 31 y.o. G3P1011 at [redacted]w[redacted]d being seen today for ongoing prenatal care.  She is currently monitored for the following issues for this low-risk pregnancy and has Axillary lymphadenopathy; Elevated sed rate; B12 deficiency; Iron deficiency anemia; Folate deficiency; Hematuria with proteinuria; Supervision of high risk pregnancy, antepartum; Facial rash; Joint pain; Raynaud phenomenon; Eczema; Bronchogenic cyst; Family history of scleroderma; Syphilis complicating pregnancy, third trimester; Positive ANA (antinuclear antibody); and Hx of maternal laceration, 4th degree, currently pregnant on their problem list. ? ?Patient reports occasional contractions.  Contractions: Irregular. Vag. Bleeding: None.  Movement: Present. Denies leaking of fluid.  ? ?The following portions of the patient's history were reviewed and updated as appropriate: allergies, current medications, past family history, past medical history, past social history, past surgical history and problem list.  ? ?Objective:  ? ?Vitals:  ? 08/21/21 0845  ?BP: 120/85  ?Pulse: 84  ?Weight: 193 lb 1.6 oz (87.6 kg)  ? ? ?Fetal Status: Fetal Heart Rate (bpm): 153 Fundal Height: 37 cm Movement: Present  Presentation: Vertex ? ?General:  Alert, oriented and cooperative. Patient is in no acute distress.  ?Skin: Skin is warm and dry. No rash noted.   ?Cardiovascular: Normal heart rate noted  ?Respiratory: Normal respiratory effort, no problems with respiration noted  ?Abdomen: Soft, gravid, appropriate for gestational age.  Pain/Pressure: Present     ?Pelvic: Cervical exam performed in the presence of a chaperone Dilation: 2 Effacement (%): Thick Station: -3  ?Extremities: Normal range of motion.  Edema: None  ?Mental Status: Normal mood and affect. Normal behavior. Normal judgment and thought content.  ? ?Assessment and Plan:  ?Pregnancy: G3P1011 at [redacted]w[redacted]d ?1. Supervision of low risk pregnancy in third  trimester ?- Doing well, feeling regular and vigorous fetal movement  ? ?2. [redacted] weeks gestation of pregnancy ?- Routine OB care  ?- Discussed r/b of IOL at 41wks, pt desires to schedule now. IOL scheduled for 09/01/21, orders in. ? ?3. Syphilis complicating pregnancy, third trimester ?- s/p treatment ? ?Term labor symptoms and general obstetric precautions including but not limited to vaginal bleeding, contractions, leaking of fluid and fetal movement were reviewed in detail with the patient. ?Please refer to After Visit Summary for other counseling recommendations.  ? ?Return in about 1 week (around 08/28/2021) for IN-PERSON, LOB. ? ?Future Appointments  ?Date Time Provider Department Center  ?08/29/2021  2:15 PM Worthy Rancher, MD [facility]  ?09/01/2021 12:00 AM [facility] SCHED ROOM [facility] None  ?02/14/2022 10:40 AM Rice, Jamesetta Orleans, MD [facility] None  ? ? ?Bernerd Limbo, CNM ?

## 2021-08-21 NOTE — Patient Instructions (Signed)

## 2021-08-21 NOTE — Progress Notes (Signed)
Pt states having Contractions but not sure how far apart they are. ?

## 2021-08-21 NOTE — Telephone Encounter (Signed)
Preadmission screen  

## 2021-08-22 ENCOUNTER — Encounter (HOSPITAL_COMMUNITY): Payer: Self-pay | Admitting: Obstetrics & Gynecology

## 2021-08-22 ENCOUNTER — Inpatient Hospital Stay (HOSPITAL_COMMUNITY)
Admission: AD | Admit: 2021-08-22 | Discharge: 2021-08-25 | DRG: 807 | Disposition: A | Discharge status: Home / Self Care | Payer: Medicaid Other | Attending: Family Medicine | Admitting: Family Medicine

## 2021-08-22 DIAGNOSIS — O4292 Full-term premature rupture of membranes, unspecified as to length of time between rupture and onset of labor: Principal | ICD-10-CM | POA: Diagnosis present

## 2021-08-22 DIAGNOSIS — O479 False labor, unspecified: Secondary | ICD-10-CM | POA: Diagnosis present

## 2021-08-22 DIAGNOSIS — O26893 Other specified pregnancy related conditions, third trimester: Secondary | ICD-10-CM | POA: Diagnosis present

## 2021-08-22 DIAGNOSIS — Z23 Encounter for immunization: Secondary | ICD-10-CM

## 2021-08-22 DIAGNOSIS — O0990 Supervision of high risk pregnancy, unspecified, unspecified trimester: Principal | ICD-10-CM

## 2021-08-22 DIAGNOSIS — Z3A39 39 weeks gestation of pregnancy: Secondary | ICD-10-CM

## 2021-08-22 LAB — CBC
HCT: 36.8 % (ref 36.0–46.0)
Hemoglobin: 12.3 g/dL (ref 12.0–15.0)
MCH: 30.1 pg (ref 26.0–34.0)
MCHC: 33.4 g/dL (ref 30.0–36.0)
MCV: 90 fL (ref 80.0–100.0)
Platelets: 263 10*3/uL (ref 150–400)
RBC: 4.09 MIL/uL (ref 3.87–5.11)
RDW: 13.2 % (ref 11.5–15.5)
WBC: 7.8 10*3/uL (ref 4.0–10.5)
nRBC: 0 % (ref 0.0–0.2)

## 2021-08-22 LAB — TYPE AND SCREEN
ABO/RH(D): A POS
Antibody Screen: NEGATIVE

## 2021-08-22 LAB — POCT FERN TEST: POCT Fern Test: POSITIVE

## 2021-08-22 MED ORDER — SODIUM CHLORIDE 0.9% FLUSH
3.0000 mL | Freq: Two times a day (BID) | INTRAVENOUS | Status: DC
  Administered 2021-08-22: 3 mL via INTRAVENOUS

## 2021-08-22 MED ORDER — OXYCODONE-ACETAMINOPHEN 5-325 MG PO TABS: 2.0000 | ORAL_TABLET | ORAL | Status: DC | PRN

## 2021-08-22 MED ORDER — ONDANSETRON HCL 4 MG/2ML IJ SOLN: 4.0000 mg | Freq: Four times a day (QID) | INTRAMUSCULAR | Status: DC | PRN

## 2021-08-22 MED ORDER — OXYTOCIN BOLUS FROM INFUSION
333.0000 mL | Freq: Once | INTRAVENOUS | Status: AC
  Administered 2021-08-23: 333 mL via INTRAVENOUS

## 2021-08-22 MED ORDER — SODIUM CHLORIDE 0.9 % IV SOLN: 250.0000 mL | INTRAVENOUS | Status: DC | PRN

## 2021-08-22 MED ORDER — FENTANYL CITRATE (PF) 100 MCG/2ML IJ SOLN
100.0000 ug | INTRAMUSCULAR | Status: DC | PRN
  Administered 2021-08-23: 100 ug via INTRAVENOUS
  Filled 2021-08-22: qty 2

## 2021-08-22 MED ORDER — TERBUTALINE SULFATE 1 MG/ML IJ SOLN
0.2500 mg | Freq: Once | INTRAMUSCULAR | Status: DC | PRN
Start: 2021-08-22 — End: 2021-08-23

## 2021-08-22 MED ORDER — SOD CITRATE-CITRIC ACID 500-334 MG/5ML PO SOLN: 30.0000 mL | ORAL | Status: DC | PRN

## 2021-08-22 MED ORDER — SODIUM CHLORIDE 0.9% FLUSH
3.0000 mL | INTRAVENOUS | Status: DC | PRN
Start: 2021-08-22 — End: 2021-08-23

## 2021-08-22 MED ORDER — TERBUTALINE SULFATE 1 MG/ML IJ SOLN: 0.2500 mg | Freq: Once | INTRAMUSCULAR | Status: DC | PRN

## 2021-08-22 MED ORDER — LACTATED RINGERS IV SOLN
500.0000 mL | INTRAVENOUS | Status: DC | PRN
  Administered 2021-08-22: 500 mL via INTRAVENOUS
  Administered 2021-08-22: 1000 mL via INTRAVENOUS

## 2021-08-22 MED ORDER — OXYCODONE-ACETAMINOPHEN 5-325 MG PO TABS: 1.0000 | ORAL_TABLET | ORAL | Status: DC | PRN

## 2021-08-22 MED ORDER — OXYTOCIN-SODIUM CHLORIDE 30-0.9 UT/500ML-% IV SOLN
1.0000 m[IU]/min | INTRAVENOUS | Status: DC
  Administered 2021-08-22: 2 m[IU]/min via INTRAVENOUS
  Filled 2021-08-22: qty 500

## 2021-08-22 MED ORDER — OXYTOCIN-SODIUM CHLORIDE 30-0.9 UT/500ML-% IV SOLN: 2.5000 [IU]/h | INTRAVENOUS | Status: DC

## 2021-08-22 MED ORDER — LIDOCAINE HCL (PF) 1 % IJ SOLN
30.0000 mL | INTRAMUSCULAR | Status: DC | PRN
Start: 2021-08-22 — End: 2021-08-23
  Filled 2021-08-22: qty 30

## 2021-08-22 MED ORDER — ACETAMINOPHEN 325 MG PO TABS: 650.0000 mg | ORAL_TABLET | ORAL | Status: DC | PRN

## 2021-08-22 MED ORDER — FLEET ENEMA 7-19 GM/118ML RE ENEM: 1.0000 | ENEMA | RECTAL | Status: DC | PRN

## 2021-08-22 MED ORDER — MISOPROSTOL 50MCG HALF TABLET
50.0000 ug | ORAL_TABLET | ORAL | Status: DC | PRN
  Administered 2021-08-22: 50 ug via BUCCAL
  Filled 2021-08-22: qty 1

## 2021-08-22 NOTE — Progress Notes (Signed)
Patient Vitals for the past 4 hrs: ? BP Temp Temp src Pulse Resp  ?08/22/21 2317 -- 98.4 ?F (36.9 ?C) Oral -- --  ?08/22/21 2232 (!) 135/99 -- -- 77 --  ?08/22/21 2202 (!) 117/59 -- -- 73 --  ?08/22/21 2157 123/73 -- -- 77 --  ?08/22/21 2045 136/78 97.7 ?F (36.5 ?C) Oral 75 16  ?08/22/21 1956 122/81 -- -- 76 16  ? ?Pt has been painfully contracting for over an hour, cx 5/80/-1.  FHR Cat 1.  Pitocin off, in tub.   ?

## 2021-08-22 NOTE — Discharge Summary (Signed)
? ?  Postpartum Discharge Summary ? ?Date of Service updated ? ?   ?Patient Name: Angela Sexton ?DOB: [birth year] ?MRN: [id number] ? ?Date of admission: 08/22/2021 ?Delivery date:08/23/2021  ?Delivering provider: Christin Fudge  ?Date of discharge: 08/24/2021 ? ?Admitting diagnosis: Indication for care in labor or delivery [O75.9] ?Uterine contractions during pregnancy [O47.9] ?Intrauterine pregnancy: [redacted]w[redacted]d    ?Secondary diagnosis:  Principal Problem: ?  Indication for care in labor or delivery ?Active Problems: ?  Uterine contractions during pregnancy ? ?Additional problems: PROM    ?Discharge diagnosis: Term Pregnancy Delivered                                            ?Post partum procedures: none ?Augmentation: Pitocin and Cytotec ?Complications: None ? ?Hospital course: Induction of Labor With Vaginal Delivery   ?31y.o. yo G3P1011 at 29w6das admitted to the hospital 08/22/2021 for induction of labor.  Indication for induction: PROM.  Patient had an uncomplicated labor course as follows: ?Membrane Rupture Time/Date: 9:45 AM ,08/22/2021   ?Delivery Method:Vaginal, Spontaneous  waterbirth ?Episiotomy: None  ?Lacerations:  None  ?Details of delivery can be found in separate delivery note.  Patient had a routine postpartum course. Patient is discharged home 08/24/21. ? ?Newborn Data: ?Birth date:08/23/2021  ?Birth time:12:53 AM  ?Gender:Female  ?Living status:Living  ?Apgars:8 ,9  ?Weight:2990 g  ? ?Magnesium Sulfate received: No ?BMZ received: No ?Rhophylac:N/A ?MMR:N/A ?T-DaP: offer prior to discharge ?Flu: No ?Transfusion:No ? ?Physical exam  ?Vitals:  ? 08/23/21 1211 08/23/21 1545 08/23/21 2015 08/24/21 0520  ?BP: 119/82 114/76 108/73 105/66  ?Pulse: 73 73 77 73  ?Resp: 20 17 18 18   ?Temp: 98.1 ?F (36.7 ?C) 98.3 ?F (36.8 ?C) 98.1 ?F (36.7 ?C) 98.5 ?F (36.9 ?C)  ?TempSrc: Axillary Oral Oral Oral  ?SpO2: 100% 100% 100% 100%  ? ?General: alert, cooperative, and no distress ?Lochia: appropriate ?Uterine Fundus:  firm ?Incision: N/A ?DVT Evaluation: No evidence of DVT seen on physical exam. ?Labs: ?Lab Results  ?Component Value Date  ? WBC 8.1 08/24/2021  ? HGB 10.0 (L) 08/24/2021  ? HCT 30.7 (L) 08/24/2021  ? MCV 89.5 08/24/2021  ? PLT 225 08/24/2021  ? ? ?  Latest Ref Rng & Units 11/06/2020  ?  1:01 PM  ?CMP  ?Glucose 70 - 99 mg/dL 80    ?BUN 6 - 20 mg/dL 8    ?Creatinine 0.44 - 1.00 mg/dL 0.67    ?Sodium 135 - 145 mmol/L 144    ?Potassium 3.5 - 5.1 mmol/L 3.5    ?Chloride 98 - 111 mmol/L 105    ?CO2 22 - 32 mmol/L 31    ?Calcium 8.9 - 10.3 mg/dL 9.3    ?Total Protein 6.5 - 8.1 g/dL 7.7    ?Total Bilirubin 0.3 - 1.2 mg/dL 0.7    ?Alkaline Phos 38 - 126 U/L 60    ?AST 15 - 41 U/L 11    ?ALT 0 - 44 U/L 8    ? ?Edinburgh Score: ?   ? View : No data to display.  ?  ?  ?  ? ? ? ?After visit meds:  ?Allergies as of 08/24/2021   ? ?   Reactions  ? Sulfa Antibiotics   ? Nausea/vomiting, weakness  ? ?  ? ?  ?Medication List  ?  ? ?TAKE these medications   ? ?  ferrous sulfate 325 (65 FE) MG tablet ?Take 1 tablet (325 mg total) by mouth every other day. ?  ?folic acid 1 MG tablet ?Commonly known as: FOLVITE ?Take 1 tablet (1 mg total) by mouth daily. ?  ?ibuprofen 600 MG tablet ?Commonly known as: ADVIL ?Take 1 tablet (600 mg total) by mouth every 6 (six) hours. ?  ?prenatal vitamin w/FE, FA 27-1 MG Tabs tablet ?Take 1 tablet by mouth daily at 12 noon. ?  ? ?  ? ? ? ?Discharge home in stable condition ?Infant Feeding: Breast ?Infant Disposition:home with mother ?Discharge instruction: per After Visit Summary and Postpartum booklet. ?Activity: Advance as tolerated. Pelvic rest for 6 weeks.  ?Diet: routine diet ?Future Appointments: ?Future Appointments  ?Date Time Provider University  ?08/29/2021  2:15 PM Genia Del, MD [facility]  ?02/14/2022 10:40 AM Benjamine Mola Resa Miner, MD [facility] None  ? ?Follow up Visit: ? ? ?Please schedule this patient for a Virtual postpartum visit in 4 weeks with the following provider: Any  provider. ?Additional Postpartum F/U: none ?Low risk pregnancy complicated by: none ?Delivery mode:  Vaginal, Spontaneous  ?Anticipated Birth Control:   Declines ? ? ?08/24/2021 ?Renee Harder, CNM ? ? ? ?

## 2021-08-22 NOTE — MAU Note (Addendum)
Angela Sexton is a 31 y.o. at [redacted]w[redacted]d here in MAU reporting: rupture of membranes with a moderate amount of clear fluid at 0945 this morning. Denies vaginal bleeding. Pt states she is feeling crampy but denies feeling ctx. +FM.  ?Onset of complaint:  ?Pain score: 5/10 ?Vitals:  ? 08/22/21 1204 08/22/21 1206  ?BP:  120/84  ?Pulse:  (!) 103  ?SpO2: 100% 100%  ?   ?FHT:150 ?Lab orders placed from triage: Crist Fat test ?

## 2021-08-22 NOTE — Progress Notes (Signed)
Patient Vitals for the past 4 hrs: ? BP Temp Temp src Pulse Resp  ?08/22/21 2045 136/78 97.7 ?F (36.5 ?C) Oral 75 16  ?08/22/21 1956 122/81 -- -- 76 16  ?08/22/21 1911 119/75 98.4 ?F (36.9 ?C) Axillary 79 --  ?08/22/21 1816 118/86 -- -- 84 --  ? ?Pt did not have any noticeable cx change from cytotec.  Offered and accepted pitocin, now at 6 mu/min.  Starting to feel some ctx.  FHR cat 1, irregular ctx.  Continue present mgt.  Plan to DC Pitocin once labor active if she still wants to try for a waterbirth.  ?

## 2021-08-22 NOTE — H&P (Signed)
OBSTETRIC ADMISSION HISTORY AND PHYSICAL ? ?Angela Sexton is a 31 y.o. female G2P1011 with IUP at [redacted]w[redacted]d by [place] presenting for SROM at [facility] on 05/04. She reports +FMs, No LOF, no VB, no blurry vision, headaches or peripheral edema, and RUQ pain.  She plans on breast feeding. She desires OCPs but is undecided for birth control. ?She received her prenatal care at  [facility]   ? ?Dating: By [place] --->  Estimated Date of Delivery: 08/24/21 ? ?Sono:   ? ?@[redacted]w[redacted]d , CWD, normal anatomy, cephalic presentation, [id number], 22% EFW ? ? ?Prenatal History/Complications: syphilis complicating third trimester, positive ANA with elevated sed rate, OBHx 4th degree laceration, B12, iron, and folate deficiency, raynaud phenomenon, bronchogenic cyst, hematuria with proteinuria ? ?Past Medical History: ?Past Medical History:  ?Diagnosis Date  ? Eczema   ? ? ?Past Surgical History: ?Past Surgical History:  ?Procedure Laterality Date  ? WISDOM TOOTH EXTRACTION    ? ? ?Obstetrical History: ?OB History   ? ? Gravida  ?3  ? Para  ?1  ? Term  ?1  ? Preterm  ?0  ? AB  ?1  ? Living  ?1  ?  ? ? SAB  ?1  ? IAB  ?0  ? Ectopic  ?0  ? Multiple  ?0  ? Live Births  ?1  ?   ?  ?  ? ? ?Social History ?Social History  ? ?Socioeconomic History  ? Marital status: Single  ?  Spouse name: Not on file  ? Number of children: Not on file  ? Years of education: Not on file  ? Highest education level: Not on file  ?Occupational History  ? Not on file  ?Tobacco Use  ? Smoking status: Never  ? Smokeless tobacco: Never  ?Vaping Use  ? Vaping Use: Never used  ?Substance and Sexual Activity  ? Alcohol use: Never  ? Drug use: Never  ? Sexual activity: Yes  ?Other Topics Concern  ? Not on file  ?Social History Narrative  ? Not on file  ? ?Social Determinants of Health  ? ?Financial Resource Strain: Not on file  ?Food Insecurity: Not on file  ?Transportation Needs: Not on file  ?Physical Activity: Not on file  ?Stress: Not on file  ?Social Connections: Not on file  ? ? ?Family  History: ?Family History  ?Problem Relation Age of Onset  ? Asthma Mother   ? Eczema Mother   ? Scleroderma Father   ? Melanoma Maternal Grandfather   ? Lung disease Neg Hx   ? ? ?Allergies: ?Allergies  ?Allergen Reactions  ? Sulfa Antibiotics   ?  Nausea/vomiting, weakness  ? ? ?Medications Prior to Admission  ?Medication Sig Dispense Refill Last Dose  ? ferrous sulfate 325 (65 FE) MG tablet Take 1 tablet (325 mg total) by mouth every other day. 100 tablet 2 08/21/2021 at 0800  ? folic acid (FOLVITE) 1 MG tablet Take 1 tablet (1 mg total) by mouth daily. 30 tablet 11 08/21/2021 at 0800  ? prenatal vitamin w/FE, FA (PRENATAL 1 + 1) 27-1 MG TABS tablet Take 1 tablet by mouth daily at 12 noon. 30 tablet 11 08/21/2021 at 0800  ? ? ? ?Review of Systems  ? ?All systems reviewed and negative except as stated in HPI ? ?Blood pressure 116/79, pulse 93, temperature 98.3 ?F (36.8 ?C), temperature source Oral, resp. rate 20, last menstrual period 11/25/2020, SpO2 100 %. ?General appearance: alert, cooperative, appears stated age, and no distress ?Lungs: clear to  auscultation bilaterally ?Heart: regular rate and rhythm ?Abdomen: soft, non-tender; bowel sounds normal ?Pelvic: WNL ?Extremities: Homans sign is negative, no sign of DVT ?Presentation: cephalic ?Fetal monitoringBaseline: 145 bpm, Variability: Good {> 6 bpm), Accelerations: present; 15x15, and Decelerations: Absent ?Uterine activity: irregular ?Dilation: 2 ?Effacement (%): 50 ?Station: -2 ?Exam by:: H Flippin RN ? ? ?Prenatal labs: ?ABO, Rh: --/--/A POS (05/04 1311) ?Antibody: NEG (05/04 1311) ?Rubella: 6.00 (01/18 1025) ?RPR: Reactive (02/10 0836)  ?HBsAg: Negative (01/18 1025)  ?HIV: Non Reactive (02/10 0836)  ?GBS: Negative/-- (04/13 1424)  ?1 hr Glucola 79 ?Genetic screening  negative ?Anatomy US normal ? ? ?Nursing Staff Provider  ?Office Location [place] Dating   Early [place]  ?PNC Model [x Traditional ?[ ]  Centering ?[ ]  Mom-Baby Dyad    ?Language  English Anatomy US    normal with follow up for + RPR  ?Flu Vaccine   Genetic/Carrier Screen  NIPS:    ?AFP:    ?Horizon: Neg 4/4  ?TDaP Vaccine    Hgb A1C or  ?GTT Early  ?Third trimester  ?Glucose, Fasting 70 - 91 mg/dL 79   ?Glucose, 1 hour 70 - 179 mg/dL 98   ?Glucose, 2 hour 70 - 152 mg/dL 94   ? ?  ?COVID Vaccine    LAB RESULTS   ?Rhogam  NA Blood Type A/Positive/-- (01/18 1025)   ?Baby Feeding Plan Breast/Bottle Antibody Negative (01/18 1025)  ?Contraception OC P Rubella 6.00 (01/18 1025)  ?Circumcision  NA RPR Reactive (01/18 1025) NOTE  ?Pediatrician   HBsAg Negative (01/18 1025)   ?Support Person Angela Sexton (FOB) HCVAb Negative 05-08-21  ?Prenatal Classes  HIV Non Reactive (01/18 1025)     ?BTL Consent  GBS   ?(For PCN allergy, check sensitivities) GBS neg  ?VBAC Consent  Pap  03-19-21  ?     ?DME Rx [ ]  BP cuff ?[ ]  Weight Scale Waterbirth  [ ]  Class [ ]  Consent [x ] CNM visit  ?PHQ9 & GAD7 [  ] new OB ?[  ] 28 weeks  ?[  ] 36 weeks Induction  [ ]  Orders Entered [ ] Foley Y/N  ? ? ? ?Prenatal Transfer Tool  ?Maternal Diabetes: No ?Genetic Screening: Normal ?Maternal Ultrasounds/Referrals: Normal ?Fetal Ultrasounds or other Referrals:  None ?Maternal Substance Abuse:  No ?Significant Maternal Medications:  None ?Significant Maternal Lab Results: Group B Strep negative and Other: RPR + with adequate treatment ? ?Results for orders placed or performed during the hospital encounter of 08/22/21 (from the past 24 hour(s))  ?Fern Test  ? Collection Time: 08/22/21 12:28 PM  ?Result Value Ref Range  ? POCT Fern Test Positive = ruptured amniotic membanes   ?CBC  ? Collection Time: 08/22/21  1:07 PM  ?Result Value Ref Range  ? WBC 7.8 4.0 - 10.5 K/uL  ? RBC 4.09 3.87 - 5.11 MIL/uL  ? Hemoglobin 12.3 12.0 - 15.0 g/dL  ? HCT 36.8 36.0 - 46.0 %  ? MCV 90.0 80.0 - 100.0 fL  ? MCH 30.1 26.0 - 34.0 pg  ? MCHC 33.4 30.0 - 36.0 g/dL  ? RDW 13.2 11.5 - 15.5 %  ? Platelets 263 150 - 400 K/uL  ? nRBC 0.0 0.0 - 0.2 %  ?Type and screen  ?  Collection Time: 08/22/21  1:11 PM  ?Result Value Ref Range  ? ABO/RH(D) A POS   ? Antibody Screen NEG   ? Sample Expiration    ?  08/25/2021,2359 ?Performed at [facility]  [facility], [place] [address] [place], [place] [address] ?  ? ? ?Patient Active Problem List  ? Diagnosis Date Noted  ? Indication for care in labor or delivery 08/22/2021  ? Uterine contractions during pregnancy 08/22/2021  ? Hx of maternal laceration, 4th degree, currently pregnant 06/05/2021  ? Positive ANA (antinuclear antibody) 05/18/2021  ? Syphilis complicating pregnancy, third trimester 05/09/2021  ? Family history of scleroderma 05/08/2021  ? Supervision of high risk pregnancy, antepartum 04/29/2021  ? Raynaud phenomenon 02/05/2021  ? Joint pain 01/19/2021  ? Facial rash 12/07/2020  ? Elevated sed rate 11/09/2020  ? B12 deficiency 11/09/2020  ? Iron deficiency anemia 11/09/2020  ? Folate deficiency 11/09/2020  ? Hematuria with proteinuria 11/09/2020  ? Axillary lymphadenopathy 10/09/2020  ? Eczema 10/05/2014  ? Bronchogenic cyst 04/21/2014  ? ? ?Assessment/Plan:  ?Leilynn Cid is a 31 y.o. G3P1011 at [redacted]w[redacted]d here for SROM at [place] on 05/04.  ? ?#Labor:cytotec ?#Pain: Natural; *waterbirth* ?#FWB: Cat 1 ?#ID:  N/a ?#MOF: breast ?#MOC: OCPs v. undecided ?#Circ:  N/a ? ?Labrina Lines S Gearldine Looney, Student-MidWife  ?08/22/2021, 4:10 PM ? ?  ?

## 2021-08-22 NOTE — Progress Notes (Signed)
Pt informed that the ultrasound is considered a limited OB ultrasound and is not intended to be a complete ultrasound exam.  Patient also informed that the ultrasound is not being completed with the intent of assessing for fetal or placental anomalies or any pelvic abnormalities.  Explained that the purpose of today?s ultrasound is to assess for  presentation.  Patient acknowledges the purpose of the exam and the limitations of the study.    ? ?Vertex presentation confirmed.  ? ?Rolm Bookbinder, CNM ?08/22/21 ?12:36 PM ? ?

## 2021-08-23 ENCOUNTER — Other Ambulatory Visit: Payer: Self-pay

## 2021-08-23 ENCOUNTER — Encounter (HOSPITAL_COMMUNITY): Payer: Self-pay | Admitting: Obstetrics and Gynecology

## 2021-08-23 DIAGNOSIS — Z3A39 39 weeks gestation of pregnancy: Secondary | ICD-10-CM | POA: Diagnosis not present

## 2021-08-23 LAB — RPR
RPR Ser Ql: REACTIVE — AB
RPR Titer: 1:32 {titer}

## 2021-08-23 MED ORDER — TETANUS-DIPHTH-ACELL PERTUSSIS 5-2.5-18.5 LF-MCG/0.5 IM SUSY
0.5000 mL | PREFILLED_SYRINGE | Freq: Once | INTRAMUSCULAR | Status: AC
  Administered 2021-08-25: 0.5 mL via INTRAMUSCULAR
  Filled 2021-08-23: qty 0.5

## 2021-08-23 MED ORDER — ACETAMINOPHEN 325 MG PO TABS
650.0000 mg | ORAL_TABLET | ORAL | Status: DC | PRN
  Administered 2021-08-23: 650 mg via ORAL
  Filled 2021-08-23: qty 2

## 2021-08-23 MED ORDER — MEDROXYPROGESTERONE ACETATE 150 MG/ML IM SUSP: 150.0000 mg | INTRAMUSCULAR | Status: DC | PRN

## 2021-08-23 MED ORDER — FLEET ENEMA 7-19 GM/118ML RE ENEM: 1.0000 | ENEMA | Freq: Every day | RECTAL | Status: DC | PRN

## 2021-08-23 MED ORDER — DIBUCAINE (PERIANAL) 1 % EX OINT: 1.0000 "application " | TOPICAL_OINTMENT | CUTANEOUS | Status: DC | PRN

## 2021-08-23 MED ORDER — ACETAMINOPHEN 500 MG PO TABS
1000.0000 mg | ORAL_TABLET | Freq: Four times a day (QID) | ORAL | Status: DC | PRN
  Administered 2021-08-23 (×2): 1000 mg via ORAL
  Filled 2021-08-23 (×2): qty 2

## 2021-08-23 MED ORDER — WITCH HAZEL-GLYCERIN EX PADS: 1.0000 "application " | MEDICATED_PAD | CUTANEOUS | Status: DC | PRN

## 2021-08-23 MED ORDER — DIPHENHYDRAMINE HCL 25 MG PO CAPS: 25.0000 mg | ORAL_CAPSULE | Freq: Four times a day (QID) | ORAL | Status: DC | PRN

## 2021-08-23 MED ORDER — COCONUT OIL OIL
1.0000 | TOPICAL_OIL | Status: DC | PRN
Start: 2021-08-23 — End: 2021-08-25
  Administered 2021-08-23: 1 via TOPICAL

## 2021-08-23 MED ORDER — MEASLES, MUMPS & RUBELLA VAC IJ SOLR: 0.5000 mL | Freq: Once | INTRAMUSCULAR | Status: DC

## 2021-08-23 MED ORDER — PRENATAL MULTIVITAMIN CH
1.0000 | ORAL_TABLET | Freq: Every day | ORAL | Status: DC
  Administered 2021-08-23 – 2021-08-25 (×3): 1 via ORAL
  Filled 2021-08-23 (×3): qty 1

## 2021-08-23 MED ORDER — ONDANSETRON HCL 4 MG PO TABS: 4.0000 mg | ORAL_TABLET | ORAL | Status: DC | PRN

## 2021-08-23 MED ORDER — OXYCODONE HCL 5 MG PO TABS
5.0000 mg | ORAL_TABLET | Freq: Four times a day (QID) | ORAL | Status: DC | PRN
  Administered 2021-08-23 – 2021-08-25 (×8): 5 mg via ORAL
  Filled 2021-08-23 (×8): qty 1

## 2021-08-23 MED ORDER — METHYLERGONOVINE MALEATE 0.2 MG PO TABS: 0.2000 mg | ORAL_TABLET | ORAL | Status: DC | PRN

## 2021-08-23 MED ORDER — SIMETHICONE 80 MG PO CHEW: 80.0000 mg | CHEWABLE_TABLET | ORAL | Status: DC | PRN

## 2021-08-23 MED ORDER — FERROUS SULFATE 325 (65 FE) MG PO TABS
325.0000 mg | ORAL_TABLET | ORAL | Status: DC
  Administered 2021-08-23 – 2021-08-25 (×2): 325 mg via ORAL
  Filled 2021-08-23 (×2): qty 1

## 2021-08-23 MED ORDER — DOCUSATE SODIUM 100 MG PO CAPS
100.0000 mg | ORAL_CAPSULE | Freq: Two times a day (BID) | ORAL | Status: DC
Start: 2021-08-23 — End: 2021-08-25
  Administered 2021-08-23 – 2021-08-25 (×4): 100 mg via ORAL
  Filled 2021-08-23 (×4): qty 1

## 2021-08-23 MED ORDER — BENZOCAINE-MENTHOL 20-0.5 % EX AERO
1.0000 "application " | INHALATION_SPRAY | CUTANEOUS | Status: DC | PRN
  Administered 2021-08-23 – 2021-08-25 (×2): 1 via TOPICAL
  Filled 2021-08-23 (×2): qty 56

## 2021-08-23 MED ORDER — BISACODYL 10 MG RE SUPP
10.0000 mg | Freq: Every day | RECTAL | Status: DC | PRN
Start: 2021-08-23 — End: 2021-08-25

## 2021-08-23 MED ORDER — IBUPROFEN 600 MG PO TABS
600.0000 mg | ORAL_TABLET | Freq: Four times a day (QID) | ORAL | Status: DC
  Administered 2021-08-23 – 2021-08-25 (×9): 600 mg via ORAL
  Filled 2021-08-23 (×10): qty 1

## 2021-08-23 MED ORDER — ONDANSETRON HCL 4 MG/2ML IJ SOLN
4.0000 mg | INTRAMUSCULAR | Status: DC | PRN
Start: 2021-08-23 — End: 2021-08-25

## 2021-08-23 MED ORDER — METHYLERGONOVINE MALEATE 0.2 MG/ML IJ SOLN: 0.2000 mg | INTRAMUSCULAR | Status: DC | PRN

## 2021-08-23 NOTE — Progress Notes (Signed)
Pt says uterine cramping pain is not sufficiently relieved by 1 gram Tylenol and heat packs to lower abdomen.  She is requesting stronger pain medication. ? ?Phoned Dr. Annia Friendly with above information.  Awaiting new orders. ?

## 2021-08-23 NOTE — Lactation Note (Signed)
This note was copied from a baby's chart. ?Lactation Consultation Note ? ?Patient Name: Angela Sexton ?Today's Date: 08/23/2021 ?Reason for consult: Initial assessment;Term ?Age:29 hours ? ? ?P2 mother whose infant is now 13 hours old.  This is a term baby at 39+6 weeks.  Mother's current feeding preference is breast.  She breast fed her first child (now 55 years old) for 6 months. ? ?Baby (No name yet) was asleep in the bassinet when I arrived.  Reviewed breast feeding basics with mother.  She has been able to hand express colostrum.  Mother remarked that it is painful when baby is feeding.  Suggested she call for my assistance with the next feeding so I can assess the latch.  Mother interested. ? ?Encouraged to feed 8-12 times/24 hours of sooner if she shows feeding cues.  Father present. ? ? ?Maternal Data ?Has patient been taught Hand Expression?: Yes ?Does the patient have breastfeeding experience prior to this delivery?: Yes ?How long did the patient breastfeed?: 6 months ? ?Feeding ?Mother's Current Feeding Choice: Breast Milk ? ?LATCH Score ?Latch: Grasps breast easily, tongue down, lips flanged, rhythmical sucking. ? ?Audible Swallowing: A few with stimulation ? ?Type of Nipple: Everted at rest and after stimulation ? ?Comfort (Breast/Nipple): Filling, red/small blisters or bruises, mild/mod discomfort ? ?Hold (Positioning): Assistance needed to correctly position infant at breast and maintain latch. ? ?LATCH Score: 7 ? ? ?Lactation Tools Discussed/Used ?Tools: Coconut oil ? ?Interventions ?Interventions: Breast feeding basics reviewed;Education;LC Services brochure ? ?Discharge ?Pump: Personal ? ?Consult Status ?Consult Status: Follow-up ?Date: 08/24/21 ?Follow-up type: In-patient ? ? ? ?Ilina Xu R Sonnie Pawloski ?08/23/2021, 11:35 AM ? ? ? ?

## 2021-08-23 NOTE — Lactation Note (Signed)
This note was copied from a baby's chart. ?Lactation Consultation Note ? ?Patient Name: Girl Maki Foulk ?Today's Date: 08/23/2021 ?  ?Age:29 hours ?Per RN Ubaldo Glassing) , Mom declined [facility] services in L&D, would like to be seen in morning on MBU.  ?Maternal Data ?  ? ?Feeding ?  ? ?LATCH Score ?  ? ?  ? ?  ? ?  ? ?  ? ?  ? ? ?Lactation Tools Discussed/Used ?  ? ?Interventions ?  ? ?Discharge ?  ? ?Consult Status ?  ? ? ? ?Vicente Serene ?08/23/2021, 1:31 AM ? ? ? ?

## 2021-08-23 NOTE — Lactation Note (Signed)
This note was copied from a baby's chart. ?Lactation Consultation Note ? ?Patient Name: Angela Sexton ?Today's Date: 08/23/2021 ?Reason for consult: Follow-up assessment;Term ?Age:29 hours ? ? ?Lactation Follow Up Consult: ? ?Mother requested latch assistance as discussed in my earlier visit. ? ?RN had assisted with latch.  When I arrived, baby appeared to be latched well, however, she was asleep at the breast.  Discussed the importance of gentle stimulation and keeping her actively engaged with her feeding.  Asked her to remove baby from the breast if she is not feeding and hold her STS.  Mother removed baby and she stayed asleep. ? ?Mother reported that the latch was not painful with the RN assisting.  Discussed how to be sure the latch is deep.  Mother eager to learn and appreciative of assistance.  She may call as needed. ? ? ?Maternal Data ?Has patient been taught Hand Expression?: Yes ?Does the patient have breastfeeding experience prior to this delivery?: Yes ?How long did the patient breastfeed?: 6 months ? ?Feeding ?Mother's Current Feeding Choice: Breast Milk ? ?LATCH Score ?Latch: Grasps breast easily, tongue down, lips flanged, rhythmical sucking. ? ?Audible Swallowing: A few with stimulation ? ?Type of Nipple: Everted at rest and after stimulation ? ?Comfort (Breast/Nipple): Filling, red/small blisters or bruises, mild/mod discomfort ? ?Hold (Positioning): Assistance needed to correctly position infant at breast and maintain latch. ? ?LATCH Score: 7 ? ? ?Lactation Tools Discussed/Used ?Tools: Coconut oil ? ?Interventions ?Interventions: Breast feeding basics reviewed;Education;LC Services brochure ? ?Discharge ?Pump: Personal ? ?Consult Status ?Consult Status: Follow-up ?Date: 08/24/21 ?Follow-up type: In-patient ? ? ? ?Tamica Covell R Onna Nodal ?08/23/2021, 12:44 PM ? ? ? ?

## 2021-08-23 NOTE — Lactation Note (Signed)
This note was copied from a baby's chart. ?Lactation Consultation Note ? ?Patient Name: Girl Grazia Honaker ?Today's Date: 08/23/2021 ?  ?Age:29 hours ? ? ?LC Note: ? ?Per RN, mother desired to rest and "quiet sign" on her door.  Will follow up later today. ? ? ?Maternal Data ?  ? ?Feeding ?  ? ?LATCH Score ?  ? ?  ? ?  ? ?  ? ?  ? ?  ? ? ?Lactation Tools Discussed/Used ?  ? ?Interventions ?  ? ?Discharge ?  ? ?Consult Status ?  ? ? ? ?Magdalen Cabana R Roberta Angell ?08/23/2021, 8:16 AM ? ? ? ?

## 2021-08-23 NOTE — Progress Notes (Signed)
Patient complaining lower abdominal uterine cramping unrelieved by Tylenol or ibuprofen.  She is requesting something stronger for pain.   ?Patient voiding without difficulty.  LBM during labor last night. ? ?Called Dr. Oley Balm with above information.  See order for increased dose of Tylenol.  She said she would consider ordering something stronger if the new dose of Tylenol doesn't work. ?

## 2021-08-24 LAB — CBC
HCT: 30.7 % — ABNORMAL LOW (ref 36.0–46.0)
Hemoglobin: 10 g/dL — ABNORMAL LOW (ref 12.0–15.0)
MCH: 29.2 pg (ref 26.0–34.0)
MCHC: 32.6 g/dL (ref 30.0–36.0)
MCV: 89.5 fL (ref 80.0–100.0)
Platelets: 225 10*3/uL (ref 150–400)
RBC: 3.43 MIL/uL — ABNORMAL LOW (ref 3.87–5.11)
RDW: 13.3 % (ref 11.5–15.5)
WBC: 8.1 10*3/uL (ref 4.0–10.5)
nRBC: 0 % (ref 0.0–0.2)

## 2021-08-24 MED ORDER — IBUPROFEN 600 MG PO TABS: 600.0000 mg | ORAL_TABLET | Freq: Four times a day (QID) | ORAL | 0 refills | Status: DC

## 2021-08-25 MED ORDER — OXYCODONE HCL 5 MG PO TABS: 5.0000 mg | ORAL_TABLET | Freq: Four times a day (QID) | ORAL | 0 refills | Status: DC | PRN

## 2021-08-25 NOTE — Discharge Summary (Signed)
? ?  Postpartum Discharge Summary ? ?Date of Service updated 08/25/21 ? ?   ?Patient Name: Angela Sexton ?DOB: 10/20/1990 ?MRN: [id number] ? ?Date of admission: 08/22/2021 ?Delivery date:08/23/2021  ?Delivering provider: Christin Fudge  ?Date of discharge: 08/25/2021 ? ?Admitting diagnosis: Indication for care in labor or delivery [O75.9] ?Uterine contractions during pregnancy [O47.9] ?Intrauterine pregnancy: [redacted]w[redacted]d    ?Secondary diagnosis:  Principal Problem: ?  Indication for care in labor or delivery ?Active Problems: ?  Uterine contractions during pregnancy ? ?Additional problems: syphilis in pregnancy, titer 1:32    ?Discharge diagnosis: Term Pregnancy Delivered                                              ?Post partum procedures: none ?Augmentation: Pitocin and Cytotec ?Complications: None ? ?Hospital course: Onset of Labor With Vaginal Delivery      ?31y.o. yo [id number] 323w6das admitted in Latent Labor on 08/22/2021. Patient had an uncomplicated labor course as follows:  ?Membrane Rupture Time/Date: 9:45 AM ,08/22/2021   ?Delivery Method:Vaginal, Spontaneous  ?Episiotomy: None  ?Lacerations:  None  ?Patient had an uncomplicated postpartum course.  She is ambulating, tolerating a regular diet, passing flatus, and urinating well. Patient is discharged home in stable condition on 08/25/21. ? ?Newborn Data: ?Birth date:08/23/2021  ?Birth time:12:53 AM  ?Gender:Female  ?Living status:Living  ?Apgars:8 ,9  ?Weight:2990 g  ? ?Magnesium Sulfate received: No ?BMZ received: No ?Rhophylac:No ?MMR:No ?T-DaP:Given prenatally ?Flu: No ?Transfusion:No ? ?Physical exam  ?Vitals:  ? 08/24/21 0520 08/24/21 1500 08/24/21 2302 08/25/21 0520  ?BP: 105/66 117/86 112/74 108/70  ?Pulse: 73 86 90 73  ?Resp: 18 18 16 18   ?Temp: 98.5 ?F (36.9 ?C) 98 ?F (36.7 ?C) 98.7 ?F (37.1 ?C) 98.8 ?F (37.1 ?C)  ?TempSrc: Oral Axillary Oral Oral  ?SpO2: 100%  100% 99%  ? ?General: alert, cooperative, and no distress ?Lochia: appropriate ?Uterine  Fundus: firm ?Incision: N/A ?DVT Evaluation: No evidence of DVT seen on physical exam. ?Negative Homan's sign. ?No cords or calf tenderness. ?Labs: ?Lab Results  ?Component Value Date  ? WBC 8.1 08/24/2021  ? HGB 10.0 (L) 08/24/2021  ? HCT 30.7 (L) 08/24/2021  ? MCV 89.5 08/24/2021  ? PLT 225 08/24/2021  ? ? ?  Latest Ref Rng & Units 11/06/2020  ?  1:01 PM  ?CMP  ?Glucose 70 - 99 mg/dL 80    ?BUN 6 - 20 mg/dL 8    ?Creatinine 0.44 - 1.00 mg/dL 0.67    ?Sodium 135 - 145 mmol/L 144    ?Potassium 3.5 - 5.1 mmol/L 3.5    ?Chloride 98 - 111 mmol/L 105    ?CO2 22 - 32 mmol/L 31    ?Calcium 8.9 - 10.3 mg/dL 9.3    ?Total Protein 6.5 - 8.1 g/dL 7.7    ?Total Bilirubin 0.3 - 1.2 mg/dL 0.7    ?Alkaline Phos 38 - 126 U/L 60    ?AST 15 - 41 U/L 11    ?ALT 0 - 44 U/L 8    ? ?Edinburgh Score: ? ?  08/24/2021  ?  5:57 PM  ?EdFlavia Shipperostnatal Depression Scale Screening Tool  ?I have been able to laugh and see the funny side of things. 1  ?I have looked forward with enjoyment to things. 0  ?I have blamed myself unnecessarily when things went wrong.  3  ?I have been anxious or worried for no good reason. 3  ?I have felt scared or panicky for no good reason. 1  ?Things have been getting on top of me. 2  ?I have been so unhappy that I have had difficulty sleeping. 0  ?I have felt sad or miserable. 2  ?I have been so unhappy that I have been crying. 1  ?The thought of harming myself has occurred to me. 0  ?Edinburgh Postnatal Depression Scale Total 13  ? ? ? ?After visit meds:  ?Allergies as of 08/25/2021   ? ?   Reactions  ? Sulfa Antibiotics   ? Nausea/vomiting, weakness  ? ?  ? ?  ?Medication List  ?  ? ?STOP taking these medications   ? ?calcium carbonate 750 MG chewable tablet ?Commonly known as: TUMS EX ?  ? ?  ? ?TAKE these medications   ? ?ferrous sulfate 325 (65 FE) MG tablet ?Take 1 tablet (325 mg total) by mouth every other day. ?  ?folic acid 1 MG tablet ?Commonly known as: FOLVITE ?Take 1 tablet (1 mg total) by mouth daily. ?   ?ibuprofen 600 MG tablet ?Commonly known as: ADVIL ?Take 1 tablet (600 mg total) by mouth every 6 (six) hours. ?  ?oxyCODONE 5 MG immediate release tablet ?Commonly known as: Oxy IR/ROXICODONE ?Take 1 tablet (5 mg total) by mouth every 6 (six) hours as needed for severe pain or breakthrough pain. ?  ?prenatal vitamin w/FE, FA 27-1 MG Tabs tablet ?Take 1 tablet by mouth daily at 12 noon. ?  ? ?  ? ? ? ?Discharge home in stable condition ?Infant Feeding: Breast ?Infant Disposition:home with mother ?Discharge instruction: per After Visit Summary and Postpartum booklet. ?Activity: Advance as tolerated. Pelvic rest for 6 weeks.  ?Diet: routine diet ?Future Appointments: ?Future Appointments  ?Date Time Provider [place]  ?08/29/2021  2:15 PM Genia Del, MD [facility]  ?02/14/2022 10:40 AM Benjamine Mola Resa Miner, MD [facility] None  ? ?Follow up Visit: ? Follow-up Information   ? ? [facility] Follow up in 6 week(s).   ?Specialty: Obstetrics and Gynecology ?Why: postpartum visit, draw RPR for titer at postpartum ?Contact information: ?[address] ?[place] [address] ?[phone] ? ?  ?  ? ?  ?  ? ?  ? ? ? ?Please schedule this patient for a In person postpartum visit in 6 weeks with the following provider: Any provider. ?Additional Postpartum F/U: draw RPR with titer , if still elevated consider retreatment due to failure  , postpartum depression evaluation ?High risk pregnancy complicated by:  syphilis in pregnancy which was treated ?Delivery mode:  Vaginal, Spontaneous  ?Anticipated Birth Control:  Unsure ? ? ?08/25/2021 ?Griffin Basil, MD ? ? ? ?

## 2021-08-25 NOTE — Plan of Care (Signed)
Discharge instructions given with after visit summary, pt receptive.  

## 2021-08-25 NOTE — Progress Notes (Signed)
CSW acknowledged consult for edinburgh score 13. MOB was seen on 08/24/2021 and CSW provided education regarding the baby blues period vs. perinatal mood disorders, discussed treatment and gave resources for mental health follow up if concerns arise.  CSW recommends self-evaluation during the postpartum time period using the New Mom Checklist from Postpartum Progress and encouraged MOB to contact a medical professional if symptoms are noted at any time.   ? ?CSW identifies no further need for intervention and no barriers to discharge at this time. ? ?Celso Sickle, LCSW ?Clinical Social Worker ?[facility] ?Cell#: [phone] ? ? ?
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
## 2021-08-26 LAB — T.PALLIDUM AB, TOTAL: T Pallidum Abs: REACTIVE — AB

## 2021-08-28 ENCOUNTER — Other Ambulatory Visit: Payer: Self-pay

## 2021-08-28 ENCOUNTER — Inpatient Hospital Stay (HOSPITAL_COMMUNITY)
Admission: AD | Admit: 2021-08-28 | Discharge: 2021-08-28 | Disposition: A | Discharge status: Home / Self Care | Payer: Medicaid Other | Attending: Obstetrics and Gynecology | Admitting: Obstetrics and Gynecology

## 2021-08-28 ENCOUNTER — Encounter: Payer: Self-pay | Admitting: Certified Nurse Midwife

## 2021-08-28 DIAGNOSIS — M79661 Pain in right lower leg: Secondary | ICD-10-CM

## 2021-08-28 DIAGNOSIS — M79662 Pain in left lower leg: Secondary | ICD-10-CM | POA: Diagnosis not present

## 2021-08-28 DIAGNOSIS — M79669 Pain in unspecified lower leg: Secondary | ICD-10-CM | POA: Insufficient documentation

## 2021-08-28 DIAGNOSIS — R600 Localized edema: Secondary | ICD-10-CM | POA: Diagnosis not present

## 2021-08-28 DIAGNOSIS — R519 Headache, unspecified: Secondary | ICD-10-CM | POA: Diagnosis not present

## 2021-08-28 DIAGNOSIS — O9089 Other complications of the puerperium, not elsewhere classified: Secondary | ICD-10-CM

## 2021-08-28 DIAGNOSIS — O99355 Diseases of the nervous system complicating the puerperium: Secondary | ICD-10-CM

## 2021-08-28 MED ORDER — BUTALBITAL-APAP-CAFFEINE 50-325-40 MG PO TABS
2.0000 | ORAL_TABLET | Freq: Once | ORAL | Status: AC
  Administered 2021-08-28: 2 via ORAL
  Filled 2021-08-28: qty 2

## 2021-08-28 MED ORDER — ENOXAPARIN SODIUM 80 MG/0.8ML IJ SOSY
80.0000 mg | PREFILLED_SYRINGE | Freq: Once | INTRAMUSCULAR | Status: AC
  Administered 2021-08-28: 80 mg via SUBCUTANEOUS
  Filled 2021-08-28 (×2): qty 0.8

## 2021-08-28 NOTE — MAU Note (Signed)
Angela Sexton is a 31 y.o. here in MAU reporting: vag del 5/5.  Is having a lot of swelling, hurts to walk.  Been having a HA- took something- did not help.  OB wanted her to get checked out. No BP problems with the preg. ? ?Onset of complaint: swellling ?Pain score: 6 ?Vitals:  ? 08/28/21 1824  ?BP: 119/84  ?Pulse: 72  ?Resp: 16  ?Temp: 98.5 ?F (36.9 ?C)  ?SpO2: 100%  ?   ? ?Lab orders placed from triage:  none ?

## 2021-08-28 NOTE — MAU Provider Note (Signed)
?History  ?  ? ?CSN: [id number] ? ?Arrival date and time: 08/28/21 1729 ? ? Event Date/Time  ? First Provider Initiated Contact with Patient 08/28/21 Bosie Helper   ?  ? ?Chief Complaint  ?Patient presents with  ? Headache  ? Leg Swelling  ? ?Ms. Sherrin Sagar is a 31 y.o. G3P2012 at 5 days PP who presents to MAU today with complaint of pedal edema and calf pain.  She states she has been elevating her feet, but has noticed increased swelling. She reports some pain in her feet that she describes as "throbbing and pressure."  She endorses breastfeeding and feels her H2O intake could be better.  She denies SOB or chest pain, but goes on to report intermittent chest pain prior to and throughout the pregnancy that was evaluated.  She also reports pain in her calves and notes that the pain in her left leg is worse than her right. She reports the pain is worse with walking.  She endorses HA that is frontal and describes it as a constant aching.  She reports taking ibuprofen 600mg  without relief and her last dose was one hour prior to arrival.   ? ? ?OB History   ? ? Gravida  ?3  ? Para  ?2  ? Term  ?2  ? Preterm  ?0  ? AB  ?1  ? Living  ?2  ?  ? ? SAB  ?1  ? IAB  ?0  ? Ectopic  ?0  ? Multiple  ?0  ? Live Births  ?2  ?   ?  ?  ? ? ?Past Medical History:  ?Diagnosis Date  ? Eczema   ? ? ?Past Surgical History:  ?Procedure Laterality Date  ? WISDOM TOOTH EXTRACTION    ? ? ?Family History  ?Problem Relation Age of Onset  ? Asthma Mother   ? Eczema Mother   ? Scleroderma Father   ? Melanoma Maternal Grandfather   ? Lung disease Neg Hx   ? ? ?Social History  ? ?Tobacco Use  ? Smoking status: Never  ? Smokeless tobacco: Never  ?Vaping Use  ? Vaping Use: Never used  ?Substance Use Topics  ? Alcohol use: Never  ? Drug use: Never  ? ? ?Allergies:  ?Allergies  ?Allergen Reactions  ? Sulfa Antibiotics   ?  Nausea/vomiting, weakness  ? ? ?No medications prior to admission.  ? ? ?Review of Systems  ?Constitutional:  Negative for chills and  fever.  ?Eyes:  Negative for visual disturbance.  ?Respiratory:  Negative for chest tightness and shortness of breath.   ?Cardiovascular:  Negative for chest pain.  ?Gastrointestinal:  Negative for nausea and vomiting.  ?Musculoskeletal:  Negative for back pain.  ?Neurological:  Positive for headaches. Negative for dizziness and light-headedness.  ?Physical Exam  ? ?Blood pressure 122/86, pulse 72, temperature 98.5 ?F (36.9 ?C), temperature source Oral, resp. rate 18, SpO2 100 %, currently breastfeeding. ? ?Physical Exam ?Vitals reviewed.  ?Constitutional:   ?   General: She is not in acute distress. ?   Appearance: She is well-developed. She is not ill-appearing.  ?HENT:  ?   Head: Normocephalic and atraumatic.  ?Eyes:  ?   Conjunctiva/sclera: Conjunctivae normal.  ?Cardiovascular:  ?   Rate and Rhythm: Normal rate and regular rhythm.  ?   Heart sounds: Normal heart sounds.  ?Pulmonary:  ?   Effort: Pulmonary effort is normal. No respiratory distress.  ?   Breath sounds: Normal breath sounds.  ?  Musculoskeletal:  ?   Cervical back: Normal range of motion.  ?   Right lower leg: Tenderness present.  ?   Left lower leg: Tenderness present.  ?   Right ankle: Swelling present. Tenderness present.  ?   Left ankle: Swelling present. Tenderness present.  ?   Right foot: Swelling and tenderness present. Normal pulse.  ?   Left foot: Swelling and tenderness present. Normal pulse.  ?   Comments: Calf measures 40cm bilaterally. Skin cool to touch. No apparent bruising.   ?Skin: ?   General: Skin is warm and dry.  ?Neurological:  ?   Mental Status: She is alert and oriented to person, place, and time.  ?Psychiatric:     ?   Mood and Affect: Mood normal.     ?   Behavior: Behavior normal.  ? ? ?MAU Course  ?Procedures ?No results found for this or any previous visit (from the past 24 hour(s)). ? ?MDM ?Exam ?Pain medication ?Lovenox ?Doppler Studies ?Assessment and Plan  ?31 year old Female ?5 Days Postpartum s/p SVD in  Water ?Edema of BLE with Pain ?Headache ? ?-POC reviewed. ?-Exam performed and findings discussed. ?-Informed of concern for potential DVT. ?-Educated on DVT and risk factors including recent delivery. ?-Lovenox ordered. ?-Discussed need for follow up for Doppler Studies in AM. ?-Informed that if studies negative plan for increased intake, elevation, and monitoring. ?-Discussed potential usage of HCTZ. ?-Patient verbalizes understanding and without questions. ?-Precautions reviewed. ?-Fioricet ordered for HA and relief noted. Rest encouraged.  ?-Encouraged to call primary office or return to MAU if symptoms worsen or with the onset of new symptoms. ?-Discharged to home in stable condition. ? Gavin Pound, CNM ?08/28/2021 6:39 PM  ? ?

## 2021-08-29 ENCOUNTER — Encounter: Payer: Medicaid Other | Admitting: Family Medicine

## 2021-08-29 ENCOUNTER — Ambulatory Visit (HOSPITAL_COMMUNITY): Admission: RE | Admit: 2021-08-29 | Payer: Medicaid Other | Source: Ambulatory Visit

## 2021-08-29 ENCOUNTER — Telehealth: Payer: Self-pay | Admitting: General Practice

## 2021-08-29 NOTE — Telephone Encounter (Signed)
Called patient regarding need for RPR treatment, no answer- unable to leave message as voicemail was full. Will send mychart message. ?

## 2021-08-31 ENCOUNTER — Telehealth (HOSPITAL_COMMUNITY): Payer: Self-pay

## 2021-08-31 DIAGNOSIS — Z1331 Encounter for screening for depression: Secondary | ICD-10-CM

## 2021-08-31 NOTE — Telephone Encounter (Signed)
"  Doing alright. I've been having some issues with my feet and ankles swelling. I sent a message via mychart to my doctor and they told me to come get my BP checked. So I went to the ER, my BP was normal. I follow up with my OB-GYN on Monday. The swelling has been getting alittle better. They said it is from fluid shifting." RN reviewed preeclampsia and signs and symptoms to watch for. "I have been getting headaches. Usually I get them at the end of the day. I need new glasses to. I don't currently have a headache." RN encouraged patient to reach out to her OB if she starts to have any of the symptoms of preeclampsia or if she has any concerns. RN also encouraged patient to rest, hydrate, and eat well. Patient has no other questions or concerns about her health or healing. ? ?"She's good. Sleeps well and only cries when she is hungry. She is a good baby. She has gained past her birthweight. She sleeps in a bassinet." RN reviewed ABC's of safe sleep with patient. Patient declines any questions or concerns about baby. ? ?EPDS score is 14. Referral placed to integrated behavioral health. RN told patient about Maternal Mental Health Resources ([facility], National Maternal Mental Health Hotline, Postpartum Support International). Also told patient about [facility] support group offerings. Will e-mail these resources to patient as well. ? Heron Nay ?05/13//2023,1513 ?

## 2021-09-01 ENCOUNTER — Inpatient Hospital Stay (HOSPITAL_COMMUNITY): Admission: AD | Admit: 2021-09-01 | Payer: Medicaid Other | Source: Home / Self Care | Admitting: Family Medicine

## 2021-09-01 ENCOUNTER — Inpatient Hospital Stay (HOSPITAL_COMMUNITY): Payer: Medicaid Other

## 2021-09-05 ENCOUNTER — Ambulatory Visit: Payer: Medicaid Other

## 2021-09-05 ENCOUNTER — Ambulatory Visit (HOSPITAL_COMMUNITY): Payer: Medicaid Other

## 2021-09-12 ENCOUNTER — Ambulatory Visit (INDEPENDENT_AMBULATORY_CARE_PROVIDER_SITE_OTHER): Payer: Medicaid Other

## 2021-09-12 VITALS — BP 117/84 | HR 77 | Wt 186.8 lb

## 2021-09-12 DIAGNOSIS — A539 Syphilis, unspecified: Secondary | ICD-10-CM

## 2021-09-12 DIAGNOSIS — Z1331 Encounter for screening for depression: Secondary | ICD-10-CM

## 2021-09-12 MED ORDER — PENICILLIN G BENZATHINE 1200000 UNIT/2ML IM SUSY
2.4000 10*6.[IU] | PREFILLED_SYRINGE | Freq: Once | INTRAMUSCULAR | Status: AC
  Administered 2021-09-12: 2.4 10*6.[IU] via INTRAMUSCULAR

## 2021-09-12 NOTE — Progress Notes (Signed)
Patient is here today for treatment for RPR. First dose of [name]  administered without issue. Patient is aware she is to receive 1 dose per week for 3 weeks. Patient to return in 1 week for next dose. Patient verbalized understanding and denies any other questions.   Patient Angela Sexton Depression screening elevated. Patient accepts offer to see [facility]. Referral placed and appointment scheduled.   Alesia Richards, RN 09/12/21

## 2021-09-13 NOTE — BH Specialist Note (Deleted)
Integrated Behavioral Health via Telemedicine Visit  09/13/2021 Angela Sexton [id number]  Number of Integrated Behavioral Health Clinician visits: No data recorded Session Start time: No data recorded  Session End time: No data recorded Total time in minutes: No data recorded  Referring Provider: *** Patient/Family location: *** [facility] Provider location: *** All persons participating in visit: *** Types of Service: {CHL AMB TYPE OF SERVICE:4062321390}  I connected with Angela Sexton and/or Angela Sexton's {family members:20773} via  Telephone or Video Enabled Telemedicine Application  (Video is Caregility application) and verified that I am speaking with the correct person using two identifiers. Discussed confidentiality: {YES/NO:21197}  I discussed the limitations of telemedicine and the availability of in person appointments.  Discussed there is a possibility of technology failure and discussed alternative modes of communication if that failure occurs.  I discussed that engaging in this telemedicine visit, they consent to the provision of behavioral healthcare and the services will be billed under their insurance.  Patient and/or legal guardian expressed understanding and consented to Telemedicine visit: {YES/NO:21197}  Presenting Concerns: Patient and/or family reports the following symptoms/concerns: *** Duration of problem: ***; Severity of problem: {Mild/Moderate/Severe:20260}  Patient and/or Family's Strengths/Protective Factors: {CHL AMB BH PROTECTIVE FACTORS:[phone]}  Goals Addressed: Patient will:  Reduce symptoms of: {IBH Symptoms:21014056}   Increase knowledge and/or ability of: {IBH Patient Tools:21014057}   Demonstrate ability to: {IBH Goals:21014053}  Progress towards Goals: {CHL AMB BH PROGRESS TOWARDS GOALS:[phone]}  Interventions: Interventions utilized:  {IBH Interventions:21014054} Standardized Assessments completed: {IBH Screening  Tools:21014051}  Patient and/or Family Response: ***  Assessment: Patient currently experiencing ***.   Patient may benefit from ***.  Plan: Follow up with behavioral health clinician on : *** Behavioral recommendations: *** Referral(s): {IBH Referrals:21014055}  I discussed the assessment and treatment plan with the patient and/or parent/guardian. They were provided an opportunity to ask questions and all were answered. They agreed with the plan and demonstrated an understanding of the instructions.   They were advised to call back or seek an in-person evaluation if the symptoms worsen or if the condition fails to improve as anticipated.  Valetta Close Angela Gipson, LCSW

## 2021-09-19 ENCOUNTER — Ambulatory Visit (INDEPENDENT_AMBULATORY_CARE_PROVIDER_SITE_OTHER): Payer: Medicaid Other

## 2021-09-19 VITALS — BP 109/76 | HR 107 | Ht 68.0 in | Wt 185.7 lb

## 2021-09-19 DIAGNOSIS — A539 Syphilis, unspecified: Secondary | ICD-10-CM | POA: Diagnosis not present

## 2021-09-19 MED ORDER — PENICILLIN G BENZATHINE 1200000 UNIT/2ML IM SUSY
2.4000 10*6.[IU] | PREFILLED_SYRINGE | Freq: Once | INTRAMUSCULAR | Status: AC
  Administered 2021-09-19: 2.4 10*6.[IU] via INTRAMUSCULAR

## 2021-09-19 NOTE — Progress Notes (Signed)
Angela Sexton here for  RPR positive treatment with [name] 2.4 Mu Injection.  Injection administered without complication. Patient will return in one week for next injection. Pt to receive 1 dose per week for 3 doses total. Today is number 2 dose. Pt verbalized understanding and agreeable to plan of care. Pt scheduled for last injection on 09/26/2021. Pt aware. Pt has PP visit on 10/08/2021.  [name], RN 09/19/2021  2:50 PM

## 2021-09-24 NOTE — BH Specialist Note (Signed)
Integrated Behavioral Health via Telemedicine Visit  10/02/2021 Angela Sexton [id number]  Number of Integrated Behavioral Health Clinician visits: 1- Initial Visit  Session Start time: 0946   Session End time: 1039  Total time in minutes: 53   Referring Provider: Mariel Aloe, MD Patient/Family location: Home [facility] Provider location: Center for [facility]  All persons participating in visit: Patient Angela Sexton and [facility] Leita Lindbloom   Types of Service: Individual psychotherapy and Video visit  I connected with Shonda Herdt and/or Breelle Ruggerio's  n/a  via  Telephone or Video Enabled Telemedicine Application  (Video is Caregility application) and verified that I am speaking with the correct person using two identifiers. Discussed confidentiality: Yes   I discussed the limitations of telemedicine and the availability of in person appointments.  Discussed there is a possibility of technology failure and discussed alternative modes of communication if that failure occurs.  I discussed that engaging in this telemedicine visit, they consent to the provision of behavioral healthcare and the services will be billed under their insurance.  Patient and/or legal guardian expressed understanding and consented to Telemedicine visit: Yes   Presenting Concerns: Patient and/or family reports the following symptoms/concerns: History of bipolar depression and anxiety; last treated at [facility] with Vraylar one year ago; experiencing passive SI with no plan and no intent; daily anhedonia, fatigue, overeating, poor self-esteem, poor concentration, anxiety, excessive worry, irritability and dread.  Duration of problem: Ongoing with increase postpartum; Severity of problem: severe  Patient and/or Family's Strengths/Protective Factors: Social connections, Concrete supports in place (healthy food, safe environments, etc.), Sense of purpose, and Physical Health  (exercise, healthy diet, medication compliance, etc.)  Goals Addressed: Patient will:  Reduce symptoms of: anxiety, depression, mood instability, and stress   Increase knowledge and/or ability of: healthy habits   Demonstrate ability to: Increase healthy adjustment to current life circumstances and Increase motivation to adhere to plan of care  Progress towards Goals: Ongoing  Interventions: Interventions utilized:  Functional Assessment of ADLs, Psychoeducation and/or Health Education, Supportive Reflection, and Safety Standardized Assessments completed: GAD-7 and PHQ 9  Patient and/or Family Response: Patient agrees with treatment plan.   Assessment: Patient currently experiencing Bipolar affective disorder, mixed, moderately-severe  Patient may benefit from psychoeducation and brief therapeutic interventions regarding coping with symptoms of anxiety, depression, mood instability .  Plan: Follow up with behavioral health clinician on : Two weeks Behavioral recommendations:  -Continue prioritizing healthy self-care (with emphasis on quality sleep) daily -Begin modified Worry Time strategy(no phone reminders); continue daily for two weeks. -Contact previous therapist to re-establish care at [facility] for ongoing therapy and psychiatry -Consider using [facility] as needed (information on After Visit Summary)  Referral(s): Integrated Art gallery manager (In Clinic) and [facility] (LME/Outside Clinic)  I discussed the assessment and treatment plan with the patient and/or parent/guardian. They were provided an opportunity to ask questions and all were answered. They agreed with the plan and demonstrated an understanding of the instructions.   They were advised to call back or seek an in-person evaluation if the symptoms worsen or if the condition fails to improve as anticipated.  Rae Lips, LCSW     10/02/2021   10:04 AM 06/05/2021   10:08 AM  05/08/2021    9:21 AM  Depression screen PHQ 2/9  Decreased Interest 3 1 2   Down, Depressed, Hopeless 1 2 2   PHQ - 2 Score 4 3 4   Altered sleeping  0 0 3  Tired, decreased energy 3 3 2   Change in appetite 3 0 0  Feeling bad or failure about yourself  3 2 3   Trouble concentrating 3 2 2   Moving slowly or fidgety/restless 1 1 1   Suicidal thoughts 1 1 1   PHQ-9 Score 18 12 16       10/02/2021   10:07 AM 06/05/2021   10:09 AM 05/08/2021    9:21 AM  GAD 7 : Generalized Anxiety Score  Nervous, Anxious, on Edge 3 2 2   Control/stop worrying 3 3 3   Worry too much - different things 3 3 3   Trouble relaxing 0 1 1  Restless 0 1 1  Easily annoyed or irritable 3 3 3   Afraid - awful might happen 3 1 1   Total GAD 7 Score 15 14 14

## 2021-09-26 ENCOUNTER — Ambulatory Visit: Payer: Medicaid Other

## 2021-09-30 ENCOUNTER — Ambulatory Visit (INDEPENDENT_AMBULATORY_CARE_PROVIDER_SITE_OTHER): Payer: Medicaid Other | Admitting: *Deleted

## 2021-09-30 VITALS — BP 125/71 | HR 78 | Ht 68.0 in | Wt 192.4 lb

## 2021-09-30 DIAGNOSIS — A539 Syphilis, unspecified: Secondary | ICD-10-CM | POA: Diagnosis not present

## 2021-09-30 MED ORDER — PENICILLIN G BENZATHINE 1200000 UNIT/2ML IM SUSY
1.2000 10*6.[IU] | PREFILLED_SYRINGE | Freq: Once | INTRAMUSCULAR | Status: AC
  Administered 2021-09-30: 1.2 10*6.[IU] via INTRAMUSCULAR

## 2021-09-30 NOTE — Progress Notes (Signed)
Agree with nurses's documentation of this patient's clinic encounter.  Athen Riel L, MD  

## 2021-09-30 NOTE — Progress Notes (Signed)
Here today for 3rd weekly injections for treatment of syphilis. Discussed with Dr. Alysia Penna and Pharmacist Argentina Ponder if patient needs 3rd injection and was approved and ordered as indicated for treatment . Plan also made for titers in 6 months.  Also discussed with patient very important her partner get treated also and no intimate contact or intercourse until both treated and wait several weeks after last person treated  she states he still needs to finish treatment and voices understanding.  Penicillin G 1.2 million units given in each hip. Patient tolerated without complaint. Lab appointment made for titers . Nancy Fetter

## 2021-10-02 ENCOUNTER — Ambulatory Visit (INDEPENDENT_AMBULATORY_CARE_PROVIDER_SITE_OTHER): Payer: Medicaid Other | Admitting: Clinical

## 2021-10-02 DIAGNOSIS — F3160 Bipolar disorder, current episode mixed, unspecified: Secondary | ICD-10-CM

## 2021-10-02 NOTE — Patient Instructions (Signed)
[facility] [address] [place], [place] [address] [phone] (main office) [phone] (Jere Bostrom's office)  [facility]  [address], [place], [place] [address] [phone] or [phone] WALK-IN URGENT CARE 24/7 FOR ANYONE [address], [place], [place]  [phone] Fax: [phone] [email or web address] *Interpreters available *Accepts all insurance and uninsured for Urgent Care needs *Accepts Medicaid and uninsured for outpatient treatment     ONLY FOR [place] Residents  New patient assessment and therapy walk-ins Mondays and Wednesdays 8am-11am First and second Fridays 1pm-5pm  New patient psychiatry and medication management walk-ins:  Mondays, Wednesdays, Thursdays, Fridays 8am -11am NO PSYCHIATRY WALK-INS on TUESDAYS   

## 2021-10-03 NOTE — BH Specialist Note (Signed)
Pt did not arrive to video visit and did not answer the phone; Unable to leave voice message as mailbox is full; left MyChart message for patient.   

## 2021-10-08 ENCOUNTER — Ambulatory Visit (INDEPENDENT_AMBULATORY_CARE_PROVIDER_SITE_OTHER): Payer: Medicaid Other | Admitting: Advanced Practice Midwife

## 2021-10-08 DIAGNOSIS — O98113 Syphilis complicating pregnancy, third trimester: Secondary | ICD-10-CM

## 2021-10-08 MED ORDER — FLUCONAZOLE 150 MG PO TABS: 150.0000 mg | ORAL_TABLET | Freq: Once | ORAL | 0 refills | Status: AC

## 2021-10-08 NOTE — Progress Notes (Addendum)
    Post Partum Visit Note  Angela Sexton is a 31 y.o. [id number] female who presents for a postpartum visit. She is 6 weeks postpartum following a normal spontaneous vaginal delivery.  I have fully reviewed the prenatal and intrapartum course. The delivery was at [redacted]w[redacted]d gestational weeks.  Anesthesia: none. Postpartum course has been unremarkable. Baby is doing well. Baby is feeding by breast. Bleeding no bleeding. Bowel function is normal. Bladder function is normal. Patient is not sexually active. Contraception method is none. Postpartum depression screening: positive.   The pregnancy intention screening data noted above was reviewed. Potential methods of contraception were discussed. The patient declines contraception today    Health Maintenance Due  Topic Date Due   COVID-19 Vaccine (5 - Pfizer series) 09/23/2019    The following portions of the patient's history were reviewed and updated as appropriate: allergies, current medications, past family history, past medical history, past social history, past surgical history, and problem list.  Review of Systems A comprehensive review of systems was negative.  Objective:  LMP  (LMP Unknown)    General:  alert, cooperative, appears stated age, and no distress   Breasts:  Declined by patient  Lungs: Normal work of breathing  Heart:  normal apical impulse  Abdomen: soft, non-tender; bowel sounds normal; no masses,  no organomegaly   GU exam:  not indicated       Assessment:    Normal postpartum exam RPR in third trimester. S/p weekly treatment x 3. Appt for repeat titers December 2023  Plan:   Essential components of care per ACOG recommendations:  1.  Mood and well being: Patient with negative depression screening today. Reviewed local resources for support.  - Patient tobacco use? No.   - hx of drug use? No.    2. Infant care and feeding:  -Patient currently breastmilk feeding? Yes. Reviewed importance of draining breast  regularly to support lactation.  -Social determinants of health (SDOH) reviewed in EPIC. No concerns  3. Sexuality, contraception and birth spacing - Patient does not want a pregnancy in the next year.   - Reviewed reproductive life planning. Reviewed contraceptive methods based on pt preferences and effectiveness.  Patient desired No Method - No Contraceptive Precautions today.   - Discussed birth spacing of 18 months  4. Sleep and fatigue -Encouraged family/partner/community support of 4 hrs of uninterrupted sleep to help with mood and fatigue  5. Physical Recovery  - Discussed patients delivery and complications. She describes her labor as good. - Patient had a Vaginal, no problems at delivery. Intact perineum - Patient has urinary incontinence? No. - Patient is safe to resume physical and sexual activity  6.  Health Maintenance - HM due items addressed Yes - Last pap smear : 03/19/2021 Pap smear not done at today's visit.  -Breast Cancer screening indicated? No.   7. Chronic Disease/Pregnancy Condition follow up: RPR titers, appointment in Dec per Dr. Paulo Fruit, MSA, MSN, CNM Certified Nurse Midwife, [facility] for [facility], [facility]

## 2021-10-16 ENCOUNTER — Ambulatory Visit: Payer: Medicaid Other | Admitting: Clinical

## 2021-10-16 DIAGNOSIS — Z91199 Patient's noncompliance with other medical treatment and regimen due to unspecified reason: Secondary | ICD-10-CM

## 2021-12-25 ENCOUNTER — Emergency Department (HOSPITAL_BASED_OUTPATIENT_CLINIC_OR_DEPARTMENT_OTHER): Payer: Medicaid Other | Admitting: Radiology

## 2021-12-25 ENCOUNTER — Other Ambulatory Visit: Payer: Self-pay

## 2021-12-25 ENCOUNTER — Encounter (HOSPITAL_BASED_OUTPATIENT_CLINIC_OR_DEPARTMENT_OTHER): Payer: Self-pay | Admitting: Emergency Medicine

## 2021-12-25 ENCOUNTER — Emergency Department (HOSPITAL_BASED_OUTPATIENT_CLINIC_OR_DEPARTMENT_OTHER)
Admission: EM | Admit: 2021-12-25 | Discharge: 2021-12-25 | Disposition: A | Discharge status: Home / Self Care | Payer: Medicaid Other | Attending: Emergency Medicine | Admitting: Emergency Medicine

## 2021-12-25 DIAGNOSIS — M7989 Other specified soft tissue disorders: Secondary | ICD-10-CM | POA: Insufficient documentation

## 2021-12-25 DIAGNOSIS — W502XXA Accidental twist by another person, initial encounter: Secondary | ICD-10-CM | POA: Insufficient documentation

## 2021-12-25 DIAGNOSIS — S63636A Sprain of interphalangeal joint of right little finger, initial encounter: Secondary | ICD-10-CM | POA: Diagnosis not present

## 2021-12-25 DIAGNOSIS — S6991XA Unspecified injury of right wrist, hand and finger(s), initial encounter: Secondary | ICD-10-CM | POA: Diagnosis present

## 2021-12-25 MED ORDER — ACETAMINOPHEN 325 MG PO TABS
650.0000 mg | ORAL_TABLET | Freq: Once | ORAL | Status: AC
  Administered 2021-12-25: 650 mg via ORAL
  Filled 2021-12-25: qty 2

## 2021-12-25 MED ORDER — NAPROXEN 250 MG PO TABS
500.0000 mg | ORAL_TABLET | Freq: Once | ORAL | Status: AC
Start: 2021-12-25 — End: 2021-12-25
  Administered 2021-12-25: 500 mg via ORAL
  Filled 2021-12-25: qty 2

## 2021-12-25 MED ORDER — IBUPROFEN 600 MG PO TABS: 600.0000 mg | ORAL_TABLET | Freq: Four times a day (QID) | ORAL | 0 refills | Status: DC | PRN

## 2021-12-25 NOTE — Discharge Instructions (Signed)
X-ray of your finger is reassuring.  Take ibuprofen for pain and swelling.  Read the instructions on finger sprain and follow-up with your primary care doctor in 7 to 14 days if the pain continues.
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
## 2021-12-25 NOTE — ED Triage Notes (Signed)
Pt arrives to ED with c/o right sided little finger injury that occurred last night.

## 2021-12-25 NOTE — ED Notes (Signed)
Patient verbalizes understanding of discharge instructions. Opportunity for questioning and answers were provided. Patient discharged from ED.  °
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
## 2022-02-14 ENCOUNTER — Ambulatory Visit: Payer: Medicaid Other | Attending: Internal Medicine | Admitting: Internal Medicine

## 2022-02-14 DIAGNOSIS — O98112 Syphilis complicating pregnancy, second trimester: Secondary | ICD-10-CM

## 2022-02-14 DIAGNOSIS — R768 Other specified abnormal immunological findings in serum: Secondary | ICD-10-CM

## 2022-02-14 DIAGNOSIS — I7300 Raynaud's syndrome without gangrene: Secondary | ICD-10-CM

## 2022-02-14 DIAGNOSIS — R21 Rash and other nonspecific skin eruption: Secondary | ICD-10-CM

## 2022-02-14 DIAGNOSIS — R590 Localized enlarged lymph nodes: Secondary | ICD-10-CM

## 2022-03-31 ENCOUNTER — Other Ambulatory Visit: Payer: Self-pay

## 2022-03-31 DIAGNOSIS — A539 Syphilis, unspecified: Secondary | ICD-10-CM

## 2022-04-01 ENCOUNTER — Other Ambulatory Visit: Payer: Medicaid Other

## 2022-04-23 ENCOUNTER — Other Ambulatory Visit: Payer: Medicaid Other

## 2022-04-23 DIAGNOSIS — A539 Syphilis, unspecified: Secondary | ICD-10-CM

## 2022-04-24 LAB — RPR, QUANT+TP ABS (REFLEX)
Rapid Plasma Reagin, Quant: 1:8 {titer} — ABNORMAL HIGH
T Pallidum Abs: REACTIVE — AB

## 2022-04-24 LAB — RPR: RPR Ser Ql: REACTIVE — AB

## 2022-05-30 ENCOUNTER — Encounter: Payer: Self-pay | Admitting: Advanced Practice Midwife

## 2022-06-04 ENCOUNTER — Encounter: Payer: Self-pay | Admitting: Obstetrics and Gynecology

## 2022-06-04 ENCOUNTER — Ambulatory Visit (INDEPENDENT_AMBULATORY_CARE_PROVIDER_SITE_OTHER): Payer: Medicaid Other | Admitting: Obstetrics and Gynecology

## 2022-06-04 ENCOUNTER — Telehealth: Payer: Self-pay | Admitting: Family Medicine

## 2022-06-04 ENCOUNTER — Other Ambulatory Visit (HOSPITAL_COMMUNITY)
Admission: RE | Admit: 2022-06-04 | Discharge: 2022-06-04 | Disposition: A | Discharge status: Home / Self Care | Payer: Medicaid Other | Source: Ambulatory Visit | Attending: Obstetrics and Gynecology | Admitting: Obstetrics and Gynecology

## 2022-06-04 VITALS — BP 115/75 | HR 128

## 2022-06-04 DIAGNOSIS — N898 Other specified noninflammatory disorders of vagina: Secondary | ICD-10-CM | POA: Diagnosis present

## 2022-06-04 DIAGNOSIS — A539 Syphilis, unspecified: Secondary | ICD-10-CM | POA: Diagnosis not present

## 2022-06-04 NOTE — Progress Notes (Signed)
    GYNECOLOGY VISIT  Patient name: Angela Sexton MRN [id number]  Date of birth: [birth year] Chief Complaint:   Vaginal Discharge  History:  Angela Sexton is a 32 y.o. [id number] being seen today for pelvic pain and discharge that started this week. .  She is not sur eif her fiance got the second round of treatment. Last had sex with fiance about a week or so ago. Feels cold in room but no fever or chills. No urinary changes. No vaginal itching.   Past Medical History:  Diagnosis Date   Eczema    Positive ANA (antinuclear antibody) 05/18/2021   Concern for Lupus   Elevated sed rate  Repeat ANA neg  dsDNA neg  SSA/SSB- Negative   Syphilis 05/09/2021   05-08-21  Positive RPR with titer of 1:32--> treated with bicillin x3   [x]  6 month titer: 1:32 (inappropriate drop) Retreated [name] x 3  [x]  12 month titer: 1:8 (appropriate drop)  [ ]  18 month titer (12 months after second treatment)          Past Surgical History:  Procedure Laterality Date   WISDOM TOOTH EXTRACTION      The following portions of the patient's history were reviewed and updated as appropriate: allergies, current medications, past family history, past medical history, past social history, past surgical history and problem list.   Health Maintenance:   Last pap 11/2017. NILM (PP note from 2023 states NILM pap 02/2021- able to see was done,  unsure where final result is located) Last mammogram: n/a   Review of Systems:  Pertinent items are noted in HPI. Comprehensive review of systems was otherwise negative.   Objective:  Physical Exam BP 115/75   Pulse (!) 128   Breastfeeding Yes    Physical Exam Vitals and nursing note reviewed. Exam conducted with a chaperone present.  Constitutional:      Appearance: Normal appearance.  HENT:     Head: Normocephalic and atraumatic.  Pulmonary:     Effort: Pulmonary effort is normal.     Breath sounds: Normal breath sounds.  Genitourinary:    General: Normal vulva.      Exam position: Lithotomy position.     Vagina: Normal.     Cervix: Cervical motion tenderness and discharge present.     Comments: Small cervix, non-erythematous Nontender levator ani Skin:    General: Skin is warm and dry.  Neurological:     General: No focal deficit present.     Mental Status: She is alert.  Psychiatric:        Mood and Affect: Mood normal.        Behavior: Behavior normal.        Thought Content: Thought content normal.        Judgment: Judgment normal.       Assessment & Plan:   1. Vaginal discharge Suspect PID/pelvic infection, unclear if re-exposure to syphillis or other infection. Vaginitis swab collected and serum STI.  - Cervicovaginal ancillary only( [place]) - RPR - Genital Mycoplasmas NAA, Swab - Hepatitis B surface antigen - Hepatitis C antibody - HIV Antibody (routine testing w rflx)  2. Syphilis Repeat titer collected given uncertainty if partner has been adequately treated for syphillis and possible re-exposure.    Routine preventative health maintenance measures emphasized.  Darliss Cheney, MD [facility] [place]

## 2022-06-04 NOTE — Telephone Encounter (Signed)
Patient called and said she need to be seen today tdue to her having a lot of issues and she can barely make it through work, she couldn't tell me what due to her being at work and she don't want nobody to hear her.

## 2022-06-04 NOTE — Telephone Encounter (Addendum)
Called pt and discussed her concerns.  She stated that she has been having yellow vaginal discharge and increased pelvic pain x1 week. She has been taking ibuprofen for the pain and it is not helping. The pain is disrupting her work and ADL. She states she was told to contact us if her symptoms became worse and requests to be seen in office today. Pt was advised that we can work her in this afternoon. Pt voiced understanding.

## 2022-06-05 ENCOUNTER — Other Ambulatory Visit: Payer: Medicaid Other

## 2022-06-05 ENCOUNTER — Encounter: Payer: Self-pay | Admitting: Family Medicine

## 2022-06-05 ENCOUNTER — Ambulatory Visit (INDEPENDENT_AMBULATORY_CARE_PROVIDER_SITE_OTHER): Payer: Medicaid Other

## 2022-06-05 VITALS — BP 122/72 | HR 98 | Temp 98.5°F

## 2022-06-05 DIAGNOSIS — R39198 Other difficulties with micturition: Secondary | ICD-10-CM

## 2022-06-05 DIAGNOSIS — B3731 Acute candidiasis of vulva and vagina: Secondary | ICD-10-CM

## 2022-06-05 DIAGNOSIS — N3001 Acute cystitis with hematuria: Secondary | ICD-10-CM | POA: Diagnosis not present

## 2022-06-05 LAB — POCT URINALYSIS DIP (DEVICE)
Glucose, UA: NEGATIVE mg/dL
Ketones, ur: 15 mg/dL — AB
Leukocytes,Ua: NEGATIVE
Nitrite: POSITIVE — AB
Protein, ur: 100 mg/dL — AB
Specific Gravity, Urine: >=1.03 (ref 1.005–1.030)
Urobilinogen, UA: 1 mg/dL (ref 0.0–1.0)
pH: 6 (ref 5.0–8.0)

## 2022-06-05 MED ORDER — CEFADROXIL 500 MG PO CAPS: 500.0000 mg | ORAL_CAPSULE | Freq: Two times a day (BID) | ORAL | 0 refills | Status: DC

## 2022-06-05 MED ORDER — FLUCONAZOLE 150 MG PO TABS: 150.0000 mg | ORAL_TABLET | Freq: Every day | ORAL | 1 refills | Status: DC

## 2022-06-05 MED ORDER — OXYCODONE-ACETAMINOPHEN 5-325 MG PO TABS: 1.0000 | ORAL_TABLET | ORAL | 0 refills | Status: DC | PRN

## 2022-06-05 MED ORDER — PHENAZOPYRIDINE HCL 200 MG PO TABS: 200.0000 mg | ORAL_TABLET | Freq: Three times a day (TID) | ORAL | 0 refills | Status: DC | PRN

## 2022-06-05 NOTE — Progress Notes (Signed)
Here today for lab work following provider visit to evaluate pelvic pain yesterday. Per chart review Ajewole, MD has suspicion pain related to PID. Pt requests to speak with provider while in office. Provider visit not available, pt agreeable to nurse visit.   Reports severe abdominal cramps rated 7/10 currently. States pain has felt like uterine contractions for past week. Has taken ibuprofen multiple times with no improvement. Vaginal swabs collected yesterday to check for infection. STI blood screening labs drawn today. Urine not evaluated at visit yesterday. Reports difficulty starting stream of urine. UA today very suspicious for UTI. All vitals WNL. Reviewed with Gilford Rile CNM who comes to bedside for brief visit.   Provider recommends treatment for UTI and urine to be sent for culture. Will follow lab results and communicate with patient.   Apolonio Schneiders RN  06/05/22

## 2022-06-06 LAB — CERVICOVAGINAL ANCILLARY ONLY
Bacterial Vaginitis (gardnerella): POSITIVE — AB
Candida Glabrata: NEGATIVE
Candida Vaginitis: NEGATIVE
Chlamydia: NEGATIVE
Comment: NEGATIVE
Comment: NEGATIVE
Comment: NEGATIVE
Comment: NEGATIVE
Comment: NEGATIVE
Comment: NORMAL
Neisseria Gonorrhea: POSITIVE — AB
Trichomonas: NEGATIVE

## 2022-06-08 LAB — HEPATITIS B SURFACE ANTIGEN: Hepatitis B Surface Ag: NEGATIVE

## 2022-06-08 LAB — URINE CULTURE

## 2022-06-08 LAB — RPR: RPR Ser Ql: REACTIVE — AB

## 2022-06-08 LAB — HIV ANTIBODY (ROUTINE TESTING W REFLEX): HIV Screen 4th Generation wRfx: NONREACTIVE

## 2022-06-08 LAB — RPR, QUANT+TP ABS (REFLEX)
Rapid Plasma Reagin, Quant: 1:8 {titer} — ABNORMAL HIGH
T Pallidum Abs: REACTIVE — AB

## 2022-06-08 LAB — HEPATITIS C ANTIBODY: Hep C Virus Ab: NONREACTIVE

## 2022-06-10 ENCOUNTER — Other Ambulatory Visit: Payer: Self-pay | Admitting: Obstetrics and Gynecology

## 2022-06-10 DIAGNOSIS — B9689 Other specified bacterial agents as the cause of diseases classified elsewhere: Secondary | ICD-10-CM

## 2022-06-10 MED ORDER — METRONIDAZOLE 0.75 % VA GEL: 1.0000 | Freq: Every day | VAGINAL | 1 refills | Status: DC

## 2022-06-18 ENCOUNTER — Ambulatory Visit: Payer: Medicaid Other

## 2022-06-18 ENCOUNTER — Ambulatory Visit (INDEPENDENT_AMBULATORY_CARE_PROVIDER_SITE_OTHER): Payer: Medicaid Other

## 2022-06-18 DIAGNOSIS — A549 Gonococcal infection, unspecified: Secondary | ICD-10-CM | POA: Diagnosis not present

## 2022-06-18 MED ORDER — CEFTRIAXONE SODIUM 500 MG IJ SOLR
500.0000 mg | Freq: Once | INTRAMUSCULAR | Status: AC
  Administered 2022-06-18: 500 mg via INTRAMUSCULAR

## 2022-06-18 NOTE — Progress Notes (Signed)
Angela Sexton here for Rocephin  Injection.  Injection administered without complication. Patient will return in 3 months for test of cure.   Martina Sinner, RN 06/18/2022  4:35 PM

## 2022-07-26 ENCOUNTER — Ambulatory Visit (HOSPITAL_COMMUNITY)
Admission: RE | Admit: 2022-07-26 | Discharge: 2022-07-26 | Disposition: A | Discharge status: Home / Self Care | Payer: Medicaid Other | Source: Ambulatory Visit | Attending: Family Medicine | Admitting: Family Medicine

## 2022-07-26 ENCOUNTER — Other Ambulatory Visit: Payer: Self-pay

## 2022-07-26 ENCOUNTER — Encounter (HOSPITAL_COMMUNITY): Payer: Self-pay

## 2022-07-26 VITALS — BP 113/76 | HR 75 | Temp 98.8°F | Resp 18

## 2022-07-26 DIAGNOSIS — R1013 Epigastric pain: Secondary | ICD-10-CM | POA: Diagnosis not present

## 2022-07-26 LAB — POCT URINALYSIS DIPSTICK, ED / UC
Bilirubin Urine: NEGATIVE
Glucose, UA: NEGATIVE mg/dL
Ketones, ur: NEGATIVE mg/dL
Leukocytes,Ua: NEGATIVE
Nitrite: NEGATIVE
Protein, ur: NEGATIVE mg/dL
Specific Gravity, Urine: 1.025 (ref 1.005–1.030)
Urobilinogen, UA: 0.2 mg/dL (ref 0.0–1.0)
pH: 6 (ref 5.0–8.0)

## 2022-07-26 LAB — COMPREHENSIVE METABOLIC PANEL
ALT: 12 U/L (ref 0–44)
AST: 17 U/L (ref 15–41)
Albumin: 3.9 g/dL (ref 3.5–5.0)
Alkaline Phosphatase: 53 U/L (ref 38–126)
Anion gap: 7 (ref 5–15)
BUN: 5 mg/dL — ABNORMAL LOW (ref 6–20)
CO2: 26 mmol/L (ref 22–32)
Calcium: 8.7 mg/dL — ABNORMAL LOW (ref 8.9–10.3)
Chloride: 104 mmol/L (ref 98–111)
Creatinine, Ser: 0.59 mg/dL (ref 0.44–1.00)
GFR, Estimated: >60 mL/min (ref >60)
Glucose, Bld: 81 mg/dL (ref 70–99)
Potassium: 3.8 mmol/L (ref 3.5–5.1)
Sodium: 137 mmol/L (ref 135–145)
Total Bilirubin: 0.8 mg/dL (ref 0.3–1.2)
Total Protein: 6.9 g/dL (ref 6.5–8.1)

## 2022-07-26 LAB — CBC WITH DIFFERENTIAL/PLATELET
Abs Immature Granulocytes: 0.01 10*3/uL (ref 0.00–0.07)
Basophils Absolute: 0.1 10*3/uL (ref 0.0–0.1)
Basophils Relative: 1 %
Eosinophils Absolute: 0.2 10*3/uL (ref 0.0–0.5)
Eosinophils Relative: 2 %
HCT: 35.2 % — ABNORMAL LOW (ref 36.0–46.0)
Hemoglobin: 11.4 g/dL — ABNORMAL LOW (ref 12.0–15.0)
Immature Granulocytes: 0 %
Lymphocytes Relative: 41 %
Lymphs Abs: 3.3 10*3/uL (ref 0.7–4.0)
MCH: 29.5 pg (ref 26.0–34.0)
MCHC: 32.4 g/dL (ref 30.0–36.0)
MCV: 91 fL (ref 80.0–100.0)
Monocytes Absolute: 0.5 10*3/uL (ref 0.1–1.0)
Monocytes Relative: 6 %
Neutro Abs: 3.9 10*3/uL (ref 1.7–7.7)
Neutrophils Relative %: 50 %
Platelets: 283 10*3/uL (ref 150–400)
RBC: 3.87 MIL/uL (ref 3.87–5.11)
RDW: 13.5 % (ref 11.5–15.5)
WBC: 7.9 10*3/uL (ref 4.0–10.5)
nRBC: 0 % (ref 0.0–0.2)

## 2022-07-26 LAB — LIPASE, BLOOD: Lipase: 41 U/L (ref 11–51)

## 2022-07-26 LAB — POC URINE PREG, ED: Preg Test, Ur: NEGATIVE

## 2022-07-26 MED ORDER — FAMOTIDINE 20 MG PO TABS: 20.0000 mg | ORAL_TABLET | Freq: Two times a day (BID) | ORAL | 0 refills | Status: DC

## 2022-07-26 NOTE — ED Triage Notes (Signed)
Complains of severe abdominal pain.  Patient reports symptoms have no improved since being seen last month by doctor.  Symptoms have been present for 2 months.  Reports a uti was discovered and treated for this.  Pain continues, is center abdomen, above and below navel  Movement seems to make pain worse.  No pain with urination.  Patient thinks she may have "a little extra clear discharge".  Last BM was this morning.  And normal per patient  Has not had any medicine today for symptoms

## 2022-07-26 NOTE — ED Notes (Signed)
Did provide a pcp with appt .  Information handed to patient

## 2022-07-26 NOTE — Discharge Instructions (Addendum)

## 2022-07-28 ENCOUNTER — Telehealth (HOSPITAL_COMMUNITY): Payer: Self-pay | Admitting: Emergency Medicine

## 2022-07-28 LAB — CERVICOVAGINAL ANCILLARY ONLY
Bacterial Vaginitis (gardnerella): POSITIVE — AB
Candida Glabrata: NEGATIVE
Candida Vaginitis: NEGATIVE
Chlamydia: NEGATIVE
Comment: NEGATIVE
Comment: NEGATIVE
Comment: NEGATIVE
Comment: NEGATIVE
Comment: NEGATIVE
Comment: NORMAL
Neisseria Gonorrhea: NEGATIVE
Trichomonas: NEGATIVE

## 2022-07-28 MED ORDER — METRONIDAZOLE 0.75 % VA GEL: 1.0000 | Freq: Every day | VAGINAL | 0 refills | Status: AC

## 2022-07-28 NOTE — ED Provider Notes (Signed)
[facility]   [id number] 07/26/22 Arrival Time: 1402  ASSESSMENT & PLAN:  1. Epigastric pain     Benign abdominal exam. No indications for urgent abdominal/pelvic imaging at this time. Discussed. This is Day #1. She is comfortable with home observation. Agrees to ED eval should she worsen. Ques GERD component. Trial of : Meds ordered this encounter  Medications   famotidine (PEPCID) 20 MG tablet    Sig: Take 1 tablet (20 mg total) by mouth 2 (two) times daily.    Dispense:  30 tablet    Refill:  0   No significant laboratory abnormalities to explain her pain.  Results for orders placed or performed during the hospital encounter of 07/26/22  CBC with Differential/Platelet  Result Value Ref Range   WBC 7.9 4.0 - 10.5 K/uL   RBC 3.87 3.87 - 5.11 MIL/uL   Hemoglobin 11.4 (L) 12.0 - 15.0 g/dL   HCT 40.935.2 (L) 81.136.0 - 91.446.0 %   MCV 91.0 80.0 - 100.0 fL   MCH 29.5 26.0 - 34.0 pg   MCHC 32.4 30.0 - 36.0 g/dL   RDW 78.213.5 95.611.5 - 21.315.5 %   Platelets 283 150 - 400 K/uL   nRBC 0.0 0.0 - 0.2 %   Neutrophils Relative % 50 %   Neutro Abs 3.9 1.7 - 7.7 K/uL   Lymphocytes Relative 41 %   Lymphs Abs 3.3 0.7 - 4.0 K/uL   Monocytes Relative 6 %   Monocytes Absolute 0.5 0.1 - 1.0 K/uL   Eosinophils Relative 2 %   Eosinophils Absolute 0.2 0.0 - 0.5 K/uL   Basophils Relative 1 %   Basophils Absolute 0.1 0.0 - 0.1 K/uL   Immature Granulocytes 0 %   Abs Immature Granulocytes 0.01 0.00 - 0.07 K/uL  Lipase, blood  Result Value Ref Range   Lipase 41 11 - 51 U/L  Comprehensive metabolic panel  Result Value Ref Range   Sodium 137 135 - 145 mmol/L   Potassium 3.8 3.5 - 5.1 mmol/L   Chloride 104 98 - 111 mmol/L   CO2 26 22 - 32 mmol/L   Glucose, Bld 81 70 - 99 mg/dL   BUN 5 (L) 6 - 20 mg/dL   Creatinine, Ser 0.860.59 0.44 - 1.00 mg/dL   Calcium 8.7 (L) 8.9 - 10.3 mg/dL   Total Protein 6.9 6.5 - 8.1 g/dL   Albumin 3.9 3.5 - 5.0 g/dL   AST 17 15 - 41 U/L   ALT 12 0 - 44 U/L   Alkaline  Phosphatase 53 38 - 126 U/L   Total Bilirubin 0.8 0.3 - 1.2 mg/dL   GFR, Estimated >57>60 >84>60 mL/min   Anion gap 7 5 - 15  POC urine pregnancy  Result Value Ref Range   Preg Test, Ur NEGATIVE NEGATIVE  POC Urinalysis dipstick  Result Value Ref Range   Glucose, UA NEGATIVE NEGATIVE mg/dL   Bilirubin Urine NEGATIVE NEGATIVE   Ketones, ur NEGATIVE NEGATIVE mg/dL   Specific Gravity, Urine 1.025 1.005 - 1.030   Hgb urine dipstick TRACE (A) NEGATIVE   pH 6.0 5.0 - 8.0   Protein, ur NEGATIVE NEGATIVE mg/dL   Urobilinogen, UA 0.2 0.0 - 1.0 mg/dL   Nitrite NEGATIVE NEGATIVE   Leukocytes,Ua NEGATIVE NEGATIVE      Discharge Instructions      You have been seen today for abdominal pain. Your evaluation was not suggestive of any emergent condition requiring medical intervention at this time. However, some abdominal problems  make take more time to appear. Therefore, it is very important for you to pay attention to any new symptoms or worsening of your current condition.  Please return here or to the Emergency Department immediately should you begin to feel worse in any way or have any of the following symptoms: increasing or different abdominal pain, persistent vomiting, inability to drink fluids, fevers, or shaking chills.   You have had laboratory tests sent today. We will call you with any significant abnormalities or if there is need to begin or change treatment or pursue further follow up.  You may also review your test results online through MyChart. If you do not have a MyChart account, instructions to sign up should be on your discharge paperwork.     Follow-up Information     Schedule an appointment as soon as possible for a visit  with Trisha Mangle, FNP.   Specialty: Family Medicine Contact information: [address] [place] [address] [phone]         [facility] at [facility].   Specialty: Emergency Medicine Why: If symptoms  worsen in any way. Contact information: [address] [id number] mc [place] [address] [phone]                Reviewed expectations re: course of current medical issues. Questions answered. Outlined signs and symptoms indicating need for more acute intervention. Patient verbalized understanding. After Visit Summary given.   SUBJECTIVE: History from: patient. Angela Sexton is a 31 y.o. female who presents with complaint of severe abdominal pain.  Patient reports symptoms have no improved since being seen last month by doctor.  Symptoms have been present for 2 months.  Off/on. Current symptoms started today. Reports a uti was discovered and treated for this.  Pain continues, is center abdomen, above and below navel  Movement seems to make pain worse.  No pain with urination.  Patient thinks she may have "a little extra clear discharge".  Last BM was this morning.  And normal per patient  No tx PTA.   Past Surgical History:  Procedure Laterality Date   WISDOM TOOTH EXTRACTION       OBJECTIVE:  Vitals:   07/26/22 1420  BP: 113/76  Pulse: 75  Resp: 18  Temp: 98.8 F (37.1 C)  TempSrc: Oral  SpO2: 98%    General appearance: alert, oriented, no acute distress HEENT: [place]; AT; oropharynx moist Lungs: unlabored respirations Abdomen: soft; without distention; mild  and poorly localized tenderness to palpation over egigastric distribution ; normal bowel sounds; without masses or organomegaly; without guarding or rebound tenderness Back: without reported CVA tenderness; FROM at waist Extremities: without LE edema; symmetrical; without gross deformities Skin: warm and dry Neurologic: normal gait Psychological: alert and cooperative; normal mood and affect  Labs: Results for orders placed or performed during the hospital encounter of 07/26/22  CBC with Differential/Platelet  Result Value Ref Range   WBC 7.9 4.0 - 10.5 K/uL   RBC 3.87 3.87 -  5.11 MIL/uL   Hemoglobin 11.4 (L) 12.0 - 15.0 g/dL   HCT 41.5 (L) 83.0 - 94.0 %   MCV 91.0 80.0 - 100.0 fL   MCH 29.5 26.0 - 34.0 pg   MCHC 32.4 30.0 - 36.0 g/dL   RDW 76.8 08.8 - 11.0 %   Platelets 283 150 - 400 K/uL   nRBC 0.0 0.0 - 0.2 %   Neutrophils Relative % 50 %   Neutro Abs  3.9 1.7 - 7.7 K/uL   Lymphocytes Relative 41 %   Lymphs Abs 3.3 0.7 - 4.0 K/uL   Monocytes Relative 6 %   Monocytes Absolute 0.5 0.1 - 1.0 K/uL   Eosinophils Relative 2 %   Eosinophils Absolute 0.2 0.0 - 0.5 K/uL   Basophils Relative 1 %   Basophils Absolute 0.1 0.0 - 0.1 K/uL   Immature Granulocytes 0 %   Abs Immature Granulocytes 0.01 0.00 - 0.07 K/uL  Lipase, blood  Result Value Ref Range   Lipase 41 11 - 51 U/L  Comprehensive metabolic panel  Result Value Ref Range   Sodium 137 135 - 145 mmol/L   Potassium 3.8 3.5 - 5.1 mmol/L   Chloride 104 98 - 111 mmol/L   CO2 26 22 - 32 mmol/L   Glucose, Bld 81 70 - 99 mg/dL   BUN 5 (L) 6 - 20 mg/dL   Creatinine, Ser 4.09 0.44 - 1.00 mg/dL   Calcium 8.7 (L) 8.9 - 10.3 mg/dL   Total Protein 6.9 6.5 - 8.1 g/dL   Albumin 3.9 3.5 - 5.0 g/dL   AST 17 15 - 41 U/L   ALT 12 0 - 44 U/L   Alkaline Phosphatase 53 38 - 126 U/L   Total Bilirubin 0.8 0.3 - 1.2 mg/dL   GFR, Estimated >81 >19 mL/min   Anion gap 7 5 - 15  POC urine pregnancy  Result Value Ref Range   Preg Test, Ur NEGATIVE NEGATIVE  POC Urinalysis dipstick  Result Value Ref Range   Glucose, UA NEGATIVE NEGATIVE mg/dL   Bilirubin Urine NEGATIVE NEGATIVE   Ketones, ur NEGATIVE NEGATIVE mg/dL   Specific Gravity, Urine 1.025 1.005 - 1.030   Hgb urine dipstick TRACE (A) NEGATIVE   pH 6.0 5.0 - 8.0   Protein, ur NEGATIVE NEGATIVE mg/dL   Urobilinogen, UA 0.2 0.0 - 1.0 mg/dL   Nitrite NEGATIVE NEGATIVE   Leukocytes,Ua NEGATIVE NEGATIVE   Labs Reviewed  CBC WITH DIFFERENTIAL/PLATELET - Abnormal; Notable for the following components:      Result Value   Hemoglobin 11.4 (*)    HCT 35.2 (*)     All other components within normal limits  COMPREHENSIVE METABOLIC PANEL - Abnormal; Notable for the following components:   BUN 5 (*)    Calcium 8.7 (*)    All other components within normal limits  POCT URINALYSIS DIPSTICK, ED / UC - Abnormal; Notable for the following components:   Hgb urine dipstick TRACE (*)    All other components within normal limits  LIPASE, BLOOD  POC URINE PREG, ED  CERVICOVAGINAL ANCILLARY ONLY    Imaging: No results found.   Allergies  Allergen Reactions   Sulfa Antibiotics     Nausea/vomiting, weakness                                               Past Medical History:  Diagnosis Date   Eczema    Positive ANA (antinuclear antibody) 05/18/2021   Concern for Lupus   Elevated sed rate  Repeat ANA neg  dsDNA neg  SSA/SSB- Negative   Syphilis 05/09/2021   05-08-21  Positive RPR with titer of 1:32--> treated with bicillin x3   [x]  6 month titer: 1:32 (inappropriate drop) Retreated [name] x 3  [x]  12 month titer: 1:8 (appropriate drop)  [ ]  18  month titer (12 months after second treatment)          Social History   Socioeconomic History   Marital status: Single    Spouse name: Not on file   Number of children: Not on file   Years of education: Not on file   Highest education level: Not on file  Occupational History   Not on file  Tobacco Use   Smoking status: Never   Smokeless tobacco: Never  Vaping Use   Vaping Use: Never used  Substance and Sexual Activity   Alcohol use: Never   Drug use: Never   Sexual activity: Yes    Birth control/protection: None  Other Topics Concern   Not on file  Social History Narrative   Not on file   Social Determinants of Health   Financial Resource Strain: Not on file  Food Insecurity: Not on file  Transportation Needs: Not on file  Physical Activity: Not on file  Stress: Not on file  Social Connections: Not on file  Intimate Partner Violence: Not on file    Family History  Problem Relation Age  of Onset   Asthma Mother    Eczema Mother    Scleroderma Father    Melanoma Maternal Grandfather    Lung disease Neg Hx      Mardella Layman, MD 07/28/22 [phone]

## 2022-08-07 ENCOUNTER — Encounter: Payer: Self-pay | Admitting: Family Medicine

## 2022-08-07 ENCOUNTER — Ambulatory Visit (INDEPENDENT_AMBULATORY_CARE_PROVIDER_SITE_OTHER): Payer: Medicaid Other | Admitting: Family Medicine

## 2022-08-07 VITALS — BP 112/76 | HR 81 | Temp 97.8°F | Ht 68.0 in | Wt 170.0 lb

## 2022-08-07 DIAGNOSIS — R110 Nausea: Secondary | ICD-10-CM

## 2022-08-07 DIAGNOSIS — R1033 Periumbilical pain: Secondary | ICD-10-CM

## 2022-08-07 DIAGNOSIS — Z7689 Persons encountering health services in other specified circumstances: Secondary | ICD-10-CM

## 2022-08-07 DIAGNOSIS — N926 Irregular menstruation, unspecified: Secondary | ICD-10-CM | POA: Diagnosis not present

## 2022-08-07 LAB — POC URINALSYSI DIPSTICK (AUTOMATED)
Bilirubin, UA: NEGATIVE
Blood, UA: NEGATIVE
Glucose, UA: NEGATIVE
Ketones, UA: NEGATIVE
Leukocytes, UA: NEGATIVE
Nitrite, UA: NEGATIVE
Protein, UA: POSITIVE — AB
Spec Grav, UA: 1.025 (ref 1.010–1.025)
Urobilinogen, UA: 0.2 E.U./dL
pH, UA: 6.5 (ref 5.0–8.0)

## 2022-08-07 LAB — POCT URINE PREGNANCY: Preg Test, Ur: NEGATIVE

## 2022-08-07 NOTE — Patient Instructions (Signed)
Take Pepcid daily for the next 2-4 weeks to see if this helps improve your pain and nausea.   Keep an eye on any food triggers that may be causing your abdominal pain.   Try taking a probiotic. These are over the counter. Some good options are Align, Digestive Health or Culturelle.   The next time you have abdominal spasms,  try taking peppermint oil or IB Gard over the counter.   I have referred you to [facility] and they will call you to schedule a visit.   If your symptoms resolve before they can see you, it is ok to cancel at least 24 hours in advance.

## 2022-08-07 NOTE — Progress Notes (Signed)
New Patient Office Visit  Subjective    Patient ID: Angela Sexton, female    DOB: 07-22-1990  Age: 32 y.o. MRN: [id number]  CC:  Chief Complaint  Patient presents with   Establish Care    Would like referral to GI, severe abdominal pain behind naval going on since beginning of the year. Pain was so bad she can't move, when baby hits stomach it is still tender. Trying to figure out triggers (like after she eats or?)    HPI Neysa Geoffroy presents to establish care Previous medical care- [facility]  OB[facility]GYN- [facility] Rheumatologist-  Neurologist   Requesting referral to GI for persistent abdominal pain. She was evaluated at the [facility] urgent care 2 wks ago for abdominal pain. They prescribed Pepcid but she has not picked this up yet.  C/o periumbilical pain that feels like a "contraction" x 2 months. Pain is debilitating and occurs at least weekly.  States she has a bowel movement but it does not relieve her pain.  She has nausea. No vomiting.  Sensation of needing to have a bowel movement all the time.  Last bowel movement this morning and normal. No pain with it.  No diarrhea or constipation.  Pain is not associated with eating.   No blood in stool.   Denies hx of abdominal surgery.   Delivered her last baby Aug 23, 2021. Vaginal delivery.   She is under the care of neurology and rheumatology for +ANA, paresthesias, arthralgias and myalgias.   Scleroderma in father   Outpatient Encounter Medications as of 08/07/2022  Medication Sig   gabapentin (NEURONTIN) 300 MG capsule Take by mouth.   [DISCONTINUED] ibuprofen (ADVIL) 600 MG tablet Take 1 tablet (600 mg total) by mouth every 6 (six) hours as needed.   fluconazole (DIFLUCAN) 150 MG tablet Take 1 tablet (150 mg total) by mouth daily. (Patient not taking: Reported on 06/18/2022)   [DISCONTINUED] cefadroxil (DURICEF) 500 MG capsule Take 1 capsule (500 mg total) by mouth 2 (two) times daily. (Patient not  taking: Reported on 07/26/2022)   [DISCONTINUED] famotidine (PEPCID) 20 MG tablet Take 1 tablet (20 mg total) by mouth 2 (two) times daily.   [DISCONTINUED] oxyCODONE-acetaminophen (PERCOCET/ROXICET) 5-325 MG tablet Take 1 tablet by mouth every 4 (four) hours as needed for severe pain. (Patient not taking: Reported on 06/18/2022)   [DISCONTINUED] phenazopyridine (PYRIDIUM) 200 MG tablet Take 1 tablet (200 mg total) by mouth 3 (three) times daily as needed for pain. (Patient not taking: Reported on 06/18/2022)   [DISCONTINUED] prenatal vitamin w/FE, FA (PRENATAL 1 + 1) 27-1 MG TABS tablet Take 1 tablet by mouth daily at 12 noon. (Patient not taking: Reported on 06/04/2022)   No facility-administered encounter medications on file as of 08/07/2022.    Past Medical History:  Diagnosis Date   Eczema    Positive ANA (antinuclear antibody) 05/18/2021   Concern for Lupus   Elevated sed rate  Repeat ANA neg  dsDNA neg  SSA/SSB- Negative   Syphilis 05/09/2021   05-08-21  Positive RPR with titer of 1:32--> treated with bicillin x3    6 month titer: 1:32 (inappropriate drop) Retreated [name] x 3   12 month titer: 1:8 (appropriate drop)   18 month titer (12 months after second treatment)          Past Surgical History:  Procedure Laterality Date   WISDOM TOOTH EXTRACTION      Family History  Problem Relation Age of Onset  Asthma Mother    Eczema Mother    Scleroderma Father    Melanoma Maternal Grandfather    Lung disease Neg Hx     Social History   Socioeconomic History   Marital status: Single    Spouse name: Not on file   Number of children: Not on file   Years of education: Not on file   Highest education level: Not on file  Occupational History   Not on file  Tobacco Use   Smoking status: Never   Smokeless tobacco: Never  Vaping Use   Vaping Use: Never used  Substance and Sexual Activity   Alcohol use: Never   Drug use: Never   Sexual activity: Yes    Birth  control/protection: None  Other Topics Concern   Not on file  Social History Narrative   Not on file   Social Determinants of Health   Financial Resource Strain: Not on file  Food Insecurity: Not on file  Transportation Needs: Not on file  Physical Activity: Not on file  Stress: Not on file  Social Connections: Not on file  Intimate Partner Violence: Not on file    Review of Systems  Constitutional:  Negative for chills, fever and weight loss.  Respiratory:  Negative for cough and shortness of breath.   Cardiovascular:  Negative for chest pain, palpitations and leg swelling.  Gastrointestinal:  Positive for abdominal pain and nausea. Negative for constipation, diarrhea, heartburn and vomiting.  Genitourinary:  Negative for dysuria, flank pain, frequency, hematuria and urgency.  Musculoskeletal:  Positive for joint pain and myalgias.  Neurological:  Negative for dizziness and focal weakness.        Objective    BP 112/76 (BP Location: Left Arm, Patient Position: Sitting, Cuff Size: Large)   Pulse 81   Temp 97.8 F (36.6 C) (Temporal)   Ht  (1.727 m)   Wt 170 lb (77.1 kg)   SpO2 98%   BMI 25.85 kg/m   Physical Exam Constitutional:      General: She is not in acute distress.    Appearance: She is not ill-appearing.  HENT:     Mouth/Throat:     Mouth: Mucous membranes are moist.     Pharynx: Oropharynx is clear.  Eyes:     General: No scleral icterus.    Extraocular Movements: Extraocular movements intact.     Conjunctiva/sclera: Conjunctivae normal.  Cardiovascular:     Rate and Rhythm: Normal rate and regular rhythm.  Pulmonary:     Effort: Pulmonary effort is normal.     Breath sounds: Normal breath sounds.  Abdominal:     General: Bowel sounds are normal. There is no distension.     Palpations: Abdomen is soft.     Tenderness: There is no abdominal tenderness. There is no right CVA tenderness, left CVA tenderness, guarding or rebound. Negative signs  include Murphy's sign, McBurney's sign and psoas sign.  Musculoskeletal:        General: Normal range of motion.     Cervical back: Normal range of motion and neck supple.  Lymphadenopathy:     Cervical: No cervical adenopathy.  Skin:    General: Skin is warm and dry.  Neurological:     General: No focal deficit present.     Mental Status: She is alert and oriented to person, place, and time.     Cranial Nerves: No cranial nerve deficit.     Motor: No weakness.     Coordination:  Coordination normal.     Gait: Gait normal.  Psychiatric:        Mood and Affect: Mood normal.        Behavior: Behavior normal.        Thought Content: Thought content normal.         Assessment & Plan:   Problem List Items Addressed This Visit   None Visit Diagnoses     Periumbilical abdominal pain    -  Primary   Relevant Orders   Ambulatory referral to Gastroenterology   POCT Urinalysis Dipstick (Automated) (Completed)   Nausea       Relevant Orders   Ambulatory referral to Gastroenterology   POCT urine pregnancy (Completed)   POCT Urinalysis Dipstick (Automated) (Completed)   Late menses       Relevant Orders   POCT urine pregnancy (Completed)   Encounter to establish care          UA +pro, neg otherwise.  Urine preg- negative  She is new to me today. Establish care visit.  Followed by specialists including OB/GYN, neurologist, rheumatologist.  No red flag symptoms. Benign abdominal exam and imaging is not needed today. Reviewed note from recent urgent care visit.  Also reviewed neurology and rheumatology notes. Recommend trying Pepcid as prescribed for the next 4 weeks to see if this helps with nausea.  Discussed peppermint oil or IBgard for abdominal spasms. Referral to GI per request.  Discussed that if her symptoms are improving that she does not need to schedule with them.  She will follow-up if any new or worsening symptoms.   Return in about 3 months (around 11/06/2022).    Hetty Blend, NP-C

## 2022-09-03 ENCOUNTER — Encounter: Payer: Self-pay | Admitting: Gastroenterology

## 2022-09-10 ENCOUNTER — Encounter: Payer: Self-pay | Admitting: Family Medicine

## 2022-09-10 ENCOUNTER — Ambulatory Visit: Payer: Medicaid Other | Admitting: Nurse Practitioner

## 2022-09-10 NOTE — Telephone Encounter (Signed)
Would you like for pt to be seen to assess again?

## 2022-09-16 ENCOUNTER — Other Ambulatory Visit (HOSPITAL_COMMUNITY)
Admission: RE | Admit: 2022-09-16 | Discharge: 2022-09-16 | Disposition: A | Discharge status: Home / Self Care | Payer: Medicaid Other | Source: Ambulatory Visit | Attending: Family Medicine | Admitting: Family Medicine

## 2022-09-16 ENCOUNTER — Other Ambulatory Visit: Payer: Self-pay

## 2022-09-16 ENCOUNTER — Ambulatory Visit (INDEPENDENT_AMBULATORY_CARE_PROVIDER_SITE_OTHER): Payer: Medicaid Other

## 2022-09-16 VITALS — BP 106/74 | HR 72 | Ht 68.0 in | Wt 170.6 lb

## 2022-09-16 DIAGNOSIS — A549 Gonococcal infection, unspecified: Secondary | ICD-10-CM | POA: Diagnosis present

## 2022-09-16 NOTE — Progress Notes (Signed)
Angela Sexton is here for TOC for gonorrhea; Dx'd on 06/04/2022 and treated with Rocephin on 06/18/22.   Patient reports that both her and partner have received treatment for gonorrhea.   Self swab instructions given and specimen obtained. Explained patient will be contacted with any abnormal results. Patient is due for annual exam; offered for patient to schedule during checkout.  Meryl Crutch, RN 09/16/2022  3:28 PM

## 2022-09-16 NOTE — Addendum Note (Signed)
Addended by: Cinda Quest A on: 09/16/2022 03:53 PM   Modules accepted: Orders

## 2022-09-17 ENCOUNTER — Encounter: Payer: Self-pay | Admitting: Family Medicine

## 2022-09-17 LAB — CERVICOVAGINAL ANCILLARY ONLY
Bacterial Vaginitis (gardnerella): POSITIVE — AB
Chlamydia: NEGATIVE
Comment: NEGATIVE
Comment: NEGATIVE
Comment: NEGATIVE
Comment: NORMAL
Neisseria Gonorrhea: NEGATIVE
Trichomonas: NEGATIVE

## 2022-09-18 ENCOUNTER — Other Ambulatory Visit: Payer: Self-pay | Admitting: Obstetrics and Gynecology

## 2022-09-18 DIAGNOSIS — N760 Acute vaginitis: Secondary | ICD-10-CM

## 2022-09-18 MED ORDER — METRONIDAZOLE 500 MG PO TABS
500.0000 mg | ORAL_TABLET | Freq: Two times a day (BID) | ORAL | 0 refills | Status: DC
Start: 2022-09-18 — End: 2022-10-09

## 2022-09-19 ENCOUNTER — Telehealth (INDEPENDENT_AMBULATORY_CARE_PROVIDER_SITE_OTHER): Payer: Medicaid Other | Admitting: Family Medicine

## 2022-09-19 ENCOUNTER — Encounter: Payer: Self-pay | Admitting: Family Medicine

## 2022-09-19 DIAGNOSIS — Z8659 Personal history of other mental and behavioral disorders: Secondary | ICD-10-CM

## 2022-09-19 DIAGNOSIS — F3160 Bipolar disorder, current episode mixed, unspecified: Secondary | ICD-10-CM

## 2022-09-19 DIAGNOSIS — F4310 Post-traumatic stress disorder, unspecified: Secondary | ICD-10-CM | POA: Diagnosis not present

## 2022-09-19 NOTE — Patient Instructions (Signed)
Please go to the [facility]  [address] [place], [place] [address] [phone]  If you have any thoughts of hurting yourself, please call 911.

## 2022-09-19 NOTE — Progress Notes (Signed)
MyChart Video Visit    Virtual Visit via Video Note    Patient location: Home. Patient and provider in visit Provider location: Office  I discussed the limitations of evaluation and management by telemedicine and the availability of in person appointments. The patient expressed understanding and agreed to proceed.  Visit Date: 09/19/2022  Today's healthcare provider: Hetty Blend, NP-C     Subjective:    Patient ID: Angela Sexton, female    DOB: Jan 20, 1991, 32 y.o.   MRN: [id number]  Chief Complaint  Patient presents with   Depression    Bipolar and anxiety meds     HPI C/o worsening mood since having her baby over a year ago. States she was not having postpartum depression, current symptoms having been worsening for the past few months.  States she is having "melt downs", crying more often.  States she has a short fuse, is irritated easily at work.  States she has SI but does not have a plan and would not hurt herself.  Reports being diagnosed with Bipolar disorder, PTSD, anxiety and depression.   States she stopped therapy and medications about 1 1/2 years ago.  Recalls having severe manic episodes in the past.  Denies ever being hospitalized but states she came close to her mother having her IVCed.  She used to take [employer].   Her boyfriend is supportive.  Denies self medicating.      Past Medical History:  Diagnosis Date   Eczema    Positive ANA (antinuclear antibody) 05/18/2021   Concern for Lupus   Elevated sed rate  Repeat ANA neg  dsDNA neg  SSA/SSB- Negative   Syphilis 05/09/2021   05-08-21  Positive RPR with titer of 1:32--> treated with bicillin x3   [x]  6 month titer: 1:32 (inappropriate drop) Retreated [name] x 3  [x]  12 month titer: 1:8 (appropriate drop)  [ ]  18 month titer (12 months after second treatment)          Past Surgical History:  Procedure Laterality Date   WISDOM TOOTH EXTRACTION      Family History  Problem Relation Age of  Onset   Asthma Mother    Eczema Mother    Scleroderma Father    Melanoma Maternal Grandfather    Lung disease Neg Hx     Social History   Socioeconomic History   Marital status: Single    Spouse name: Not on file   Number of children: Not on file   Years of education: Not on file   Highest education level: Not on file  Occupational History   Not on file  Tobacco Use   Smoking status: Never   Smokeless tobacco: Never  Vaping Use   Vaping Use: Never used  Substance and Sexual Activity   Alcohol use: Never   Drug use: Never   Sexual activity: Yes    Birth control/protection: None  Other Topics Concern   Not on file  Social History Narrative   Not on file   Social Determinants of Health   Financial Resource Strain: Not on file  Food Insecurity: Not on file  Transportation Needs: Not on file  Physical Activity: Not on file  Stress: Not on file  Social Connections: Not on file  Intimate Partner Violence: Not on file    Outpatient Medications Prior to Visit  Medication Sig Dispense Refill   gabapentin (NEURONTIN) 300 MG capsule Take by mouth.     metroNIDAZOLE (FLAGYL) 500 MG tablet Take 1  tablet (500 mg total) by mouth 2 (two) times daily. 14 tablet 0   No facility-administered medications prior to visit.    Allergies  Allergen Reactions   Sulfa Antibiotics     Nausea/vomiting, weakness    Review of Systems  Constitutional:  Negative for chills and fever.  Respiratory:  Negative for shortness of breath.   Cardiovascular:  Negative for chest pain and palpitations.  Gastrointestinal:  Negative for abdominal pain, constipation, diarrhea, nausea and vomiting.  Genitourinary:  Negative for dysuria, frequency and urgency.  Neurological:  Negative for dizziness, focal weakness and headaches.  Psychiatric/Behavioral:  Positive for depression and suicidal ideas. Negative for hallucinations and substance abuse. The patient is nervous/anxious and has insomnia.         Objective:    Physical Exam Constitutional:      General: She is not in acute distress.    Appearance: She is not ill-appearing.  Pulmonary:     Effort: Pulmonary effort is normal.  Musculoskeletal:     Cervical back: Normal range of motion.  Neurological:     General: No focal deficit present.     Mental Status: She is alert and oriented to person, place, and time.  Psychiatric:        Attention and Perception: Attention normal.        Mood and Affect: Mood normal.        Speech: Speech normal.        Behavior: Behavior normal.        Thought Content: Thought content normal. Thought content does not include suicidal ideation. Thought content does not include homicidal or suicidal plan.     LMP  (LMP Unknown)  Wt Readings from Last 3 Encounters:  09/16/22 170 lb 9.6 oz (77.4 kg)  08/07/22 170 lb (77.1 kg)  12/25/21 175 lb (79.4 kg)       Assessment & Plan:   Problem List Items Addressed This Visit   None Visit Diagnoses     Bipolar affective disorder, current episode mixed, current episode severity unspecified (HCC)    -  Primary   PTSD (post-traumatic stress disorder)       History of suicidal ideation          She does not appear to be in any danger at the moment.  Denies plan of self harm.  Long history of mental health conditions.  Off of medications for 1 and half years approximately.  Has not been involved in therapy recently.  Previously was taking Vraylar and another medication for anxiety but cannot recall the name of it. Encouraged her to go to the [facility] behavioral health urgent care on [address] and she agrees.   I am having Angela Sexton maintain her gabapentin and metroNIDAZOLE.  No orders of the defined types were placed in this encounter.   I discussed the assessment and treatment plan with the patient. The patient was provided an opportunity to ask questions and all were answered. The patient agreed with the plan and demonstrated an  understanding of the instructions.   The patient was advised to call back or seek an in-person evaluation if the symptoms worsen or if the condition fails to improve as anticipated.  I provided 22 minutes of face-to-face time during this encounter.   Hetty Blend, NP-C [facility] [place] [phone] (phone) [phone] (fax)  [facility]

## 2022-09-24 ENCOUNTER — Encounter: Payer: Self-pay | Admitting: Family Medicine

## 2022-09-24 ENCOUNTER — Ambulatory Visit: Payer: Medicaid Other | Admitting: Family Medicine

## 2022-09-24 NOTE — Telephone Encounter (Signed)
fyi

## 2022-10-09 ENCOUNTER — Ambulatory Visit (INDEPENDENT_AMBULATORY_CARE_PROVIDER_SITE_OTHER): Payer: Medicaid Other | Admitting: Obstetrics and Gynecology

## 2022-10-09 ENCOUNTER — Other Ambulatory Visit: Payer: Self-pay

## 2022-10-09 ENCOUNTER — Other Ambulatory Visit (HOSPITAL_COMMUNITY)
Admission: RE | Admit: 2022-10-09 | Discharge: 2022-10-09 | Disposition: A | Discharge status: Home / Self Care | Payer: Medicaid Other | Source: Ambulatory Visit | Attending: Obstetrics and Gynecology | Admitting: Obstetrics and Gynecology

## 2022-10-09 VITALS — BP 129/86 | HR 71 | Wt 172.5 lb

## 2022-10-09 DIAGNOSIS — B9689 Other specified bacterial agents as the cause of diseases classified elsewhere: Secondary | ICD-10-CM

## 2022-10-09 DIAGNOSIS — Z30011 Encounter for initial prescription of contraceptive pills: Secondary | ICD-10-CM | POA: Diagnosis not present

## 2022-10-09 DIAGNOSIS — N760 Acute vaginitis: Secondary | ICD-10-CM

## 2022-10-09 DIAGNOSIS — Z0000 Encounter for general adult medical examination without abnormal findings: Secondary | ICD-10-CM | POA: Diagnosis not present

## 2022-10-09 MED ORDER — NORETHINDRONE 0.35 MG PO TABS
1.0000 | ORAL_TABLET | Freq: Every day | ORAL | 11 refills | Status: DC
Start: 2022-10-09 — End: 2023-06-09

## 2022-10-09 NOTE — Progress Notes (Signed)
ANNUAL EXAM Patient name: Angela Sexton MRN [id number]  Date of birth: 02/21/1991 Chief Complaint:   annual exam  History of Present Illness:   Angela Sexton is a 32 y.o. [phone]  female being seen today for a routine annual exam.  Current complaints: Had a baby one year ago, still breastfeeding. Cycles used to be light 3-4 days, after having baby started out heavy last 4-5 days. Recent period was light 3-4 days. Desires pills today for contraception.    Patient's last menstrual period was 10/03/2022 (exact date).   The pregnancy intention screening data noted above was reviewed. Potential methods of contraception were discussed. The patient elected to proceed with OCP  Last pap 03/19/21. Results were:  reports normal . H/O abnormal pap: no Last mammogram: n/a. Results were: N/A. Family h/o breast cancer: no Last colonoscopy: n/a. Results were: N/A. Family h/o colorectal cancer: no     08/07/2022    3:32 PM 10/02/2021   10:04 AM 06/05/2021   10:08 AM 05/08/2021    9:21 AM  Depression screen PHQ 2/9  Decreased Interest 0 3 1 2   Down, Depressed, Hopeless 0 1 2 2   PHQ - 2 Score 0 4 3 4   Altered sleeping  0 0 3  Tired, decreased energy  3 3 2   Change in appetite  3 0 0  Feeling bad or failure about yourself   3 2 3   Trouble concentrating  3 2 2   Moving slowly or fidgety/restless  1 1 1   Suicidal thoughts  1 1 1   PHQ-9 Score  18 12 16         10/02/2021   10:07 AM 06/05/2021   10:09 AM 05/08/2021    9:21 AM  GAD 7 : Generalized Anxiety Score  Nervous, Anxious, on Edge 3 2 2   Control/stop worrying 3 3 3   Worry too much - different things 3 3 3   Trouble relaxing 0 1 1  Restless 0 1 1  Easily annoyed or irritable 3 3 3   Afraid - awful might happen 3 1 1   Total GAD 7 Score 15 14 14      Review of Systems:   Pertinent items are noted in HPI Denies any headaches, blurred vision, fatigue, shortness of breath, chest pain, abdominal pain, abnormal vaginal  discharge/itching/odor/irritation, problems with periods, bowel movements, urination, or intercourse unless otherwise stated above. Pertinent History Reviewed:  Reviewed past medical,surgical, social and family history.  Reviewed problem list, medications and allergies. Physical Assessment:   Vitals:   10/09/22 0835  BP: 129/86  Pulse: 71  Weight: 172 lb 8 oz (78.2 kg)  Body mass index is 26.23 kg/m.        Physical Examination:   General appearance - well appearing, and in no distress  Mental status - alert, oriented to person, place, and time  Psych:  She has a normal mood and affect  Skin - warm and dry, normal color, no suspicious lesions noted  Chest - effort normal, all lung fields clear to auscultation bilaterally  Heart - normal rate and regular rhythm  Neck:  midline trachea, no thyromegaly or nodules  Abdomen - soft, nontender  Pelvic - deferred   Extremities:  No swelling or varicosities noted   No results found for this or any previous visit (from the past 24 hour(s)).  Assessment & Plan:  1. Encounter for oral contraception initial prescription Doing well, UTD on pap, will follow up next year  - norethindrone (MICRONOR) 0.35 MG tablet;  Take 1 tablet (0.35 mg total) by mouth daily.  Dispense: 30 tablet; Refill: 11  2. BV (bacterial vaginosis) Hx of bv and yeast, would like to check today, no s&s - Cervicovaginal ancillary only  Mammogram: @ 30yo, or sooner if problems Colonoscopy: @ 30yo, or sooner if problems  No orders of the defined types were placed in this encounter.   Meds:  Meds ordered this encounter  Medications   norethindrone (MICRONOR) 0.35 MG tablet    Sig: Take 1 tablet (0.35 mg total) by mouth daily.    Dispense:  30 tablet    Refill:  11    Follow-up: Return in about 1 year (around 10/09/2023) for Thayer Jew, FNP

## 2022-10-10 ENCOUNTER — Encounter: Payer: Self-pay | Admitting: Gastroenterology

## 2022-10-10 ENCOUNTER — Ambulatory Visit (INDEPENDENT_AMBULATORY_CARE_PROVIDER_SITE_OTHER): Payer: Medicaid Other | Admitting: Gastroenterology

## 2022-10-10 ENCOUNTER — Other Ambulatory Visit (INDEPENDENT_AMBULATORY_CARE_PROVIDER_SITE_OTHER): Payer: Medicaid Other

## 2022-10-10 VITALS — BP 110/74 | HR 77 | Ht 68.0 in | Wt 172.1 lb

## 2022-10-10 DIAGNOSIS — R1033 Periumbilical pain: Secondary | ICD-10-CM

## 2022-10-10 LAB — CERVICOVAGINAL ANCILLARY ONLY
Bacterial Vaginitis (gardnerella): NEGATIVE
Candida Glabrata: NEGATIVE
Candida Vaginitis: POSITIVE — AB
Comment: NEGATIVE
Comment: NEGATIVE
Comment: NEGATIVE

## 2022-10-10 LAB — SEDIMENTATION RATE: Sed Rate: 28 mm/hr — ABNORMAL HIGH (ref 0–20)

## 2022-10-10 LAB — C-REACTIVE PROTEIN: CRP: <1 mg/dL (ref 0.5–20.0)

## 2022-10-10 NOTE — Patient Instructions (Signed)
_______________________________________________________  If your blood pressure at your visit was 140/90 or greater, please contact your primary care physician to follow up on this.  _______________________________________________________  If you are age 30 or older, your body mass index should be between 23-30. Your Body mass index is 26.17 kg/m. If this is out of the aforementioned range listed, please consider follow up with your Primary Care Provider.  If you are age 17 or younger, your body mass index should be between 19-25. Your Body mass index is 26.17 kg/m. If this is out of the aformentioned range listed, please consider follow up with your Primary Care Provider.   Your provider has requested that you go to the basement level for lab work before leaving today. Press "B" on the elevator. The lab is located at the first door on the left as you exit the elevator.  You have been scheduled for an abdominal ultrasound at [facility] (1st floor of hospital) on 10/16/22 at 9am. Please arrive 30 minutes prior to your appointment for registration. Make certain not to have anything to eat or drink 6 hours prior to your appointment. Should you need to reschedule your appointment, please contact radiology at [phone]. This test typically takes about 30 minutes to perform.   Due to recent changes in healthcare laws, you may see the results of your imaging and laboratory studies on MyChart before your provider has had a chance to review them.  We understand that in some cases there may be results that are confusing or concerning to you. Not all laboratory results come back in the same time frame and the provider may be waiting for multiple results in order to interpret others.  Please give [place] 48 hours in order for your provider to thoroughly review all the results before contacting the office for clarification of your results.   ________________________________________________________  The  [facility] providers would like to encourage you to use [facility] to communicate with providers for non-urgent requests or questions.  Due to long hold times on the telephone, sending your provider a message by [facility] may be a faster and more efficient way to get a response.  Please allow 48 business hours for a response.  Please remember that this is for non-urgent requests.  It was a pleasure to see you today!  Thank you for trusting me with your gastrointestinal care!    Scott E.Tomasa Rand, MD

## 2022-10-10 NOTE — Progress Notes (Signed)
HPI : Angela Sexton is a 32 y.o. female who is referred to [place] by Hetty Blend L, NP-C for further evaluation of chronic abdominal pain.  The patient states that she has had issues with abdominal pain for about a year now.  The pain is episodic, and typically last several days to a few weeks at a time.  She estimates having 4-5 bouts of this pain total since it first started a year ago.  Her last episode was in April.  She went to Urgent care at that time.  CBC, CMP and lipase were unremarkable and she was prescribed Pepcid. When she has the pain, it is typically constant until it resolves.  The first time she had it, it was severe, 10/10, subsequent episodes have been less severe. She denies having any other symptoms with the pain, such as nausea, vomiting or diarrhea.  Typically she eats less when she has the pain, but she denies that eating particularly exacerbates the pain. She has regular bowel movements and denies any problems with constipation, diarrhea or blood in the stool. In between these episodes, she denies any chronic GI symptoms.  She has not identified any triggers for these pain episodes.  They are not associated with her menses.  They do not appear to be related to any foods.  When asked, she does admit that stress may be related.  She has had several stressful episodes, around the times of her abdominal pain.  Her father had scleroderma, otherwise no family history of any GI illness  Past Medical History:  Diagnosis Date   Anemia    Anxiety    Depression    Eczema    Pneumonia    infant   Positive ANA (antinuclear antibody) 05/18/2021   Concern for Lupus   Elevated sed rate  Repeat ANA neg  dsDNA neg  SSA/SSB- Negative   Syphilis 05/09/2021   05-08-21  Positive RPR with titer of 1:32--> treated with bicillin x3   [x]  6 month titer: 1:32 (inappropriate drop) Retreated [name] x 3  [x]  12 month titer: 1:8 (appropriate drop)  [ ]  18 month titer (12 months after second  treatment)           Past Surgical History:  Procedure Laterality Date   WISDOM TOOTH EXTRACTION     Family History  Problem Relation Age of Onset   Asthma Mother    Eczema Mother    Scleroderma Father    Melanoma Maternal Grandfather    Lung disease Neg Hx    Social History   Tobacco Use   Smoking status: Never   Smokeless tobacco: Never  Vaping Use   Vaping Use: Never used  Substance Use Topics   Alcohol use: Never   Drug use: Never   Current Outpatient Medications  Medication Sig Dispense Refill   hydrOXYzine (ATARAX) 25 MG tablet Take 25 mg by mouth 3 (three) times daily.     lamoTRIgine (LAMICTAL) 25 MG tablet Take 50 mg by mouth daily.     norethindrone (MICRONOR) 0.35 MG tablet Take 1 tablet (0.35 mg total) by mouth daily. 30 tablet 11   No current facility-administered medications for this visit.   Allergies  Allergen Reactions   Sulfa Antibiotics     Nausea/vomiting, weakness     Review of Systems: All systems reviewed and negative except where noted in HPI.    No results found.  Physical Exam: BP 110/74   Pulse 77   Ht 5\' 8"  (1.727  m)   Wt 172 lb 2 oz (78.1 kg)   LMP 10/03/2022 (Exact Date)   BMI 26.17 kg/m  Constitutional: Pleasant,well-developed, African-American female in no acute distress. HEENT: Normocephalic and atraumatic. Conjunctivae are normal. No scleral icterus. Neck supple.  Cardiovascular: Normal rate, regular rhythm.  Pulmonary/chest: Effort normal and breath sounds normal. No wheezing, rales or rhonchi. Abdominal: Soft, nondistended, nontender. Bowel sounds active throughout. There are no masses palpable. No hepatomegaly. Extremities: no edema Lymphadenopathy: No cervical adenopathy noted. Neurological: Alert and oriented to person place and time. Skin: Skin is warm and dry. No rashes noted. Psychiatric: Normal mood and affect. Behavior is normal.  CBC    Component Value Date/Time   WBC 7.9 07/26/2022 1509   RBC 3.87  07/26/2022 1509   HGB 11.4 (L) 07/26/2022 1509   HGB 10.5 (L) 05/31/2021 0836   HCT 35.2 (L) 07/26/2022 1509   HCT 31.1 (L) 05/31/2021 0836   PLT 283 07/26/2022 1509   PLT 247 05/31/2021 0836   MCV 91.0 07/26/2022 1509   MCV 89 05/31/2021 0836   MCH 29.5 07/26/2022 1509   MCHC 32.4 07/26/2022 1509   RDW 13.5 07/26/2022 1509   RDW 12.2 05/31/2021 0836   LYMPHSABS 3.3 07/26/2022 1509   LYMPHSABS 2.3 05/08/2021 1025   MONOABS 0.5 07/26/2022 1509   EOSABS 0.2 07/26/2022 1509   EOSABS 0.1 05/08/2021 1025   BASOSABS 0.1 07/26/2022 1509   BASOSABS 0.0 05/08/2021 1025    CMP     Component Value Date/Time   NA 137 07/26/2022 1509   K 3.8 07/26/2022 1509   CL 104 07/26/2022 1509   CO2 26 07/26/2022 1509   GLUCOSE 81 07/26/2022 1509   BUN 5 (L) 07/26/2022 1509   CREATININE 0.59 07/26/2022 1509   CREATININE 0.67 11/06/2020 1301   CALCIUM 8.7 (L) 07/26/2022 1509   PROT 6.9 07/26/2022 1509   ALBUMIN 3.9 07/26/2022 1509   AST 17 07/26/2022 1509   AST 11 (L) 11/06/2020 1301   ALT 12 07/26/2022 1509   ALT 8 11/06/2020 1301   ALKPHOS 53 07/26/2022 1509   BILITOT 0.8 07/26/2022 1509   BILITOT 0.7 11/06/2020 1301   GFRNONAA >60 07/26/2022 1509   GFRNONAA >60 11/06/2020 1301       Latest Ref Rng & Units 07/26/2022    3:09 PM 08/24/2021    4:58 AM 08/22/2021    1:07 PM  CBC EXTENDED  WBC 4.0 - 10.5 K/uL 7.9  8.1  7.8   RBC 3.87 - 5.11 MIL/uL 3.87  3.43  4.09   Hemoglobin 12.0 - 15.0 g/dL 43.3  29.5  18.8   HCT 36.0 - 46.0 % 35.2  30.7  36.8   Platelets 150 - 400 K/uL 283  225  263   NEUT# 1.7 - 7.7 K/uL 3.9     Lymph# 0.7 - 4.0 K/uL 3.3         ASSESSMENT AND PLAN:  32 year old female with 1 year history of intermittent episodes of abdominal pain, without other GI symptoms.  No chronic GI symptoms in between these episodes.  Her last episode was 2 months ago.  Her symptoms do not seem consistent with most common causes of abdominal pain.  We will exclude common causes of pain to  include gallstones, H. pylori and celiac disease.  Will also obtain inflammatory markers and fecal elastase.  Given the absence of chronic symptoms, no need for empiric medical therapy.  Would not recommend any dietary changes given the infrequent  nature of her symptoms.  Abdominal pain - RUQUS - H. Pylori stool antigen - TTG/IgA - fecal elastase  Allea Kassner E. Tomasa Rand, MD [facility]  Avanell Shackleton, NP-C

## 2022-10-11 LAB — IGA: Immunoglobulin A: 243 mg/dL (ref 47–310)

## 2022-10-11 LAB — TISSUE TRANSGLUTAMINASE, IGA: (tTG) Ab, IgA: <1 U/mL

## 2022-10-11 MED ORDER — FLUCONAZOLE 150 MG PO TABS: 150.0000 mg | ORAL_TABLET | Freq: Once | ORAL | 0 refills | Status: AC

## 2022-10-11 NOTE — Addendum Note (Signed)
Addended by: Sue Lush on: 10/11/2022 04:20 PM   Modules accepted: Orders

## 2022-10-15 ENCOUNTER — Other Ambulatory Visit: Payer: Medicaid Other

## 2022-10-15 DIAGNOSIS — R1033 Periumbilical pain: Secondary | ICD-10-CM

## 2022-10-15 NOTE — Progress Notes (Signed)
Angela Sexton,  Your lab tests were all normal/reassuring.  Tests for celiac disease were normal.  CRP (inflammatory marker) was normal.  ESR (alternative inflammatory marker) was slightly elevated, but much less so than in previous years.  Very unlikely to represent Crohn's disease. Awaiting stool test results

## 2022-10-16 ENCOUNTER — Ambulatory Visit (HOSPITAL_COMMUNITY): Admission: RE | Admit: 2022-10-16 | Payer: Medicaid Other | Source: Ambulatory Visit

## 2022-10-16 ENCOUNTER — Encounter (HOSPITAL_COMMUNITY): Payer: Self-pay

## 2022-10-17 LAB — H. PYLORI ANTIGEN, STOOL: H pylori Ag, Stl: NEGATIVE

## 2022-10-21 NOTE — Progress Notes (Signed)
Angela Sexton,  Your H. Pylori test was negative (not detected).  Will await results from ultrasound.

## 2022-10-22 LAB — PANCREATIC ELASTASE, FECAL: Pancreatic Elastase-1, Stool: >500 mcg/g

## 2022-11-04 ENCOUNTER — Other Ambulatory Visit: Payer: Self-pay | Admitting: Lactation Services

## 2022-11-04 DIAGNOSIS — A549 Gonococcal infection, unspecified: Secondary | ICD-10-CM

## 2022-11-05 ENCOUNTER — Other Ambulatory Visit: Payer: Medicaid Other

## 2022-11-26 DIAGNOSIS — F319 Bipolar disorder, unspecified: Secondary | ICD-10-CM | POA: Insufficient documentation

## 2022-11-26 HISTORY — DX: Bipolar disorder, unspecified: F31.9

## 2022-11-27 ENCOUNTER — Other Ambulatory Visit: Payer: Self-pay

## 2022-11-27 ENCOUNTER — Emergency Department (HOSPITAL_BASED_OUTPATIENT_CLINIC_OR_DEPARTMENT_OTHER): Payer: Medicaid Other | Admitting: Radiology

## 2022-11-27 ENCOUNTER — Emergency Department (HOSPITAL_BASED_OUTPATIENT_CLINIC_OR_DEPARTMENT_OTHER)
Admission: EM | Admit: 2022-11-27 | Discharge: 2022-11-28 | Disposition: A | Discharge status: Home / Self Care | Payer: Medicaid Other | Attending: Emergency Medicine | Admitting: Emergency Medicine

## 2022-11-27 DIAGNOSIS — S93402A Sprain of unspecified ligament of left ankle, initial encounter: Secondary | ICD-10-CM | POA: Diagnosis not present

## 2022-11-27 DIAGNOSIS — Y92410 Unspecified street and highway as the place of occurrence of the external cause: Secondary | ICD-10-CM | POA: Insufficient documentation

## 2022-11-27 DIAGNOSIS — S99912A Unspecified injury of left ankle, initial encounter: Secondary | ICD-10-CM | POA: Diagnosis present

## 2022-11-27 MED ORDER — IBUPROFEN 400 MG PO TABS
400.0000 mg | ORAL_TABLET | Freq: Once | ORAL | Status: AC | PRN
  Administered 2022-11-27: 400 mg via ORAL
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
## 2022-11-28 NOTE — Discharge Instructions (Signed)
Apply ice for 30 minutes at a time, 4 times a day.  Use crutches as needed.  Take ibuprofen or naproxen as needed for pain.  For additional pain relief, add acetaminophen.

## 2022-12-03 ENCOUNTER — Ambulatory Visit: Payer: Medicaid Other | Admitting: Family Medicine

## 2023-01-13 IMAGING — DX DG CHEST 2V
3 series · 3 of 3 positions shown · non-contrast
Comparison: CT chest [REDACTED], chest x-ray 09/27/2020

CLINICAL DATA: Chest pain

EXAM:
CHEST - 2 VIEW

[chest pa (1 of 2)]
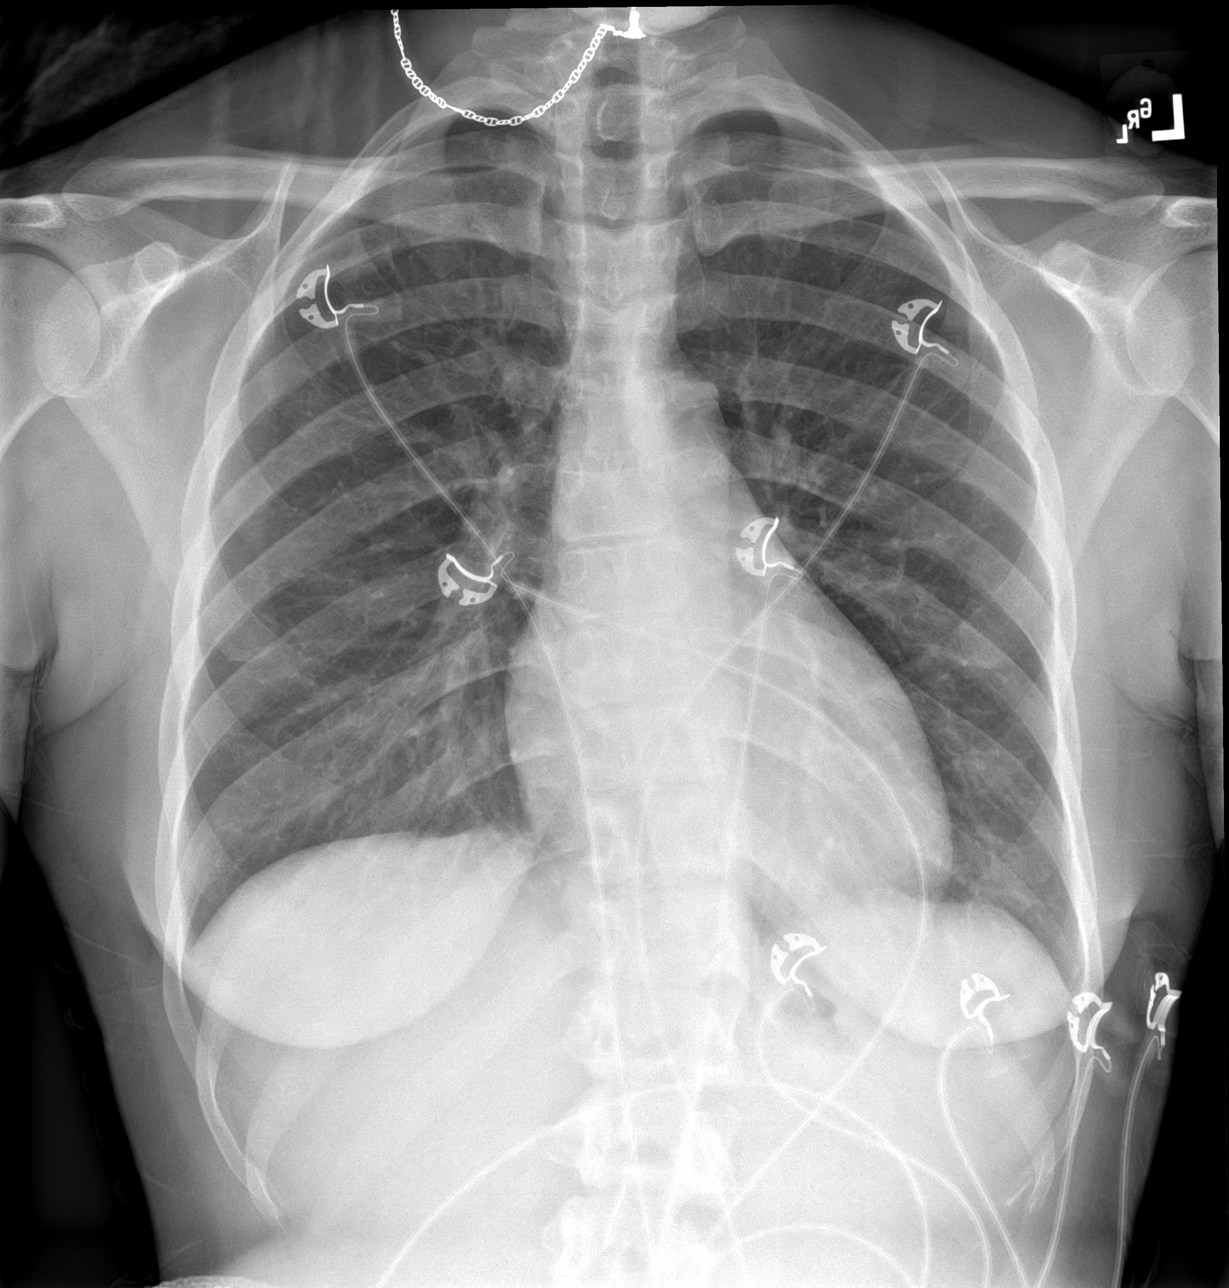

[chest lat]
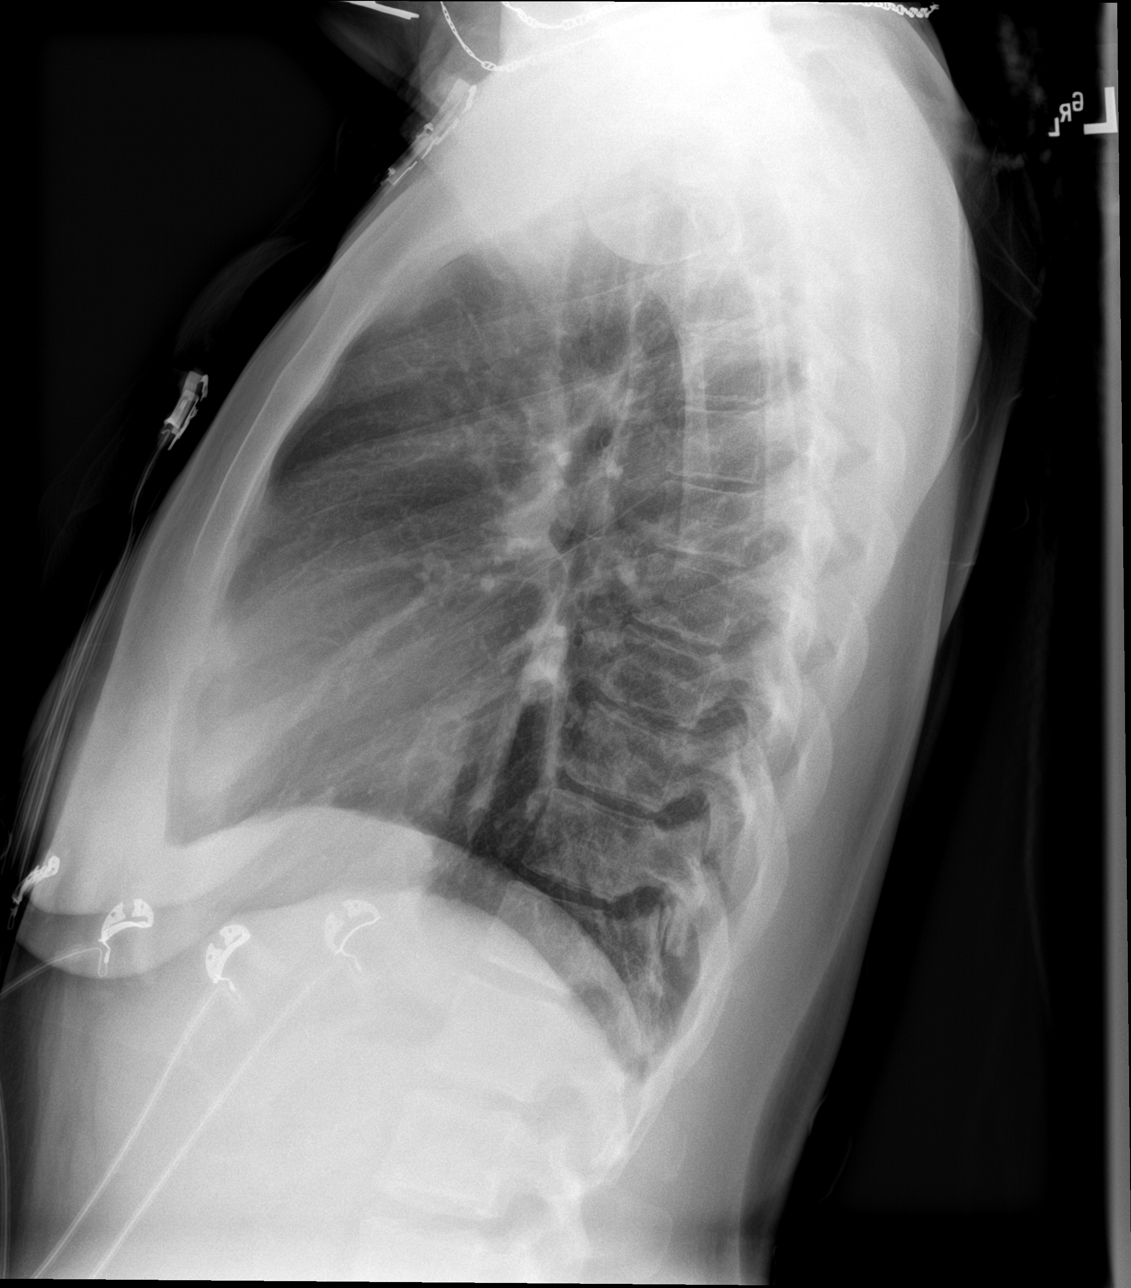

[chest pa (2 of 2)]
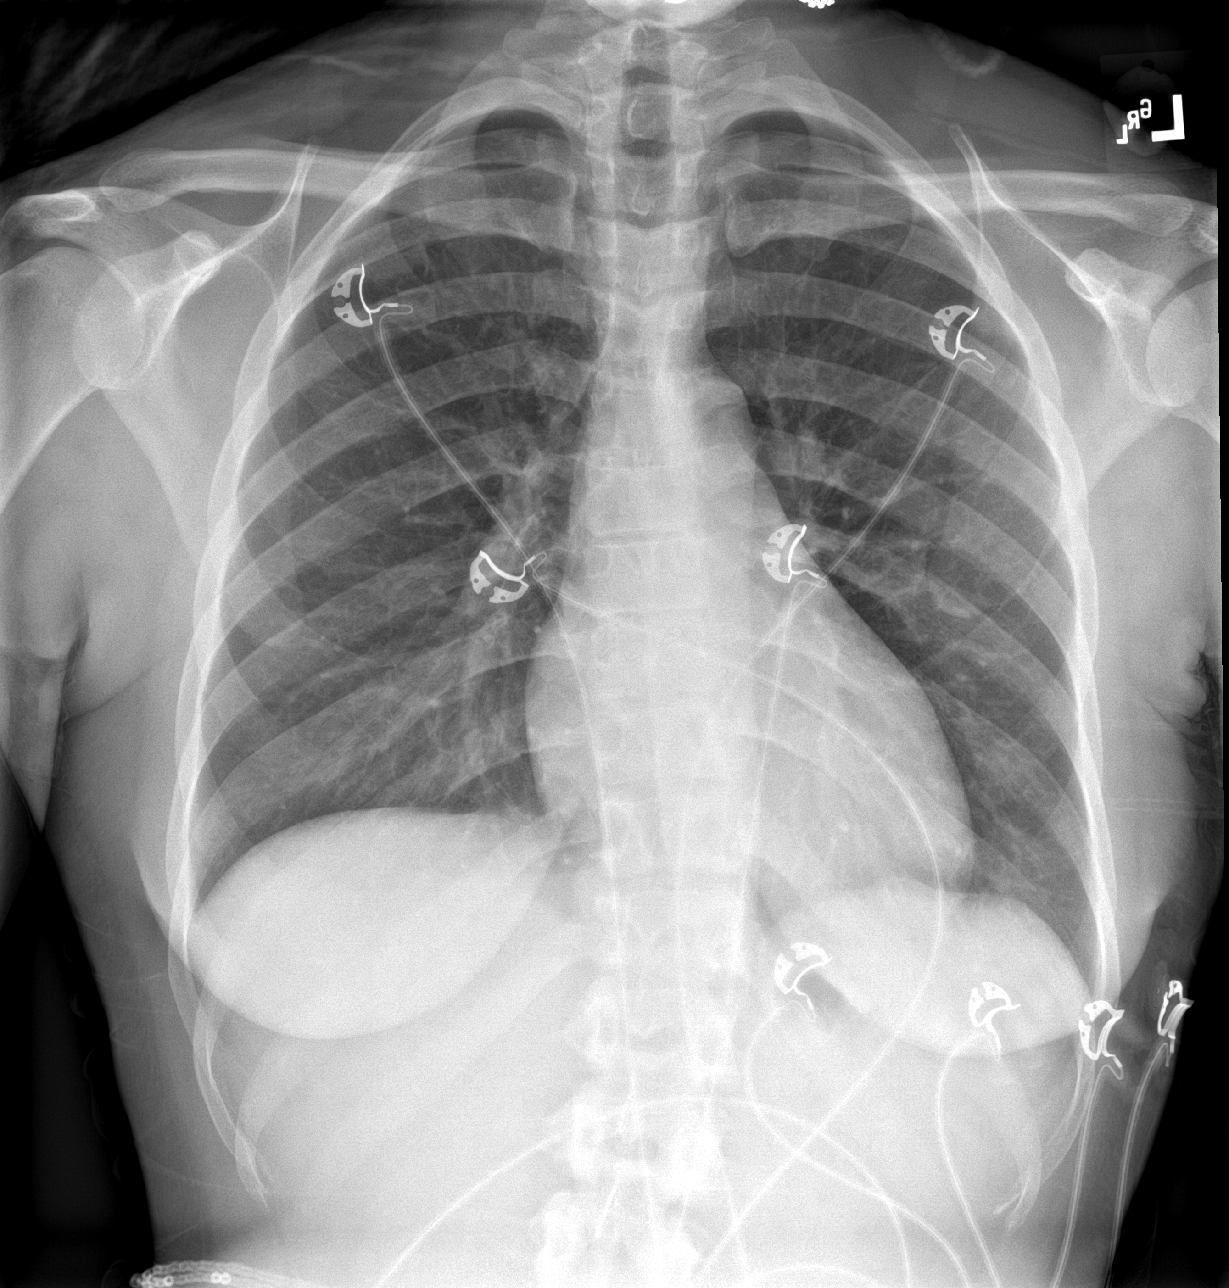

[3 of 3 positions shown; findings below may reference images not displayed]

FINDINGS: The heart size and mediastinal contours are within normal limits.

No focal consolidation. No pulmonary edema. No pleural effusion. No
pneumothorax.

No acute osseous abnormality.
IMPRESSION: No active cardiopulmonary disease.

## 2023-01-13 IMAGING — CT CT ANGIO CHEST
2 of 7 series · 13 of 36 positions shown · IV contrast (omnipaque)
Comparison: None.

CLINICAL DATA: chest pain that started a week ago. Pt states it is
in the middle of her chest and sometimes goes to her back.

EXAM:
CT ANGIOGRAPHY CHEST WITH CONTRAST
TECHNIQUE: Multidetector CT imaging of the chest was performed using the
standard protocol during bolus administration of intravenous
contrast. Multiplanar CT image reconstructions and MIPs were
obtained to evaluate the vascular anatomy.
CONTRAST:  100mL OMNIPAQUE IOHEXOL 350 MG/ML SOLN

[Series 5: pe axial thins · axial · 0.58mm/px · z∈[+1283,+1506]mm · 12 of 265 slices shown]
[im 21/265  lung]
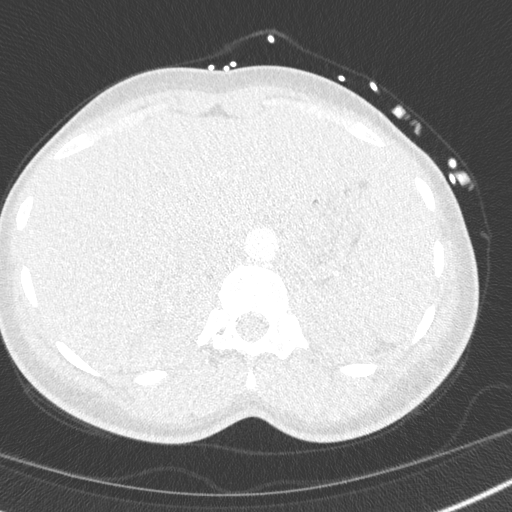
[im 41/265  mediastinal]
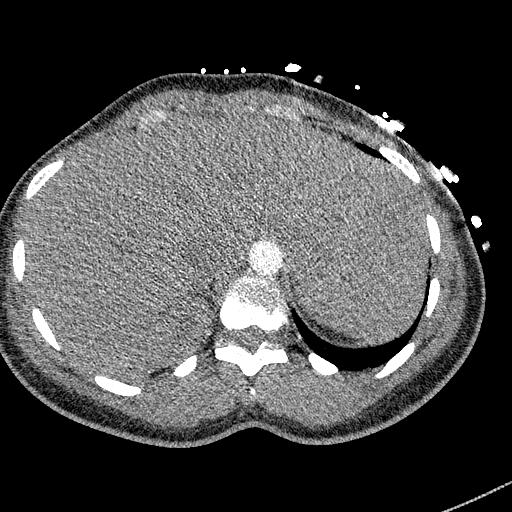
[im 61/265  lung]
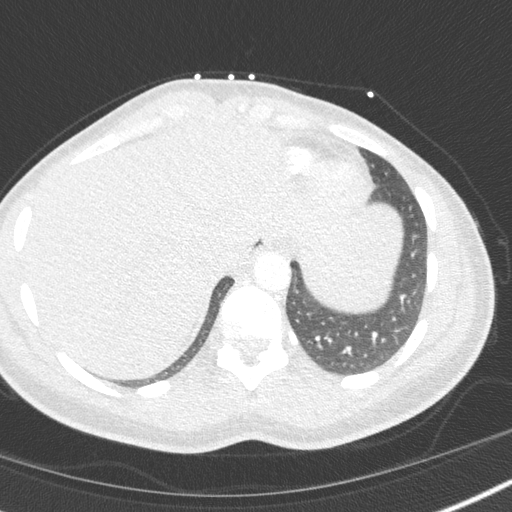
[im 82/265  mediastinal]
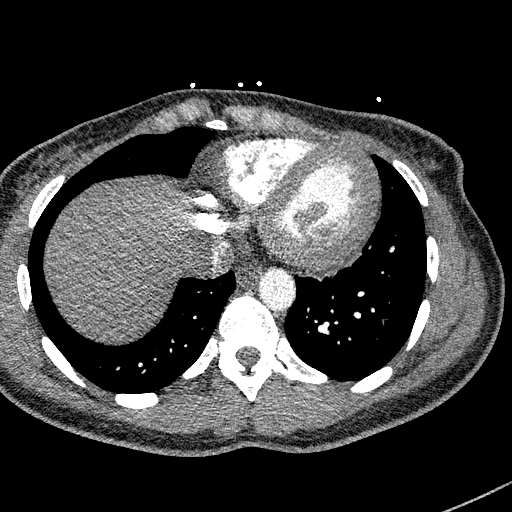
[im 102/265  lung]
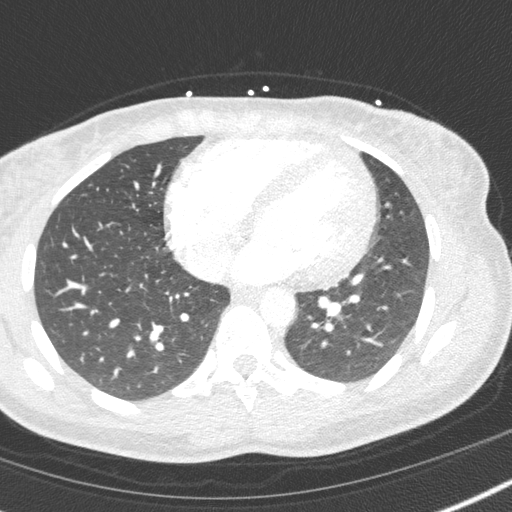
[im 122/265  mediastinal]
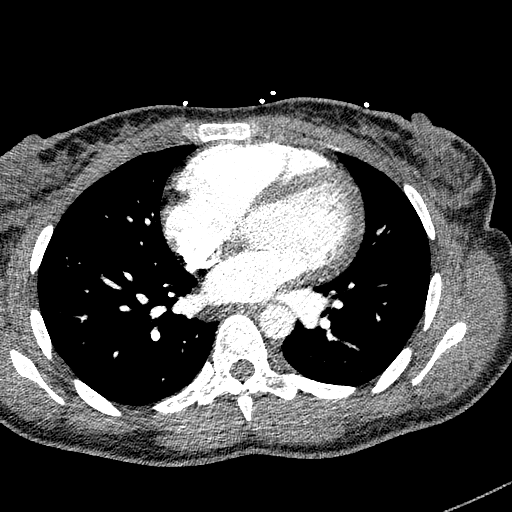
[im 143/265  lung]
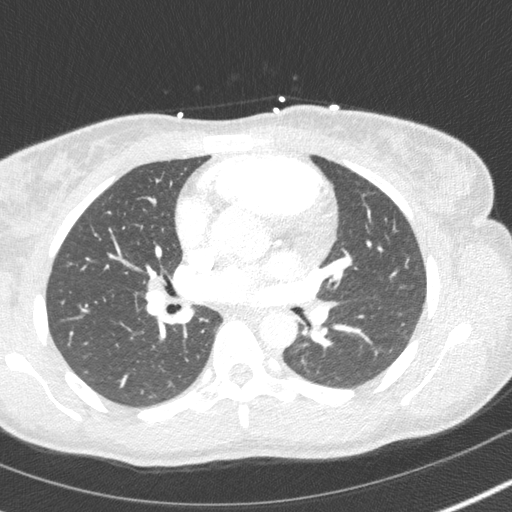
[im 163/265  mediastinal]
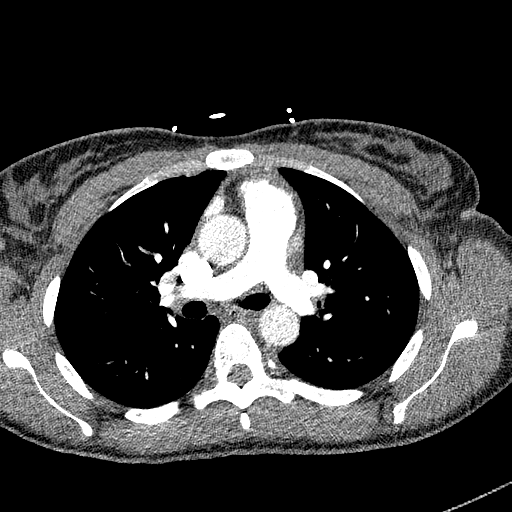
[im 183/265  lung]
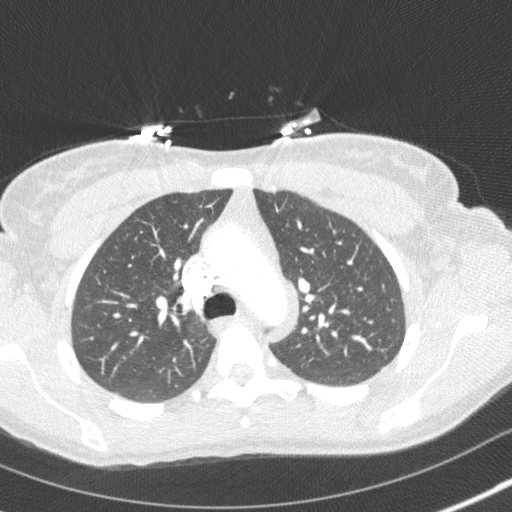
[im 204/265  mediastinal]
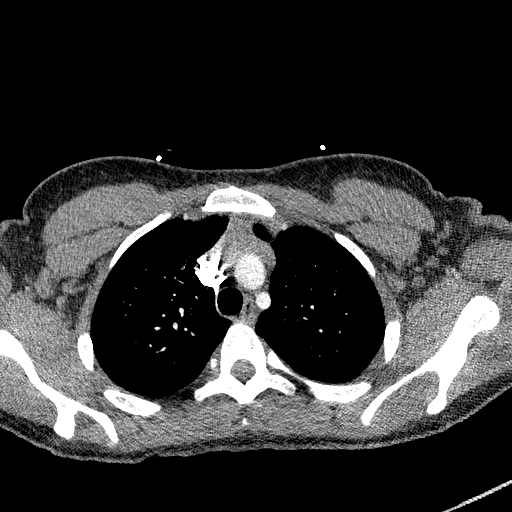
[im 224/265  lung]
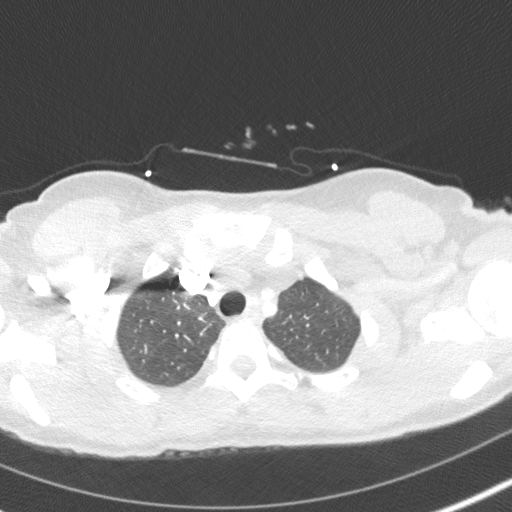
[im 244/265  mediastinal]
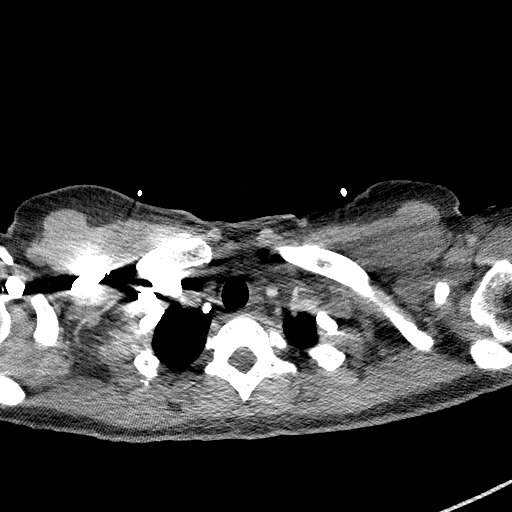

[Series 7: cor soft · coronal · 0.53mm/px · 1 of 110 slices shown]
[im 55/110  mediastinal]
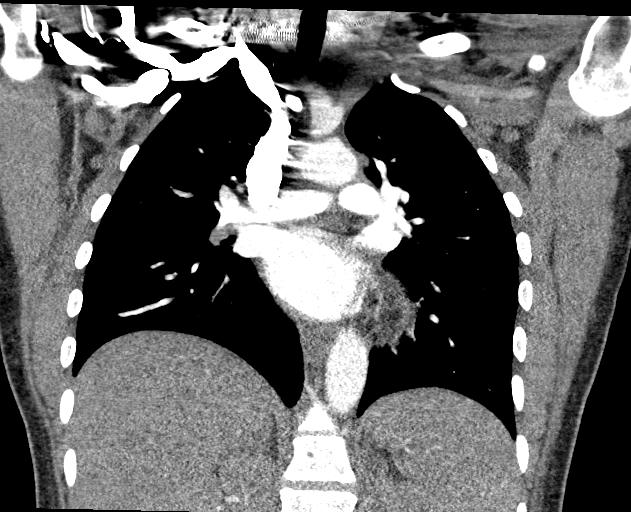

[13 of 36 positions shown; findings below may reference images not displayed]

FINDINGS: Cardiovascular: Satisfactory opacification of the pulmonary arteries
to the segmental level. No evidence of pulmonary embolism. Normal
heart size. No significant pericardial effusion. The thoracic aorta
is normal in caliber. No atherosclerotic plaque of the thoracic
aorta. No coronary artery calcifications. Poor evaluation of
possible venous variant ([DATE]).

Mediastinum/Nodes: Prominent and borderline enlarged up to 1.2 cm
bilateral axillary lymph nodes ([DATE], 70). Question bilateral
supraclavicular lymph nodes with markedly limited evaluation due to
streak artifact originating from external jewelry. No enlarged
mediastinal or hilarlymph nodes. Thyroid gland, trachea, and
esophagus demonstrate no significant findings.

Lungs/Pleura: Lungs are clear. No pleural effusion or pneumothorax.

Upper Abdomen: No acute abnormality.

Musculoskeletal: No chest wall abnormality. No acute or significant
osseous findings.

Review of the MIP images confirms the above findings.
IMPRESSION: Nonspecific bilateral borderline enlarged axillary lymph nodes.
Question bilateral supraclavicular lymph nodes with markedly limited
evaluation due to streak artifact originating from external jewelry.
Findings concerning for lymphoproliferative disorder.

## 2023-02-05 ENCOUNTER — Ambulatory Visit: Payer: Medicaid Other | Admitting: Family Medicine

## 2023-02-12 ENCOUNTER — Ambulatory Visit: Payer: Medicaid Other

## 2023-02-18 IMAGING — US US EXTREM UP *R* LTD
2 series · 14 of 25 positions shown · non-contrast
Comparison: CTA 09/27/2020

CLINICAL DATA: Adenopathy suspected on recent CTA

EXAM:
ULTRASOUND RIGHT UPPER EXTREMITY LIMITED
TECHNIQUE: Ultrasound examination of the upper extremity soft tissues was
performed in the area of clinical concern.

[Series 1: us extrem up *right* ltd · 11 of 49 slices shown (1 of 2)]
[im 1/49]
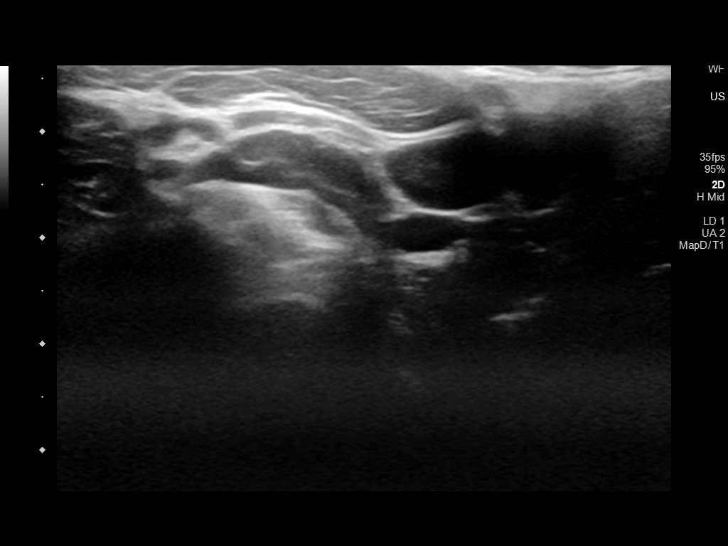
[im 6/49]
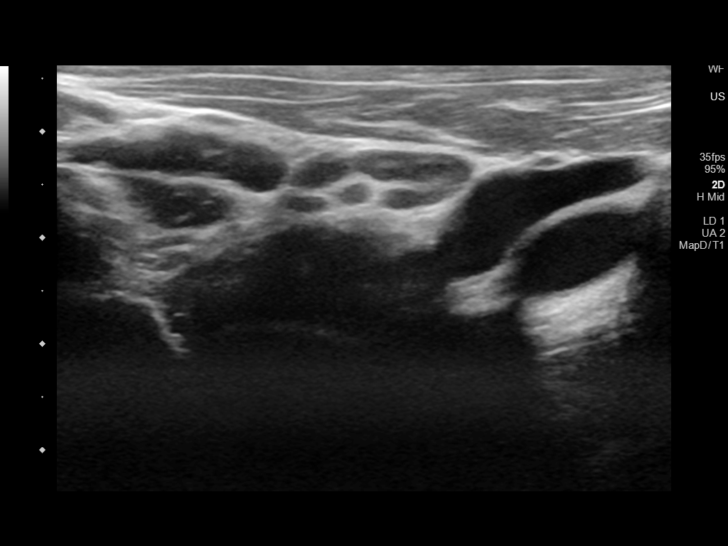
[im 11/49]
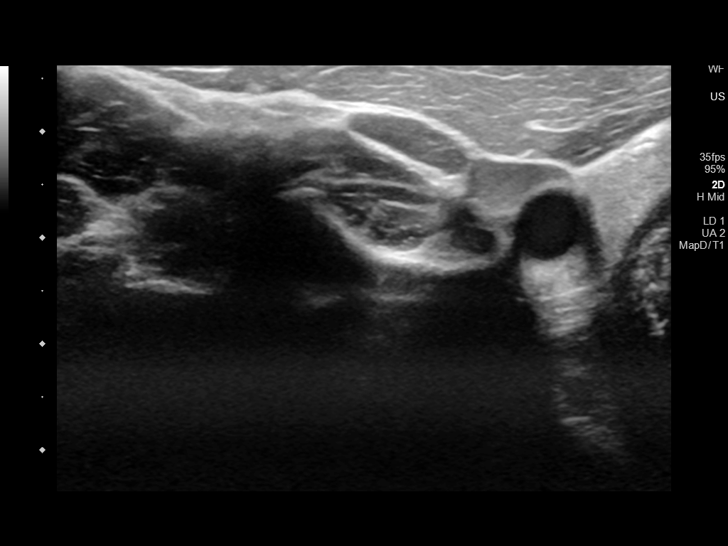
[im 16/49]
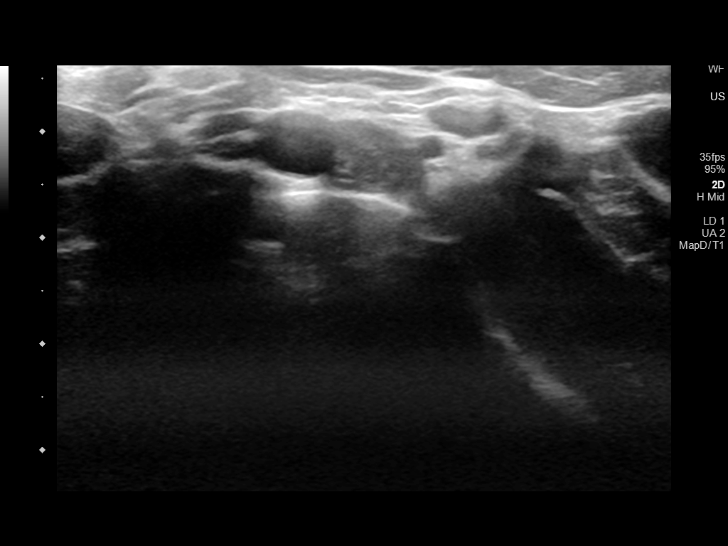
[im 21/49]
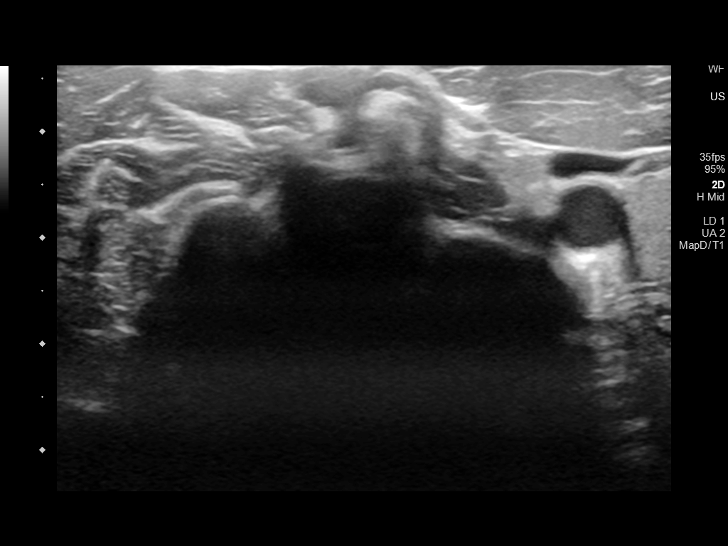
[im 23/49]
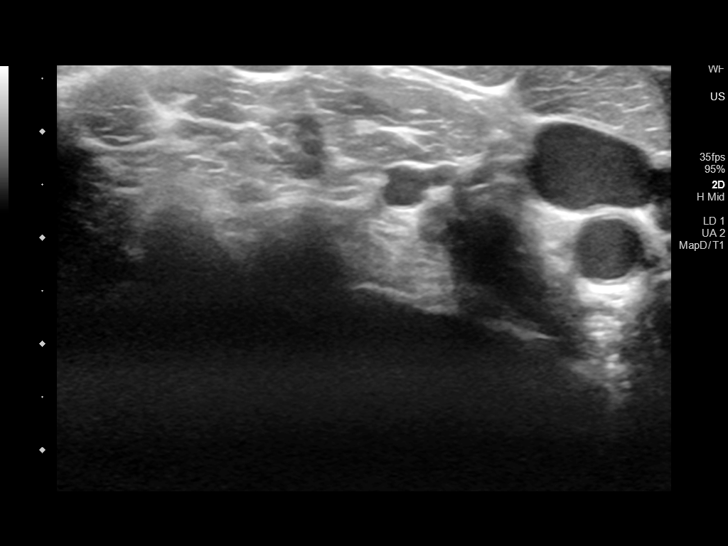
[im 28/49]
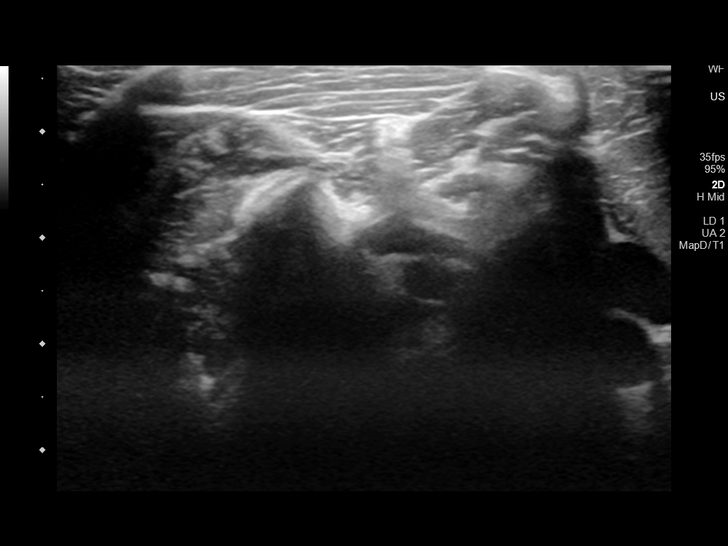
[im 33/49]
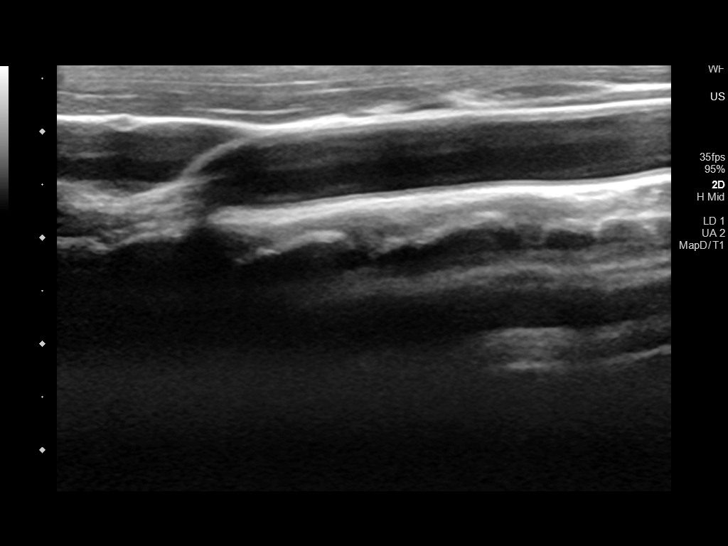
[im 38/49]
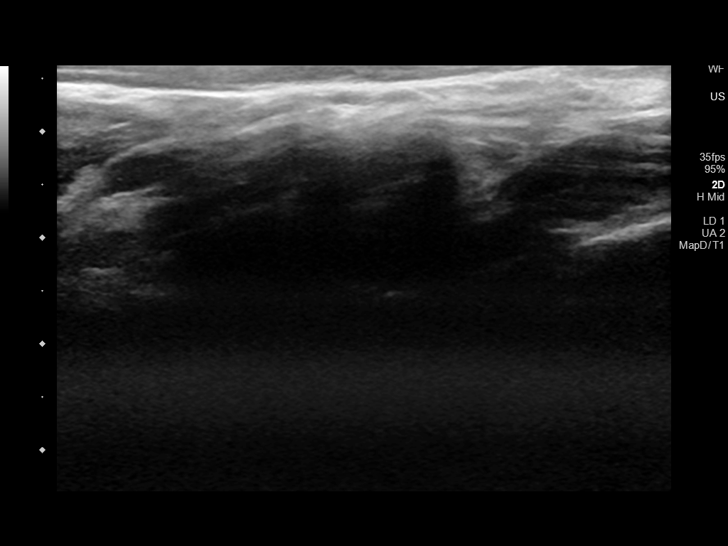
[im 41/49]
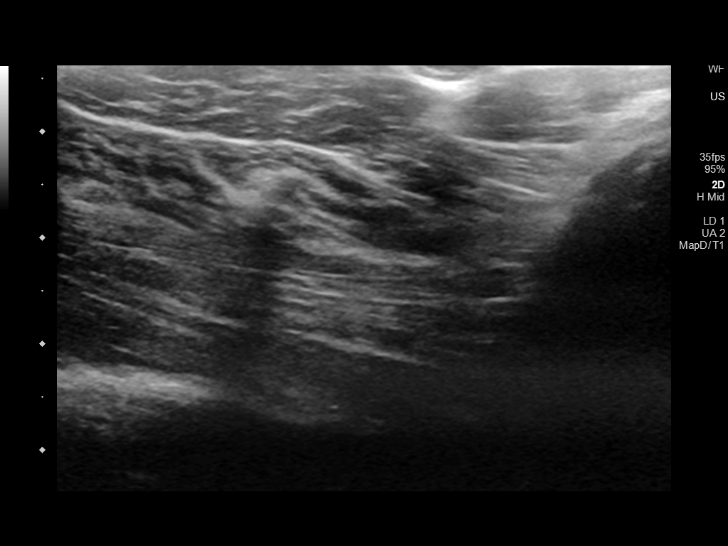
[im 46/49]
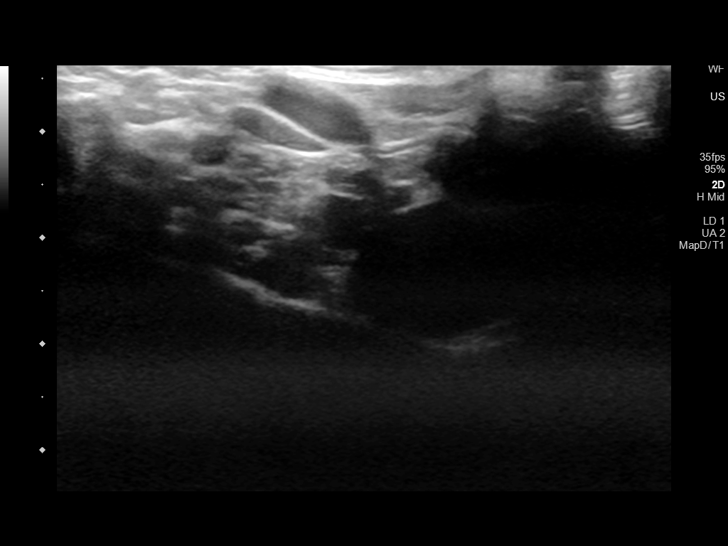

[Series 2: us extrem up *right* ltd · 3 of 12 slices shown (2 of 2)]
[im 1/12]
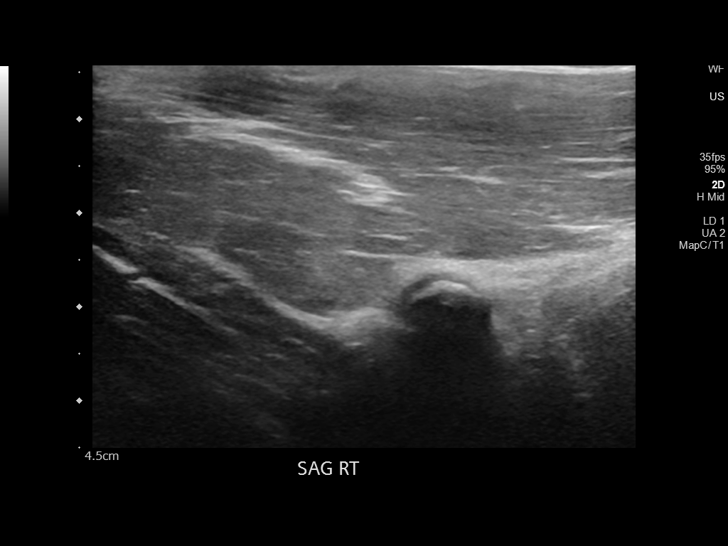
[im 6/12]
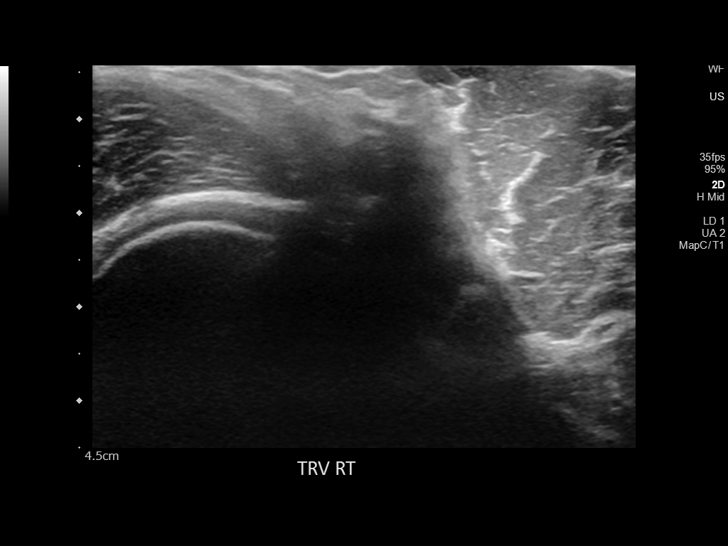
[im 12/12]
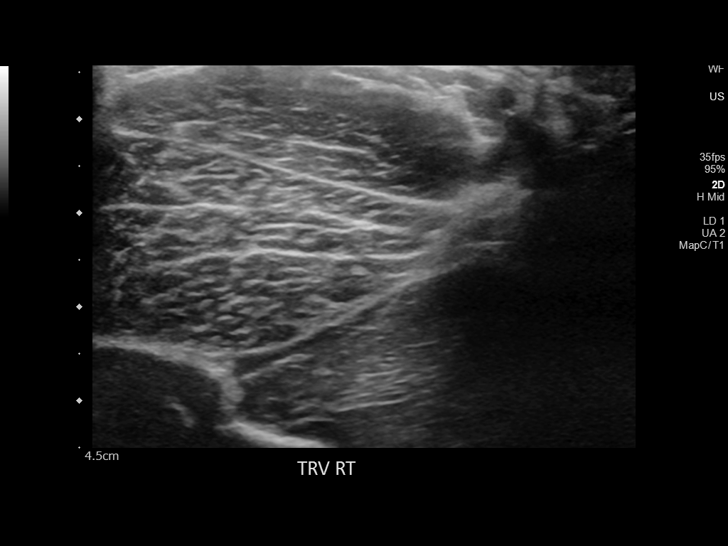

[14 of 25 positions shown; findings below may reference images not displayed]

FINDINGS: No axillary adenopathy localized.
IMPRESSION: Negative for right axillary adenopathy.

## 2023-03-13 ENCOUNTER — Ambulatory Visit: Payer: Medicaid Other

## 2023-04-22 NOTE — L&D Delivery Note (Addendum)
 LABOR COURSE Angela Sexton is a 33 y.o. female [phone] with IUP at [redacted]w[redacted]d by LMP presenting for IOL due to cholestasis. She came in 10/3 at 1/50/-3, ruptured at 20:33 the same day, and progressed to complete by 10/4. Baby was having prolonged decelerations, but [name] delivered a healthy baby girl at 9:05 AM and an intact placenta was spontaneously delivered after at 9:24 AM.   Delivery Note Called to room and patient was complete and pushing. Head delivered MOA. No nuchal cord present. Shoulder and body delivered in usual fashion. At 9:05 a healthy girl was delivered via Vaginal, Spontaneous (Presentation: MOA 8;9).  Infant with spontaneous cry, placed on mother's abdomen, dried and stimulated. Cord clamped x 2 after it stopped pulsating, and cut by Cordella, FOB. Cord blood drawn. Placenta delivered spontaneously with gentle cord traction. Appears intact. Fundus firm with massage and Pitocin . Labia, perineum, vagina, and cervix inspected manually and found to have no tears, superficial or otherwise.    APGAR: 8,9 Weight: pending.   Cord: 3VC with the following complications:None.   Cord pH: N/A  Anesthesia:  epidural Episiotomy: None Lacerations: none Suture Repair: N/A Est. Blood Loss (mL): 250  Mom to postpartum.  Baby to Couplet care / Skin to Skin.  Kirsteins, Aleksa, MD 01/23/24 9:31 AM    I was personally present and re-performed the exam and medical decision making and verified the service and findings are accurately documented in the resident's note.  Charlie DELENA Courts, MD 01/23/2024 10:04 AM

## 2023-04-23 ENCOUNTER — Encounter: Payer: Medicaid Other | Admitting: Family Medicine

## 2023-04-27 ENCOUNTER — Encounter: Payer: Self-pay | Admitting: Family Medicine

## 2023-05-07 ENCOUNTER — Encounter: Payer: Medicaid Other | Admitting: Family Medicine

## 2023-05-15 ENCOUNTER — Ambulatory Visit: Payer: Medicaid Other | Admitting: Family Medicine

## 2023-06-09 ENCOUNTER — Telehealth: Payer: Medicaid Other | Admitting: Physician Assistant

## 2023-06-09 DIAGNOSIS — S060X0A Concussion without loss of consciousness, initial encounter: Secondary | ICD-10-CM

## 2023-06-09 DIAGNOSIS — M62838 Other muscle spasm: Secondary | ICD-10-CM

## 2023-06-09 MED ORDER — MELOXICAM 15 MG PO TABS
15.0000 mg | ORAL_TABLET | Freq: Every day | ORAL | 0 refills | Status: DC
Start: 2023-06-09 — End: 2023-07-05

## 2023-06-09 MED ORDER — CYCLOBENZAPRINE HCL 10 MG PO TABS: 10.0000 mg | ORAL_TABLET | Freq: Three times a day (TID) | ORAL | 0 refills | Status: DC | PRN
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
## 2023-06-09 NOTE — Patient Instructions (Signed)
  Aziyah Dominy, thank you for joining [name], PA-C for today's virtual visit.  While this provider is not your primary care provider (PCP), if your PCP is located in our provider database this encounter information will be shared with them immediately following your visit.   A [facility] MyChart account gives you access to today's visit and all your visits, tests, and labs performed at [facility] " click here if you don't have a [facility] MyChart account or go to [email or web address]  Consent: (Patient) Angela Sexton provided verbal consent for this virtual visit at the beginning of the encounter.  Current Medications:  Current Outpatient Medications:    hydrOXYzine (ATARAX) 25 MG tablet, Take 25 mg by mouth 3 (three) times daily., Disp: , Rfl:    lamoTRIgine (LAMICTAL) 25 MG tablet, Take 50 mg by mouth daily., Disp: , Rfl:    norethindrone (MICRONOR) 0.35 MG tablet, Take 1 tablet (0.35 mg total) by mouth daily., Disp: 30 tablet, Rfl: 11   Medications ordered in this encounter:  No orders of the defined types were placed in this encounter.    *If you need refills on other medications prior to your next appointment, please contact your pharmacy*  Follow-Up: Call back or seek an in-person evaluation if the symptoms worsen or if the condition fails to improve as anticipated.  [facility] [phone]  Other Instructions Please avoid heavy lifting or overexertion. Gentle stretches recommended.  Heating pad in 15-minute intervals. Ok to use Tylenol OTC.  Take the prescribed medications as directed. If there is any new or worsening symptoms despite treatment, you need an in-person evaluation ASAP. DO NOT DELAY CARE.    If you have been instructed to have an in-person evaluation today at a local Urgent Care facility, please use the link below. It will take you to a list of all of our available [facility], including address,  phone number and hours of operation. Please do not delay care.  [facility]  If you or a family member do not have a primary care provider, use the link below to schedule a visit and establish care. When you choose a [facility] primary care physician or advanced practice provider, you gain a long-term partner in health. Find a Primary Care Provider  Learn more about [facility] in-office and virtual care options: [facility] - Get Care Now

## 2023-06-15 ENCOUNTER — Telehealth: Payer: Self-pay | Admitting: Family Medicine

## 2023-06-15 ENCOUNTER — Ambulatory Visit (INDEPENDENT_AMBULATORY_CARE_PROVIDER_SITE_OTHER): Payer: Medicaid Other

## 2023-06-15 DIAGNOSIS — Z3490 Encounter for supervision of normal pregnancy, unspecified, unspecified trimester: Secondary | ICD-10-CM

## 2023-06-15 DIAGNOSIS — Z3200 Encounter for pregnancy test, result unknown: Secondary | ICD-10-CM

## 2023-06-15 DIAGNOSIS — Z3201 Encounter for pregnancy test, result positive: Secondary | ICD-10-CM

## 2023-06-15 LAB — POCT PREGNANCY, URINE: Preg Test, Ur: POSITIVE — AB

## 2023-06-15 NOTE — Telephone Encounter (Signed)
 Please disregard error.

## 2023-06-17 NOTE — Progress Notes (Signed)
 Here to leave urine specimen for pregnancy confirmation. UPT positive. Patient contacted over MyChart.   Fleet Contras RN 06/15/23

## 2023-06-22 NOTE — Addendum Note (Signed)
 Addended by: Marjo Bicker on: 06/22/2023 09:52 AM   Modules accepted: Orders

## 2023-06-30 ENCOUNTER — Ambulatory Visit

## 2023-06-30 DIAGNOSIS — Z3491 Encounter for supervision of normal pregnancy, unspecified, first trimester: Secondary | ICD-10-CM

## 2023-06-30 DIAGNOSIS — Z3490 Encounter for supervision of normal pregnancy, unspecified, unspecified trimester: Secondary | ICD-10-CM

## 2023-06-30 DIAGNOSIS — Z3A01 Less than 8 weeks gestation of pregnancy: Secondary | ICD-10-CM

## 2023-07-05 ENCOUNTER — Encounter: Payer: Self-pay | Admitting: Certified Nurse Midwife

## 2023-07-12 ENCOUNTER — Encounter: Payer: Self-pay | Admitting: Certified Nurse Midwife

## 2023-07-21 ENCOUNTER — Telehealth: Payer: Medicaid Other

## 2023-07-21 DIAGNOSIS — Z3490 Encounter for supervision of normal pregnancy, unspecified, unspecified trimester: Secondary | ICD-10-CM | POA: Insufficient documentation

## 2023-07-21 DIAGNOSIS — Z3A10 10 weeks gestation of pregnancy: Secondary | ICD-10-CM

## 2023-07-21 DIAGNOSIS — Z8619 Personal history of other infectious and parasitic diseases: Secondary | ICD-10-CM | POA: Insufficient documentation

## 2023-07-21 DIAGNOSIS — Z3481 Encounter for supervision of other normal pregnancy, first trimester: Secondary | ICD-10-CM

## 2023-07-21 DIAGNOSIS — Z3480 Encounter for supervision of other normal pregnancy, unspecified trimester: Secondary | ICD-10-CM

## 2023-07-21 NOTE — Progress Notes (Signed)
 New OB Intake  I connected with Angela Sexton  on 07/21/23 at  3:15 PM EDT by MyChart Video Visit and verified that I am speaking with the correct person using two identifiers. Nurse is located at [facility] and pt is located at home.  I discussed the limitations, risks, security and privacy concerns of performing an evaluation and management service by telephone and the availability of in person appointments. I also discussed with the patient that there may be a patient responsible charge related to this service. The patient expressed understanding and agreed to proceed.  I explained I am completing New OB Intake today. We discussed EDD of 02/12/2024, by Last Menstrual Period. Pt is [id number]. I reviewed her allergies, medications and Medical/Surgical/OB history.    Patient Active Problem List   Diagnosis Date Noted   Supervision of other normal pregnancy, antepartum 07/21/2023   History of syphilis 07/21/2023    Concerns addressed today  Delivery Plans Plans to deliver at [facility]. Discussed the nature of our practice with multiple providers including residents and students. Due to the size of the practice, the delivering provider may not be the same as those providing prenatal care.   Patient is not interested in water birth. Offered upcoming OB visit with CNM to discuss further.  MyChart/Babyscripts MyChart access verified. I explained pt will have some visits in office and some virtually. Babyscripts instructions given and order placed. Patient verifies receipt of registration text/e-mail. Account successfully created and app downloaded. If patient is a candidate for Optimized scheduling, add to sticky note.   Blood Pressure Cuff/Weight Scale Blood pressure cuff ordered for patient to pick-up from [employer]. Explained after first prenatal appt pt will check weekly and document in Babyscripts. Patient does not have weight scale; order sent to [facility], patient may track weight  weekly in Babyscripts.  Anatomy US Will call MFM to get her anatomy ultrasound schedule.   Is patient a CenteringPregnancy candidate?  Declined Declined due to Schedule    Is patient a Mom+Baby Combined Care candidate?  Not a candidate   If accepted, confirm patient does not intend to move from the area for at least 12 months, then notify Mom+Baby staff  Interested in [place]? If yes, send referral and doula dot phrase.   Is patient a candidate for Babyscripts Optimization? Yes, patient declined   First visit review I reviewed new OB appt with patient. Explained pt will be seen by Bernerd Limbo CNM at first visit. Discussed Avelina Laine genetic screening with patient. Yes [facility] and [facility].. Routine prenatal labs is not   Last Pap Will collect pap at her first OB visit.   Angela Sexton, [place] 07/21/2023  3:34 PM

## 2023-07-21 NOTE — Progress Notes (Addendum)
 "  History:   Angela Sexton is a 33 y.o. [id number] at [redacted]w[redacted]d by LMP being seen today for her first obstetrical visit.  Her obstetrical history is significant for anemia, anxiety, depression, and 4th degree laceration. Patient does intend to breast feed, and plans to use OCPs for contraception. Pregnancy history fully reviewed.  Patient reports n/v, vaginal itching and rectal pressure.      HISTORY: OB History  Gravida Para Term Preterm AB Living  4 2 2  0 1 2  SAB IAB Ectopic Multiple Live Births  1 0 0 0 2    # Outcome Date GA Lbr Len/2nd Weight Sex Type Anes PTL Lv  4 Current           3 Term 08/23/21 [redacted]w[redacted]d / 00:14 2990 g F Vag-Spont None  LIV     Birth Comments: WNL     Name: Austell,GIRL Madelene     Apgar1: 8  Apgar5: 9  2 Term 2014 [redacted]w[redacted]d  4080 g F Vag-Spont   LIV     Complications: History of episiotomy  1 SAB             Last pap smear was done in 2019 and was normal  Past Medical History:  Diagnosis Date   Anemia    Anxiety    Axillary lymphadenopathy 10/09/2020   B12 deficiency 11/09/2020   Bipolar 1 disorder (HCC) 11/26/2022   Bronchogenic cyst 04/21/2014   Formatting of this note might be different from the original.  17mm right  Formatting of this note might be different from the original.  Overview:   17mm right     Formatting of this note might be different from the original.  17mm right     Depression    Eczema    Elevated sed rate 11/09/2020   Facial rash 12/07/2020   Family history of scleroderma 05/08/2021   Folate deficiency 11/09/2020   Hematuria with proteinuria 11/09/2020   Hx of maternal laceration, 4th degree, currently pregnant 06/05/2021   Iron deficiency anemia 11/09/2020   Joint pain 01/19/2021   Pneumonia    infant   Positive ANA (antinuclear antibody) 05/18/2021   Concern for Lupus   Elevated sed rate  Repeat ANA neg  dsDNA neg  SSA/SSB- Negative   Positive ANA (antinuclear antibody) 05/18/2021   Concern for Lupus   Elevated sed rate  Repeat  ANA neg  dsDNA neg  SSA/SSB- Negative     Raynaud phenomenon 02/05/2021   Syphilis 05/09/2021   05-08-21  Positive RPR with titer of 1:32--> treated with bicillin  x3   [x]  6 month titer: 1:32 (inappropriate drop) Retreated [name]  x 3  [x]  12 month titer: 1:8 (appropriate drop)  [ ]  18 month titer (12 months after second treatment)         Past Surgical History:  Procedure Laterality Date   WISDOM TOOTH EXTRACTION     Family History  Problem Relation Age of Onset   Asthma Mother    Eczema Mother    Scleroderma Father    Melanoma Maternal Grandfather    Lung disease Neg Hx    Colon cancer Neg Hx    Esophageal cancer Neg Hx    Social History   Tobacco Use   Smoking status: Never   Smokeless tobacco: Never  Vaping Use   Vaping status: Never Used  Substance Use Topics   Alcohol use: Yes    Comment: occ   Drug use: Never   Allergies  Allergen  Reactions   Sulfa Antibiotics     Nausea/vomiting, weakness   Current Outpatient Medications on File Prior to Visit  Medication Sig Dispense Refill   Acetaminophen -Caffeine  (EXCEDRIN TENSION HEADACHE PO) Take by mouth.     Prenatal Vit-Fe Fumarate-FA (MULTIVITAMIN-PRENATAL) 27-0.8 MG TABS tablet Take 1 tablet by mouth daily at 12 noon.     No current facility-administered medications on file prior to visit.    Review of Systems Pertinent items noted in HPI and remainder of comprehensive ROS otherwise negative. Physical Exam:   Vitals:   07/22/23 0926  BP: 118/80  Pulse: 88  Weight: 80 kg      Constitutional: Well-developed, well-nourished pregnant female in no acute distress.  HEENT: PERRLA Skin: normal color and turgor, no rash Cardiovascular: normal rate & rhythm, warm and well perfused Respiratory: normal effort, no problems with respiration noted GI: Abd soft, non-distended MS: Extremities nontender, no edema, normal ROM Neurologic: Alert and oriented x 4.  GU: no CVA tenderness Pelvic: NEFG, white discharge, no  blood, cervix clean. Pap/swabs collected today.  Assessment:    Pregnancy: [id number] Patient Active Problem List   Diagnosis Date Noted   Supervision of low-risk pregnancy 07/21/2023   History of syphilis 07/21/2023     Plan:    1. Encounter for supervision of low-risk pregnancy in first trimester (Primary) - CBC/D/Plt+RPR+Rh+ABO+RubIgG... - HgB A1c - PANORAMA PRENATAL TEST - Culture, OB Urine - Cytology - PAP( [place]) - US  OB Limited; Future - Rx for ASA 81 mg to start at 12 weeks  2. [redacted] weeks gestation of pregnancy - Routine OB care - US  MFM OB COMP + 14 WK; Future - Repeat water birth vs. epidural  3. History of migraine during pregnancy - Will monitor throughout pregnancy  4. Nausea and vomiting in pregnancy - Doxylamine -Pyridoxine  (DICLEGIS ) 10-10 MG TBEC; Take 1-2 tablets by mouth at bedtime.  Dispense: 60 tablet; Refill: 3  5. Vaginitis affecting pregnancy, antepartum - Cervicovaginal ancillary only collected  6. Initial obstetric visit in first trimester - Initial labs drawn. - Continue prenatal vitamins. - Problem list reviewed and updated. - Genetic Screening discussed, : ordered. - Ultrasound discussed; fetal anatomic survey: requested. - Anticipatory guidance about prenatal visits given including labs, ultrasounds, and testing. - Discussed usage of Babyscripts and virtual visits as additional source of managing and completing prenatal visits in midst of coronavirus and pandemic.   - Encouraged to complete MyChart Registration for her ability to review results, send requests, and have questions addressed.  - The nature of [facility] with multiple MDs and Advanced Practice Providers was explained to patient; also emphasized that residents, students are part of our team. - Routine obstetric precautions reviewed. Encouraged to seek out care at office or emergency room [facility] preferred) for urgent and/or  emergent concerns.  Return in about 4 weeks (around 08/19/2023) for IN-PERSON, LOB.    Future Appointments  Date Time Provider Department Center  08/19/2023  4:15 PM Vannie Cornell JONELLE EDDY [facility]  09/25/2023  8:00 AM [facility] NURSE INTAKE [facility]  09/30/2023  8:00 AM [facility] PROVIDER 1 [facility]  09/30/2023  8:30 AM [facility] US4 [facility]   Mitzie Molly, SNM   "

## 2023-07-22 ENCOUNTER — Other Ambulatory Visit (HOSPITAL_COMMUNITY)
Admission: RE | Admit: 2023-07-22 | Discharge: 2023-07-22 | Disposition: A | Discharge status: Home / Self Care | Source: Ambulatory Visit | Attending: Certified Nurse Midwife | Admitting: Certified Nurse Midwife

## 2023-07-22 ENCOUNTER — Other Ambulatory Visit: Payer: Self-pay

## 2023-07-22 ENCOUNTER — Ambulatory Visit: Admitting: Certified Nurse Midwife

## 2023-07-22 ENCOUNTER — Ambulatory Visit

## 2023-07-22 VITALS — BP 118/80 | HR 88 | Wt 176.3 lb

## 2023-07-22 DIAGNOSIS — O219 Vomiting of pregnancy, unspecified: Secondary | ICD-10-CM | POA: Insufficient documentation

## 2023-07-22 DIAGNOSIS — Z3A10 10 weeks gestation of pregnancy: Secondary | ICD-10-CM

## 2023-07-22 DIAGNOSIS — Z113 Encounter for screening for infections with a predominantly sexual mode of transmission: Secondary | ICD-10-CM | POA: Diagnosis not present

## 2023-07-22 DIAGNOSIS — Z8759 Personal history of other complications of pregnancy, childbirth and the puerperium: Secondary | ICD-10-CM

## 2023-07-22 DIAGNOSIS — Z3491 Encounter for supervision of normal pregnancy, unspecified, first trimester: Secondary | ICD-10-CM

## 2023-07-22 DIAGNOSIS — Z8619 Personal history of other infectious and parasitic diseases: Secondary | ICD-10-CM

## 2023-07-22 DIAGNOSIS — O23591 Infection of other part of genital tract in pregnancy, first trimester: Secondary | ICD-10-CM

## 2023-07-22 DIAGNOSIS — O23599 Infection of other part of genital tract in pregnancy, unspecified trimester: Secondary | ICD-10-CM

## 2023-07-22 DIAGNOSIS — B9689 Other specified bacterial agents as the cause of diseases classified elsewhere: Secondary | ICD-10-CM | POA: Insufficient documentation

## 2023-07-22 DIAGNOSIS — Z8669 Personal history of other diseases of the nervous system and sense organs: Secondary | ICD-10-CM | POA: Diagnosis not present

## 2023-07-22 MED ORDER — DOXYLAMINE-PYRIDOXINE 10-10 MG PO TBEC
1.0000 | DELAYED_RELEASE_TABLET | Freq: Every day | ORAL | 3 refills | Status: DC
Start: 2023-07-22 — End: 2023-09-24

## 2023-07-22 MED ORDER — ASPIRIN 81 MG PO TBEC: 81.0000 mg | DELAYED_RELEASE_TABLET | Freq: Every day | ORAL | 12 refills | Status: DC

## 2023-07-23 ENCOUNTER — Encounter: Payer: Self-pay | Admitting: *Deleted

## 2023-07-23 ENCOUNTER — Encounter: Payer: Self-pay | Admitting: Certified Nurse Midwife

## 2023-07-23 LAB — HCV INTERPRETATION

## 2023-07-23 LAB — CERVICOVAGINAL ANCILLARY ONLY
Bacterial Vaginitis (gardnerella): POSITIVE — AB
Candida Glabrata: NEGATIVE
Candida Vaginitis: NEGATIVE
Chlamydia: NEGATIVE
Comment: NEGATIVE
Comment: NEGATIVE
Comment: NEGATIVE
Comment: NEGATIVE
Comment: NEGATIVE
Comment: NORMAL
Neisseria Gonorrhea: NEGATIVE
Trichomonas: NEGATIVE

## 2023-07-23 LAB — CBC/D/PLT+RPR+RH+ABO+RUBIGG...
Antibody Screen: NEGATIVE
Basophils Absolute: 0 10*3/uL (ref 0.0–0.2)
Basos: 1 %
EOS (ABSOLUTE): 0.1 10*3/uL (ref 0.0–0.4)
Eos: 2 %
HCV Ab: NONREACTIVE
HIV Screen 4th Generation wRfx: NONREACTIVE
Hematocrit: 35.5 % (ref 34.0–46.6)
Hemoglobin: 11.9 g/dL (ref 11.1–15.9)
Hepatitis B Surface Ag: NEGATIVE
Immature Grans (Abs): 0 10*3/uL (ref 0.0–0.1)
Immature Granulocytes: 0 %
Lymphocytes Absolute: 2.7 10*3/uL (ref 0.7–3.1)
Lymphs: 39 %
MCH: 30.8 pg (ref 26.6–33.0)
MCHC: 33.5 g/dL (ref 31.5–35.7)
MCV: 92 fL (ref 79–97)
Monocytes Absolute: 0.5 10*3/uL (ref 0.1–0.9)
Monocytes: 8 %
Neutrophils Absolute: 3.5 10*3/uL (ref 1.4–7.0)
Neutrophils: 50 %
Platelets: 289 10*3/uL (ref 150–450)
RBC: 3.86 x10E6/uL (ref 3.77–5.28)
RDW: 12.7 % (ref 11.7–15.4)
RPR Ser Ql: REACTIVE — AB
Rh Factor: POSITIVE
Rubella Antibodies, IGG: 7.15 {index} (ref >0.99)
WBC: 6.8 10*3/uL (ref 3.4–10.8)

## 2023-07-23 LAB — HEMOGLOBIN A1C
Est. average glucose Bld gHb Est-mCnc: 94 mg/dL
Hgb A1c MFr Bld: 4.9 % (ref 4.8–5.6)

## 2023-07-23 LAB — CYTOLOGY - PAP
Comment: NEGATIVE
Diagnosis: NEGATIVE
High risk HPV: NEGATIVE

## 2023-07-23 LAB — RPR, QUANT+TP ABS (REFLEX)
Rapid Plasma Reagin, Quant: 1:8 {titer} — ABNORMAL HIGH
T Pallidum Abs: REACTIVE — AB

## 2023-07-23 MED ORDER — METRONIDAZOLE 0.75 % VA GEL
1.0000 | Freq: Every day | VAGINAL | 0 refills | Status: AC
Start: 2023-07-23 — End: 2023-07-30

## 2023-07-23 NOTE — Addendum Note (Signed)
 Addended by: Edd Arbour on: 07/23/2023 03:35 PM   Modules accepted: Orders

## 2023-07-24 ENCOUNTER — Encounter: Payer: Self-pay | Admitting: Certified Nurse Midwife

## 2023-07-24 LAB — URINE CULTURE, OB REFLEX: Organism ID, Bacteria: NO GROWTH

## 2023-07-24 LAB — CULTURE, OB URINE

## 2023-07-28 ENCOUNTER — Encounter: Payer: Self-pay | Admitting: Obstetrics and Gynecology

## 2023-07-28 ENCOUNTER — Encounter: Payer: Self-pay | Admitting: Certified Nurse Midwife

## 2023-07-29 LAB — PANORAMA PRENATAL TEST FULL PANEL:PANORAMA TEST PLUS 5 ADDITIONAL MICRODELETIONS: FETAL FRACTION: 12.1

## 2023-08-14 ENCOUNTER — Encounter: Payer: Self-pay | Admitting: Family Medicine

## 2023-08-19 ENCOUNTER — Other Ambulatory Visit: Payer: Self-pay

## 2023-08-19 ENCOUNTER — Ambulatory Visit: Admitting: Certified Nurse Midwife

## 2023-08-19 ENCOUNTER — Other Ambulatory Visit (HOSPITAL_COMMUNITY)
Admission: RE | Admit: 2023-08-19 | Discharge: 2023-08-19 | Disposition: A | Discharge status: Home / Self Care | Source: Ambulatory Visit | Attending: Certified Nurse Midwife | Admitting: Certified Nurse Midwife

## 2023-08-19 VITALS — BP 107/74 | HR 80 | Wt 177.0 lb

## 2023-08-19 DIAGNOSIS — J302 Other seasonal allergic rhinitis: Secondary | ICD-10-CM

## 2023-08-19 DIAGNOSIS — Z3492 Encounter for supervision of normal pregnancy, unspecified, second trimester: Secondary | ICD-10-CM | POA: Diagnosis not present

## 2023-08-19 DIAGNOSIS — N898 Other specified noninflammatory disorders of vagina: Secondary | ICD-10-CM | POA: Diagnosis present

## 2023-08-19 DIAGNOSIS — Z3A14 14 weeks gestation of pregnancy: Secondary | ICD-10-CM | POA: Diagnosis not present

## 2023-08-19 MED ORDER — GUAIFENESIN ER 600 MG PO TB12: 600.0000 mg | ORAL_TABLET | Freq: Two times a day (BID) | ORAL | 0 refills | Status: DC

## 2023-08-19 MED ORDER — FLUTICASONE PROPIONATE 50 MCG/ACT NA SUSP: 2.0000 | Freq: Every day | NASAL | 3 refills | Status: DC

## 2023-08-19 NOTE — Progress Notes (Signed)
   PRENATAL VISIT NOTE  Subjective:  Angela Sexton is a 33 y.o. [id number] at [redacted]w[redacted]d being seen today for ongoing prenatal care.  She is currently monitored for the following issues for this low-risk pregnancy and has Supervision of low-risk pregnancy and History of syphilis on their problem list.  Patient reports recovering from a cold, lingering congestion/cough. She also reports feeling like "something is hanging out" of her vagina intermittently x 1 week and irritation making intercourse uncomfortable.  Contractions: Not present. Vag. Bleeding: None.  Movement: Absent. Denies leaking of fluid.   The following portions of the patient's history were reviewed and updated as appropriate: allergies, current medications, past family history, past medical history, past social history, past surgical history and problem list.   Objective:   Vitals:   08/19/23 1605  BP: 107/74  Pulse: 80  Weight: 80.3 kg    Fetal Status: Fetal Heart Rate (bpm): 143   Movement: Absent     General:  Alert, oriented and cooperative. Patient is in no acute distress.  Skin: Skin is warm and dry. No rash noted.   Cardiovascular: Normal heart rate noted  Respiratory: Normal respiratory effort, no problems with respiration noted. No sinus tenderness. Slight tonsillar and submandibular adenopathy  Abdomen: Soft, gravid, appropriate for gestational age.  Pain/Pressure: Absent     Pelvic: Cervical exam deferred, Vulva appears normal, some white vaginal discharge present        Extremities: Normal range of motion.  Edema: None  Mental Status: Normal mood and affect. Normal behavior. Normal judgment and thought content.   Assessment and Plan:  Pregnancy: [id number] at [redacted]w[redacted]d 1. Encounter for supervision of low-risk pregnancy in second trimester (Primary) - Doing well overall. Nausea has completely resolved. Not experiencing issues with migraines.   2. [redacted] weeks gestation of pregnancy - Routine OB Care  3. Vaginal  irritation - New since completing antibiotics for BV, will swab for yeast. - Cervicovaginal ancillary only( Angela Sexton)  4. Acute seasonal allergic rhinitis - guaiFENesin (MUCINEX) 600 MG 12 hr tablet; Take 1 tablet (600 mg total) by mouth 2 (two) times daily.  Dispense: 30 tablet; Refill: 0 - fluticasone (FLONASE) 50 MCG/ACT nasal spray; Place 2 sprays into both nostrils daily.  Dispense: 32 mL; Refill: 3  Preterm labor symptoms and general obstetric precautions including but not limited to vaginal bleeding, contractions, leaking of fluid and fetal movement were reviewed in detail with the patient. Please refer to After Visit Summary for other counseling recommendations.   Return in about 4 weeks (around 09/16/2023) for IN-PERSON, LOB.  Future Appointments  Date Time Provider Department Center  09/16/2023  3:15 PM Cleophas Dadds [facility]  09/21/2023  3:15 PM [facility]ALTH CLINICIAN [facility]  09/30/2023  8:00 AM [facility] PROVIDER 1 [facility]  09/30/2023  8:30 AM [facility] US4 [facility]    Angela Sexton, Student-MidWife

## 2023-08-21 LAB — CERVICOVAGINAL ANCILLARY ONLY
Bacterial Vaginitis (gardnerella): POSITIVE — AB
Candida Glabrata: NEGATIVE
Candida Vaginitis: NEGATIVE
Chlamydia: NEGATIVE
Comment: NEGATIVE
Comment: NEGATIVE
Comment: NEGATIVE
Comment: NEGATIVE
Comment: NEGATIVE
Comment: NORMAL
Neisseria Gonorrhea: NEGATIVE
Trichomonas: NEGATIVE

## 2023-08-24 ENCOUNTER — Encounter: Payer: Self-pay | Admitting: Certified Nurse Midwife

## 2023-08-24 DIAGNOSIS — N760 Acute vaginitis: Secondary | ICD-10-CM

## 2023-08-24 MED ORDER — CLINDAMYCIN PHOSPHATE 2 % VA CREA: 1.0000 | TOPICAL_CREAM | Freq: Every day | VAGINAL | 0 refills | Status: DC

## 2023-08-29 ENCOUNTER — Other Ambulatory Visit: Payer: Self-pay | Admitting: Certified Nurse Midwife

## 2023-09-01 ENCOUNTER — Encounter: Payer: Self-pay | Admitting: Certified Nurse Midwife

## 2023-09-16 ENCOUNTER — Other Ambulatory Visit: Payer: Self-pay

## 2023-09-16 ENCOUNTER — Ambulatory Visit: Payer: Self-pay | Admitting: Certified Nurse Midwife

## 2023-09-16 VITALS — BP 114/72 | HR 92 | Wt 185.0 lb

## 2023-09-16 DIAGNOSIS — R059 Cough, unspecified: Secondary | ICD-10-CM | POA: Diagnosis not present

## 2023-09-16 DIAGNOSIS — Z3A18 18 weeks gestation of pregnancy: Secondary | ICD-10-CM

## 2023-09-16 DIAGNOSIS — Z3492 Encounter for supervision of normal pregnancy, unspecified, second trimester: Secondary | ICD-10-CM | POA: Diagnosis not present

## 2023-09-17 NOTE — BH Specialist Note (Signed)
 Integrated Behavioral Health via Telemedicine Visit  09/21/2023 Angela Sexton [id number]  Number of Integrated Behavioral Health Clinician visits: 1- Initial Visit  Session Start time: 1546   Session End time: 1623  Total time in minutes: 37   Referring Provider: Lemuel Quaker, CNM Patient/Family location: Home [facility] Provider location: [facility]  All persons participating in visit: Patient Angela Sexton and [facility] Angela Sexton   Types of Service: Individual psychotherapy and Video visit  I connected with Celina Decker and/or Averee Shawgo's n/a via  Telephone or Video Enabled Telemedicine Application  (Video is Caregility application) and verified that I am speaking with the correct person using two identifiers. Discussed confidentiality: Yes   I discussed the limitations of telemedicine and the availability of in person appointments.  Discussed there is a possibility of technology failure and discussed alternative modes of communication if that failure occurs.  I discussed that engaging in this telemedicine visit, they consent to the provision of behavioral healthcare and the services will be billed under their insurance.  Patient and/or legal guardian expressed understanding and consented to Telemedicine visit: Yes   Presenting Concerns: Patient and/or family reports the following symptoms/concerns: Increased hip pain in current pregnancy (not helping with new pregnancy pillow) making sleep more uncomfortable, so lack of quality sleep, depression, anxiety, irritability, fatigue, fidgety, worry and difficult relaxing. Pt was last treated with Vraylar at [facility], medication worked well, but prefers medication safe in pregnancy and referral to new psychiatry. Pt has taken Zoloft in the past without negative side effects. Pt is currently coping with lots of good support, eating well, and work break when school ends next week.   Duration of problem: Ongoing ; Severity of problem: severe  Patient and/or Family's Strengths/Protective Factors: Social connections, Concrete supports in place (healthy food, safe environments, etc.), Sense of purpose, and Physical Health (exercise, healthy diet, medication compliance, etc.)  Goals Addressed: Patient will:  Reduce symptoms of: anxiety and depression   Increase knowledge and/or ability of: healthy habits   Demonstrate ability to: Increase healthy adjustment to current life circumstances  Progress towards Goals: Ongoing  Interventions: Interventions utilized:  Solution-Focused Strategies and Psychoeducation and/or Health Education Standardized Assessments completed: GAD-7 and PHQ 9  Patient and/or Family Response: Patient agrees with treatment plan.   Assessment: Patient currently experiencing Bipolar affective disorder, recurrent, mixed episode  Patient may benefit from psychoeducation and brief therapeutic interventions regarding coping with symptoms of anxiety, depression .  Plan: Follow up with behavioral health clinician on : Three weeks; Call Sukhman Kocher at [phone], as needed. Behavioral recommendations:  -Discuss BH medication options at upcoming medical appointment -Accept referral to psychiatry [facility] for ongoing BH medication management -Continue prioritizing healthy self-care (regular meals, adequate rest; allowing practical help from supportive friends and family)  -Consider using local pool to help alleviate hip pain; discuss any limitations and/or other recommendations at upcoming medical appointment -Read through information on After Visit Summary; use as needed and discussed   Referral(s): Integrated Art gallery manager (In Clinic), Community Mental Health Services (LME/Outside Clinic), and Community Resources:  Local pool; Mother to Baby  I discussed the assessment and treatment plan with the patient and/or parent/guardian. They were  provided an opportunity to ask questions and all were answered. They agreed with the plan and demonstrated an understanding of the instructions.   They were advised to call back or seek an in-person evaluation if the symptoms worsen or if the condition fails to improve  as anticipated.  Elfredia Grippe Carren Blakley, LCSW     09/21/2023    4:05 PM 07/22/2023    5:00 PM 10/09/2022   11:06 AM 08/07/2022    3:32 PM 10/02/2021   10:04 AM  Depression screen PHQ 2/9  Decreased Interest 1 1 2  0 3  Down, Depressed, Hopeless 2 2 2  0 1  PHQ - 2 Score 3 3 4  0 4  Altered sleeping 3 2 2   0  Tired, decreased energy 3 3 2  3   Change in appetite 0 1 1  3   Feeling bad or failure about yourself  1 3 2  3   Trouble concentrating 1 2 2  3   Moving slowly or fidgety/restless 3 1 2  1   Suicidal thoughts 0 1 1  1   PHQ-9 Score 14 16 16  18       09/21/2023    4:08 PM 10/09/2022   11:06 AM 10/02/2021   10:07 AM 06/05/2021   10:09 AM  GAD 7 : Generalized Anxiety Score  Nervous, Anxious, on Edge 3 2 3 2   Control/stop worrying 3 2 3 3   Worry too much - different things 3 1 3 3   Trouble relaxing 3 3 0 1  Restless 1 2 0 1  Easily annoyed or irritable 3 3 3 3   Afraid - awful might happen 1 2 3 1   Total GAD 7 Score 17 15 15  14

## 2023-09-21 ENCOUNTER — Ambulatory Visit: Admitting: Clinical

## 2023-09-21 DIAGNOSIS — F3160 Bipolar disorder, current episode mixed, unspecified: Secondary | ICD-10-CM | POA: Diagnosis not present

## 2023-09-21 NOTE — Patient Instructions (Signed)
 Center for [facility] at [facility] [address] [place], [place] [address] [phone] (main office) [phone] [facility] office)  [email or web address]  Mother to Baby [email or web address]    BRAINSTORMING  Develop a Plan Goals: Provide a way to start conversation about your new life with a baby Assist parents in recognizing and using resources within their reach Help pave the way before birth for an easier period of transition afterwards.  Make a list of the following information to keep in a central location: Full name of Mom and Partner: _____________________________________________ Baby's full name and Date of Birth: ___________________________________________ Home Address: ___________________________________________________________ ________________________________________________________________________ Home Phone: ____________________________________________________________ Parents' cell numbers: _____________________________________________________ ________________________________________________________________________ Name and contact info for OB: ______________________________________________ Name and contact info for Pediatrician:________________________________________ Contact info for Lactation Consultants: ________________________________________  REST and SLEEP *You each need at least 4-5 hours of uninterrupted sleep every day. Write specific names and contact information.* How are you going to rest in the postpartum period? While partner's home? When partner returns to work? When you both return to work? Where will your baby sleep? Who is available to help during the day? Evening? Night? Who could move in for a period to help support you? What are some ideas to help you get enough  sleep? __________________________________________________________________________________________________________________________________________________________________________________________________________________________________________ NUTRITIOUS FOOD AND DRINK *Plan for meals before your baby is born so you can have healthy food to eat during the immediate postpartum period.* Who will look after breakfast? Lunch? Dinner? List names and contact information. Brainstorm quick, healthy ideas for each meal. What can you do before baby is born to prepare meals for the postpartum period? How can others help you with meals? Which grocery stores provide online shopping and delivery? Which restaurants offer take-out or delivery options? ______________________________________________________________________________________________________________________________________________________________________________________________________________________________________________________________________________________________________________________________________________________________________________________________________  CARE FOR MOM *It's important that mom is cared for and pampered in the postpartum period. Remember, the most important ways new mothers need care are: sleep, nutrition, gentle exercise, and time off.* Who can come take care of mom during this period? Make a list of people with their contact information. List some activities that make you feel cared for, rested, and energized? Who can make sure you have opportunities to do these things? Does mom have a space of her very own within your home that's just for her? Make a "[facility]" where she can be comfortable, rest, and renew herself  daily. ______________________________________________________________________________________________________________________________________________________________________________________________________________________________________________________________________________________________________________________________________________________________________________________________________    CARE FOR AND FEEDING BABY *Knowledgeable and encouraging people will offer the best support with regard to feeding your baby.* Educate yourself and choose the best feeding option for your baby. Make a list of people who will guide, support, and be a resource for you as your care for and feed your baby. (Friends that have breastfed or are currently breastfeeding, lactation consultants, breastfeeding support groups, etc.) Consider a postpartum doula. (These websites can give you information: dona.org & https://shea.org/) Seek out local breastfeeding resources like the breastfeeding support group at [employer] or [employer]. ______________________________________________________________________________________________________________________________________________________________________________________________________________________________________________________________________________________________________________________________________________________________________________________________________  Angela Sexton AND ERRANDS Who can help with a thorough cleaning before baby is born? Make a list of people who will help with housekeeping and chores, like laundry, light cleaning, dishes, bathrooms, etc. Who can run some errands for you? What can you do to make sure you are stocked with basic supplies before baby is born? Who is going to do the  shopping? ______________________________________________________________________________________________________________________________________________________________________________________________________________________________________________________________________________________________________________________________________________________________________________________________________     Family Adjustment *Nurture yourselves.it helps parents be more loving and allows for better bonding with their child.* What sorts of  things do you and partner enjoy doing together? Which activities help you to connect and strengthen your relationship? Make a list of those things. Make a list of people whom you trust to care for your baby so you can have some time together as a couple. What types of things help partner feel connected to Mom? Make a list. What needs will partner have in order to bond with baby? Other children? Who will care for them when you go into labor and while you are in the hospital? Think about what the needs of your older children might be. Who can help you meet those needs? In what ways are you helping them prepare for bringing baby home? List some specific strategies you have for family adjustment. _______________________________________________________________________________________________________________________________________________________________________________________________________________________________________________________________________________________________________________________________________________  SUPPORT *Someone who can empathize with experiences normalizes your problems and makes them more bearable.* Make a list of other friends, neighbors, and/or co-workers you know with infants (and small children, if applicable) with whom you can connect. Make a list of local or online support groups, mom groups, etc. in which you can be  involved. ______________________________________________________________________________________________________________________________________________________________________________________________________________________________________________________________________________________________________________________________________________________________________________________________________  Childcare Plans Investigate and plan for childcare if mom is returning to work. Talk about mom's concerns about her transition back to work. Talk about partner's concerns regarding this transition.  Mental Health *Your mental health is one of the highest priorities for a pregnant or postpartum mom.* 1 in 5 women experience anxiety and/or depression from the time of conception through the first year after birth. Postpartum Mood Disorders are the #1 complication of pregnancy and childbirth and the suffering experienced by these mothers is not necessary! These illnesses are temporary and respond well to treatment, which often includes self-care, social support, talk therapy, and medication when needed. Women experiencing anxiety and depression often say things like: "I'm supposed to be happy.why do I feel so sad?", "Why can't I snap out of it?", "I'm having thoughts that scare me." There is no need to be embarrassed if you are feeling these symptoms: Overwhelmed, anxious, angry, sad, guilty, irritable, hopeless, exhausted but can't sleep You are NOT alone. You are NOT to blame. With help, you WILL be well. Where can I find help? Medical professionals such as your OB, midwife, gynecologist, family practitioner, primary care provider, pediatrician, or mental health providers; [facility] support groups: Feelings After Birth, Breastfeeding Support Group, Baby and Me Group, and Fit 4 Two exercise classes. You have permission to ask for help. It will confirm your feelings, validate your experiences,  share/learn coping strategies, and gain support and encouragement as you heal. You are important! BRAINSTORM Make a list of local resources, including resources for mom and for partner. Identify support groups. Identify people to call late at night - include names and contact info. Talk with partner about perinatal mood and anxiety disorders. Talk with your OB, midwife, and doula about baby blues and about perinatal mood and anxiety disorders. Talk with your pediatrician about perinatal mood and anxiety disorders.   Support & Sanity Savers   What do you really need?  Basics In preparing for a new baby, many expectant parents spend hours shopping for baby clothes, decorating the nursery, and deciding which car seat to buy. Yet most don't think much about what the reality of parenting a newborn will be like, and what they need to make it through that. So, here is the advice of experienced parents. We know you'll read this, and think "they're exaggerating, I don't really need that." Just trust us  on  these, OK? Plan for all of this, and if it turns out you don't need it, come back and teach us  how you did it!  Must-Haves (Once baby's survival needs are met, make sure you attend to your own survival needs!) Sleep An average newborn sleeps 16-18 hours per day, over 6-7 sleep periods, rarely more than three hours at a time. It is normal and healthy for a newborn to wake throughout the night... but really hard on parents!! Naps. Prioritize sleep above any responsibilities like: cleaning house, visiting friends, running errands, etc.  Sleep whenever baby sleeps. If you can't nap, at least have restful times when baby eats. The more rest you get, the more patient you will be, the more emotionally stable, and better at solving problems.  Food You may not have realized it would be difficult to eat when you have a newborn. Yet, when we talk to countless new parents, they say things like "it may be 2:00 pm  when I realize I haven't had breakfast yet." Or "every time we sit down to dinner, baby needs to eat, and my food gets cold, so I don't bother to eat it." Finger food. Before your baby is born, stock up with one months' worth of food that: 1) you can eat with one hand while holding a baby, 2) doesn't need to be prepped, 3) is good hot or cold, 4) doesn't spoil when left out for a few hours, and 5) you like to eat. Think about: nuts, dried fruit, Clif bars, pretzels, jerky, gogurt, baby carrots, apples, bananas, crackers, cheez-n-crackers, string cheese, hot pockets or frozen burritos to microwave, garden burgers and breakfast pastries to put in the toaster, yogurt drinks, etc. Restaurant Menus. Make lists of your favorite restaurants & menu items. When family/friends want to help, you can give specific information without much thought. They can either bring you the food or send gift cards for just the right meals. Freezer Meals.  Take some time to make a few meals to put in the freezer ahead of time.  Easy to freeze meals can be anything such as soup, lasagna, chicken pie, or spaghetti sauce. Set up a Meal Schedule.  Ask friends and family to sign up to bring you meals during the first few weeks of being home. (It can be passed around at baby showers!) You have no idea how helpful this will be until you are in the throes of parenting.  Machine[employer].it is a great website to check out. Emotional Support Know who to call when you're stressed out. Parenting a newborn is very challenging work. There are times when it totally overwhelms your normal coping abilities. EVERY NEW PARENT NEEDS TO HAVE A PLAN FOR WHO TO CALL WHEN THEY JUST CAN'T COPE ANY MORE. (And it has to be someone other than the baby's other parent!) Before your baby is born, come up with at least one person you can call for support - write their phone number down and post it on the refrigerator. Anxiety & Sadness. Baby blues are normal after  pregnancy; however, there are more severe types of anxiety & sadness which can occur and should not be ignored.  They are always treatable, but you have to take the first step by reaching out for help. [facility] offers a "Mom Talk" group which meets every Tuesday from 10 am - 11 am.  This group is for new moms who need support and connection after their babies are born.  Call [phone].  Really, Really Helpful (Plan for them! Make sure these happen often!!) Physical Support with Taking Care of Yourselves Asking friends and family. Before your baby is born, set up a schedule of people who can come and visit and help out (or ask a friend to schedule for you). Any time someone says "let me know what I can do to help," sign them up for a day. When they get there, their job is not to take care of the baby (that's your job and your joy). Their job is to take care of you!  Postpartum doulas. If you don't have anyone you can call on for support, look into postpartum doulas:  professionals at helping parents with caring for baby, caring for themselves, getting breastfeeding started, and helping with household tasks. www.padanc.org is a helpful website for learning about doulas in our area. Peer Support / Parent Groups Why: One of the greatest ideas for new parents is to be around other new parents. Parent groups give you a chance to share and listen to others who are going through the same season of life, get a sense of what is normal infant development by watching several babies learn and grow, share your stories of triumph and struggles with empathetic ears, and forgive your own mistakes when you realize all parents are learning by trial and error. Where to find: There are many places you can meet other new parents throughout our community.  [facility] offers the following classes for new moms and their little ones:  Baby and Me (Birth to Crawling) and Breastfeeding Support Group. Go to  [email or web address] or call [phone] for more information. Time for your Relationship It's easy to get so caught up in meeting baby's immediate needs that it's hard to find time to connect with your partner, and meet the needs of your relationship. It's also easy to forget what "quality time with your partner" actually looks like. If you take your baby on a date, you'd be amazed how much of your couple time is spent feeding the baby, diapering the baby, admiring the baby, and talking about the baby. Dating: Try to take time for just the two of you. Babysitter tip: Sometimes when moms are breastfeeding a newborn, they find it hard to figure out how to schedule outings around baby's unpredictable feeding schedules. Have the babysitter come for a three hour period. When she comes over, if baby has just eaten, you can leave right away, and come back in two hours. If baby hasn't fed recently, you start the date at home. Once baby gets hungry and gets a good feeding in, you can head out for the rest of your date time. Date Nights at Home: If you can't get out, at least set aside one evening a week to prioritize your relationship: whenever baby dozes off or doesn't have any immediate needs, spend a little time focusing on each other. Potential conflicts: The main relationship conflicts that come up for new parents are: issues related to sexuality, financial stresses, a feeling of an unfair division of household tasks, and conflicts in parenting styles. The more you can work on these issues before baby arrives, the better!  Fun and Frills (Don't forget these. and don't feel guilty for indulging in them!) Everyone has something in life that is a fun little treat that they do just for themselves. It may be: reading the morning paper, or going for a daily jog, or having coffee with a friend once a week, or going  to a movie on Friday nights, or fine chocolates, or bubble baths, or curling up with a good  book. Unless you do fun things for yourself every now and then, it's hard to have the energy for fun with your baby. Whatever your "special" treats are, make sure you find a way to continue to indulge in them after your baby is born. These special moments can recharge you, and allow you to return to baby with a new joy   PERINATAL MOOD DISORDERS: MATERNAL MENTAL HEALTH FROM CONCEPTION THROUGH THE POSTPARTUM PERIOD   _________________________________________Emergency and Crisis Resources If you are an imminent risk to self or others, are experiencing intense personal distress, and/or have noticed significant changes in activities of daily living, call:  [facility]: [phone]  [address] [place], [place], [address] Mobile Crisis: [phone] National Suicide Hotline: 3 Or visit the following crisis centers: Local Emergency Departments [facility]:  [address], [place]  Mon-Friday 8am-3pm, [phone]                                                                                  ___________ Non-Crisis Resources To identify specific providers that are covered by your insurance, contact your insurance company or local agencies:   Postpartum Support International- Warm-line: 234-755-2639                                                      __Outpatient Therapy and Medication Management   Providers:  [facility]: [phone] Hours: 9AM-5PM  Insurance Accepted: Babetta Lesch, BCBS, Karsten Page, [place], Medicare  [facility]): [phone] Hours: 8AM-5:30PM  nsurance Accepted: All insurances EXCEPT AARP, [place], [place], and [employer] of the [place]: [phone] Hours: 8AM-8PM Insurance Accepted: Tiffany Foerster, Karsten Page, [place], Medicare, [phone] [facility]: [phone] Hours: 8:30AM-7PM Insurance Accepted: Tiffany Foerster, Medicaid, Medicare, Tricare, [employer] [facility]:  [phone]   Hours:9AM-5PM Insurance Accepted:  Raina Bunting, Mabeline Savant, [employer], [place], [employer] [facility]  [facility]: [phone] Hours: 9AM-5:30PM Insurance Accepted: Babetta Lesch, Jaylene Metz, and Medicaid, Medicare, [facility]:  [phone] Hours: 9am-5pm Insurance Accepted: BCBS; they do not accept Medicaid/Medicare [facility]: [phone] Hours: 9am-9pm Insurance Accepted: All major insurance including Medicaid and Medicare [facility]: [phone] Hours: 9AM- 5PM Insurance Accepted: All insurances EXCEPT Medicaid and Medicare. [facility]: [phone]   ____________                                                                     Parenting Support Groups [facility] :  [address] [place], [place],  [address] [phone] [facility]:  [phone] Family Support Network: (support for children in the NICU and/or with special needs), [phone]     _____________                                                                                  Online Resources Postpartum Support International: SeekAlumni.co.za  800-944-4PPD Supporting Moms:  www.momssupportingmoms.net

## 2023-09-23 ENCOUNTER — Telehealth (HOSPITAL_COMMUNITY): Payer: Self-pay

## 2023-09-23 NOTE — Telephone Encounter (Signed)
 When calling PT to get scheduled based off of referral in system - PT was not happy when given the first available appointment date - I explained to the PT that all of our providers are fully booked. PT requested a African American Female Therapist - I offered to give her other resources she declined those - PT was also interested in scheduling MM appointment - and was not happy with the appointment date.

## 2023-09-24 MED ORDER — ALBUTEROL SULFATE HFA 108 (90 BASE) MCG/ACT IN AERS: 2.0000 | INHALATION_SPRAY | Freq: Four times a day (QID) | RESPIRATORY_TRACT | 2 refills | Status: DC | PRN

## 2023-09-24 NOTE — Progress Notes (Signed)
   PRENATAL VISIT NOTE  Subjective:  Angela Sexton is a 33 y.o. [id number] at [redacted]w[redacted]d being seen today for ongoing prenatal care.  She is currently monitored for the following issues for this low-risk pregnancy and has Supervision of low-risk pregnancy and History of syphilis on their problem list.  Patient reports has had a persistent cough for the past few months, mucinex  helped some - has had asthma in the past.  Contractions: Not present. Vag. Bleeding: None.  Movement: Present. Denies leaking of fluid.   The following portions of the patient's history were reviewed and updated as appropriate: allergies, current medications, past family history, past medical history, past social history, past surgical history and problem list.   Objective:   Vitals:   09/16/23 1524  BP: 114/72  Pulse: 92  Weight: 185 lb (83.9 kg)   Fetal Status:  Fetal Heart Rate (bpm): 140   Movement: Present    General: Alert, oriented and cooperative. Patient is in no acute distress.  Skin: Skin is warm and dry. No rash noted.   Cardiovascular: Normal heart rate noted  Respiratory: Normal respiratory effort, no problems with respiration noted  Abdomen: Soft, gravid, appropriate for gestational age.  Pain/Pressure: Absent     Pelvic:         Extremities: Normal range of motion.  Edema: Trace (bilateral feet and ankles)  Mental Status: Normal mood and affect. Normal behavior. Normal judgment and thought content.   Assessment and Plan:  Pregnancy: [id number] at [redacted]w[redacted]d 1. Encounter for supervision of low-risk pregnancy in second trimester (Primary) - Doing well, feeling regular and vigorous fetal movement   2. [redacted] weeks gestation of pregnancy - Routine OB care   3. Persistent cough - Albuterol inhaler sent  Preterm labor symptoms and general obstetric precautions including but not limited to vaginal bleeding, contractions, leaking of fluid and fetal movement were reviewed in detail with the patient. Please refer to  After Visit Summary for other counseling recommendations.   Return in about 4 weeks (around 10/14/2023) for IN-PERSON, LOB.  Future Appointments  Date Time Provider Department Center  09/30/2023  8:00 AM [facility] PROVIDER 1 [facility]  09/30/2023  8:30 AM [facility] US4 [facility]  10/13/2023 10:45 AM [facility] CLINICIAN [facility]  10/14/2023  3:55 PM Derick Fleeting, CNM [facility]  11/20/2023 10:00 AM Coni Deep, [facility] None    Derick Fleeting, [place]

## 2023-09-25 ENCOUNTER — Ambulatory Visit

## 2023-09-30 ENCOUNTER — Ambulatory Visit: Attending: Certified Nurse Midwife

## 2023-09-30 ENCOUNTER — Ambulatory Visit (HOSPITAL_BASED_OUTPATIENT_CLINIC_OR_DEPARTMENT_OTHER): Admitting: Obstetrics

## 2023-09-30 VITALS — BP 109/58 | HR 87

## 2023-09-30 DIAGNOSIS — Z8619 Personal history of other infectious and parasitic diseases: Secondary | ICD-10-CM

## 2023-09-30 DIAGNOSIS — Z363 Encounter for antenatal screening for malformations: Secondary | ICD-10-CM

## 2023-09-30 DIAGNOSIS — Z3492 Encounter for supervision of normal pregnancy, unspecified, second trimester: Secondary | ICD-10-CM | POA: Diagnosis not present

## 2023-09-30 DIAGNOSIS — Z3A20 20 weeks gestation of pregnancy: Secondary | ICD-10-CM | POA: Insufficient documentation

## 2023-09-30 DIAGNOSIS — O358XX0 Maternal care for other (suspected) fetal abnormality and damage, not applicable or unspecified: Secondary | ICD-10-CM

## 2023-09-30 DIAGNOSIS — Z3689 Encounter for other specified antenatal screening: Secondary | ICD-10-CM

## 2023-09-30 DIAGNOSIS — Z3A10 10 weeks gestation of pregnancy: Secondary | ICD-10-CM

## 2023-09-30 NOTE — Progress Notes (Signed)
 MFM Consult Note  Angela Sexton is currently at 20 weeks and 5 days.  Angela Sexton was seen for a detailed fetal anatomy scan.  Angela Sexton denies any significant past medical history and denies any problems in her current pregnancy.    Angela Sexton had a cell free DNA test earlier in her pregnancy which indicated a low risk for trisomy 36, 63, and 13. A female fetus is predicted.   Angela Sexton was informed that the fetal growth and amniotic fluid level were appropriate for her gestational age.   There were no obvious fetal anomalies noted on today's ultrasound exam.  The patient was informed that anomalies may be missed due to technical limitations. If the fetus is in a suboptimal position or maternal habitus is increased, visualization of the fetus in the maternal uterus may be impaired.  Follow up as indicated.  The patient stated that all of her questions were answered today.  A total of 30 minutes was spent counseling and coordinating the care for this patient.  Greater than 50% of the time was spent in direct face-to-face contact.

## 2023-10-13 ENCOUNTER — Ambulatory Visit: Admitting: Clinical

## 2023-10-13 DIAGNOSIS — Z91199 Patient's noncompliance with other medical treatment and regimen due to unspecified reason: Secondary | ICD-10-CM

## 2023-10-13 NOTE — BH Specialist Note (Signed)
 Pt did not arrive to video visit and did not answer the phone; Left HIPPA-compliant message to call back Carolyn Cisco from [employer] for [employer] at [facility] at  [phone] [facility] office).  ?; left MyChart message for patient.  ? ?

## 2023-10-14 ENCOUNTER — Other Ambulatory Visit: Payer: Self-pay

## 2023-10-14 ENCOUNTER — Encounter: Payer: Self-pay | Admitting: Certified Nurse Midwife

## 2023-10-14 ENCOUNTER — Ambulatory Visit (INDEPENDENT_AMBULATORY_CARE_PROVIDER_SITE_OTHER): Admitting: Certified Nurse Midwife

## 2023-10-14 VITALS — BP 114/64 | HR 84 | Wt 195.2 lb

## 2023-10-14 DIAGNOSIS — Z3490 Encounter for supervision of normal pregnancy, unspecified, unspecified trimester: Secondary | ICD-10-CM

## 2023-10-14 DIAGNOSIS — Z3A22 22 weeks gestation of pregnancy: Secondary | ICD-10-CM | POA: Diagnosis not present

## 2023-10-14 DIAGNOSIS — F419 Anxiety disorder, unspecified: Secondary | ICD-10-CM

## 2023-10-14 MED ORDER — SERTRALINE HCL 25 MG PO TABS: 25.0000 mg | ORAL_TABLET | Freq: Every day | ORAL | 6 refills | Status: DC

## 2023-10-14 MED ORDER — PRENATAL PLUS VITAMIN/MINERAL 27-1 MG PO TABS
1.0000 | ORAL_TABLET | Freq: Every day | ORAL | 11 refills | Status: AC
Start: 2023-10-14 — End: ?

## 2023-10-14 NOTE — Progress Notes (Signed)
 PRENATAL VISIT NOTE  Subjective:  Angela Sexton is a 33 y.o. H5E7987 at [redacted]w[redacted]d being seen today for ongoing prenatal care.  She is currently monitored for the following issues for this low-risk pregnancy and has Supervision of low-risk pregnancy and History of syphilis on their problem list.  Patient reports no physical complaints but has noticed that her anxiety has gotten significantly worse. 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 Took zoloft without issue, then switched doctors and he put her on lamictal which she was on for a year but did not feel it worked well for her. Has noticed racing thoughts, increased irritability/overstimulation, feeling withdrawn because of this and then depressed or agitated. Has a family history of possibly autism on her father's side but mother also endorses similar symptoms. Has an appointment with a doc she likes at [facility] but not until August. Strongly desires to be back on zoloft prior to August as her anxiety is making her have suicidal ideation (no intent or plans).  Contractions: Not present. Vag. Bleeding: None.  Movement: Present. Denies leaking of fluid.   The following portions of the patient's history were reviewed and updated as appropriate: allergies, current medications, past family history, past medical history, past social history, past surgical history and problem list.   Objective:   Vitals:   10/14/23 1615  BP: 114/64  Pulse: 84  Weight: 195 lb 3.2 oz (88.5 kg)   Fetal Status:  Fetal Heart Rate (bpm): 151   Movement: Present    General: Alert, oriented and cooperative. Patient is in no acute distress.  Skin: Skin is warm and dry. No rash noted.   Cardiovascular: Normal heart rate noted  Respiratory: Normal respiratory effort, no problems with respiration noted  Abdomen: Soft, gravid, appropriate for gestational age.  Pain/Pressure: Absent     Pelvic:  Cervical exam deferred        Extremities: Normal range of motion.  Edema: Mild pitting, slight indentation  Mental Status: Normal mood and affect. Normal behavior. Normal judgment and thought content.   Assessment and Plan:  Pregnancy: H5E7987 at [redacted]w[redacted]d 1. Encounter for supervision of low-risk pregnancy, antepartum (Primary) - Doing well, feeling regular fetal movement  - Prenatal Vit-Fe Fumarate-FA (PRENATAL PLUS VITAMIN/MINERAL) 27-1 MG TABS; Take 1 tablet by mouth daily.  Dispense: 30 tablet; Refill: 11  2. [redacted] weeks gestation of pregnancy - Routine OB care, anatomy scan normal  3. Anxiety - Discussed possibility of neurodivergence and encouraged her to explore that with her new therapist. Also gave resources for research and self-identification. - Gave precautions for Zoloft's propensity for causing a manic episode or worsening SI, mother present and engaged, pt promised to let us  know of symptoms if this happens.  - sertraline (ZOLOFT) 25 MG tablet; Take 1 tablet (25 mg total) by mouth daily.  Dispense: 30 tablet; Refill: 6  Preterm labor symptoms and general obstetric precautions including but not limited to vaginal bleeding, contractions, leaking of fluid and fetal movement were reviewed in detail with the patient. Please refer to After Visit Summary for other counseling recommendations.   Return in about 4 weeks (around 11/11/2023) for IN-PERSON, LOB/GTT.  Future Appointments  Date Time Provider Department Center  11/18/2023  8:20 AM [facility]  11/18/2023  9:15 AM Vannie Cornell SAUNDERS, CNM [facility]  11/20/2023 10:00 AM Ty Bernice RAMAN, [facility] None  Cornell JONELLE Finder, CNM

## 2023-10-15 ENCOUNTER — Encounter: Admitting: Obstetrics & Gynecology

## 2023-10-27 ENCOUNTER — Encounter: Payer: Self-pay | Admitting: Certified Nurse Midwife

## 2023-10-29 ENCOUNTER — Emergency Department (HOSPITAL_BASED_OUTPATIENT_CLINIC_OR_DEPARTMENT_OTHER)
Admission: EM | Admit: 2023-10-29 | Discharge: 2023-10-29 | Disposition: A | Discharge status: Home / Self Care | Attending: Emergency Medicine | Admitting: Emergency Medicine

## 2023-10-29 ENCOUNTER — Emergency Department (HOSPITAL_BASED_OUTPATIENT_CLINIC_OR_DEPARTMENT_OTHER)

## 2023-10-29 ENCOUNTER — Other Ambulatory Visit: Payer: Self-pay

## 2023-10-29 DIAGNOSIS — R059 Cough, unspecified: Secondary | ICD-10-CM | POA: Diagnosis not present

## 2023-10-29 DIAGNOSIS — E876 Hypokalemia: Secondary | ICD-10-CM | POA: Insufficient documentation

## 2023-10-29 DIAGNOSIS — R0789 Other chest pain: Secondary | ICD-10-CM | POA: Insufficient documentation

## 2023-10-29 DIAGNOSIS — R062 Wheezing: Secondary | ICD-10-CM | POA: Insufficient documentation

## 2023-10-29 DIAGNOSIS — O99282 Endocrine, nutritional and metabolic diseases complicating pregnancy, second trimester: Secondary | ICD-10-CM | POA: Diagnosis not present

## 2023-10-29 DIAGNOSIS — R0602 Shortness of breath: Secondary | ICD-10-CM | POA: Diagnosis not present

## 2023-10-29 DIAGNOSIS — J9801 Acute bronchospasm: Secondary | ICD-10-CM

## 2023-10-29 DIAGNOSIS — O26892 Other specified pregnancy related conditions, second trimester: Secondary | ICD-10-CM | POA: Diagnosis present

## 2023-10-29 DIAGNOSIS — Z3A24 24 weeks gestation of pregnancy: Secondary | ICD-10-CM | POA: Diagnosis not present

## 2023-10-29 LAB — BASIC METABOLIC PANEL WITH GFR
Anion gap: 12 (ref 5–15)
BUN: <5 mg/dL — ABNORMAL LOW (ref 6–20)
CO2: 21 mmol/L — ABNORMAL LOW (ref 22–32)
Calcium: 8.6 mg/dL — ABNORMAL LOW (ref 8.9–10.3)
Chloride: 103 mmol/L (ref 98–111)
Creatinine, Ser: 0.38 mg/dL — ABNORMAL LOW (ref 0.44–1.00)
GFR, Estimated: >60 mL/min (ref >60)
Glucose, Bld: 90 mg/dL (ref 70–99)
Potassium: 2.9 mmol/L — ABNORMAL LOW (ref 3.5–5.1)
Sodium: 137 mmol/L (ref 135–145)

## 2023-10-29 LAB — CBC
HCT: 31.6 % — ABNORMAL LOW (ref 36.0–46.0)
Hemoglobin: 10.5 g/dL — ABNORMAL LOW (ref 12.0–15.0)
MCH: 30 pg (ref 26.0–34.0)
MCHC: 33.2 g/dL (ref 30.0–36.0)
MCV: 90.3 fL (ref 80.0–100.0)
Platelets: 212 K/uL (ref 150–400)
RBC: 3.5 MIL/uL — ABNORMAL LOW (ref 3.87–5.11)
RDW: 12.9 % (ref 11.5–15.5)
WBC: 5.5 K/uL (ref 4.0–10.5)
nRBC: 0 % (ref 0.0–0.2)

## 2023-10-29 LAB — RESP PANEL BY RT-PCR (RSV, FLU A&B, COVID)  RVPGX2
Influenza A by PCR: NEGATIVE
Influenza B by PCR: NEGATIVE
Resp Syncytial Virus by PCR: NEGATIVE
SARS Coronavirus 2 by RT PCR: NEGATIVE

## 2023-10-29 MED ORDER — OXYMETAZOLINE HCL 0.05 % NA SOLN
1.0000 | Freq: Once | NASAL | Status: AC
  Administered 2023-10-29: 1 via NASAL
  Filled 2023-10-29: qty 30

## 2023-10-29 MED ORDER — LIDOCAINE HCL 2 % IJ SOLN
INTRAMUSCULAR | Status: AC
  Administered 2023-10-29: 2.5 mL
  Filled 2023-10-29: qty 20

## 2023-10-29 MED ORDER — IPRATROPIUM-ALBUTEROL 0.5-2.5 (3) MG/3ML IN SOLN
3.0000 mL | Freq: Once | RESPIRATORY_TRACT | Status: AC
  Administered 2023-10-29: 3 mL via RESPIRATORY_TRACT
  Filled 2023-10-29: qty 3

## 2023-10-29 MED ORDER — LIDOCAINE HCL (PF) 2% IJ FOR NEBU
2.5000 mL | Freq: Once | RESPIRATORY_TRACT | Status: AC
  Administered 2023-10-29: 2.5 mL via RESPIRATORY_TRACT
  Filled 2023-10-29: qty 5

## 2023-10-29 MED ORDER — AEROCHAMBER PLUS FLO-VU MISC
1.0000 | Freq: Once | Status: AC
  Administered 2023-10-29: 1
  Filled 2023-10-29: qty 1

## 2023-10-29 MED ORDER — MAGNESIUM SULFATE 2 GM/50ML IV SOLN
2.0000 g | INTRAVENOUS | Status: AC
  Administered 2023-10-29: 2 g via INTRAVENOUS
  Filled 2023-10-29: qty 50

## 2023-10-29 MED ORDER — ALBUTEROL SULFATE HFA 108 (90 BASE) MCG/ACT IN AERS
2.0000 | INHALATION_SPRAY | Freq: Once | RESPIRATORY_TRACT | Status: AC
Start: 2023-10-29 — End: 2023-10-29
  Administered 2023-10-29: 2 via RESPIRATORY_TRACT
  Filled 2023-10-29: qty 6.7

## 2023-10-29 MED ORDER — FLUTICASONE-SALMETEROL 250-50 MCG/ACT IN AEPB: 1.0000 | INHALATION_SPRAY | Freq: Two times a day (BID) | RESPIRATORY_TRACT | 0 refills | Status: DC

## 2023-10-29 MED ORDER — FAMOTIDINE 20 MG PO TABS
20.0000 mg | ORAL_TABLET | Freq: Once | ORAL | Status: AC
  Administered 2023-10-29: 20 mg via ORAL
  Filled 2023-10-29: qty 1

## 2023-10-29 MED ORDER — POTASSIUM CHLORIDE CRYS ER 20 MEQ PO TBCR: 20.0000 meq | EXTENDED_RELEASE_TABLET | Freq: Two times a day (BID) | ORAL | 0 refills | Status: DC

## 2023-10-29 MED ORDER — POTASSIUM CHLORIDE CRYS ER 20 MEQ PO TBCR
40.0000 meq | EXTENDED_RELEASE_TABLET | Freq: Once | ORAL | Status: AC
Start: 2023-10-29 — End: 2023-10-29
  Administered 2023-10-29: 40 meq via ORAL
  Filled 2023-10-29: qty 2

## 2023-10-29 MED ORDER — DEXAMETHASONE SODIUM PHOSPHATE 10 MG/ML IJ SOLN
10.0000 mg | Freq: Once | INTRAMUSCULAR | Status: AC
Start: 2023-10-29 — End: 2023-10-29
  Administered 2023-10-29: 10 mg via INTRAVENOUS
  Filled 2023-10-29: qty 1

## 2023-10-29 NOTE — ED Notes (Signed)
 Spoke with Tiffany rapid OB RN. Will consult Dr. Erik for considering discontinuing FHR monitoring.

## 2023-10-29 NOTE — ED Triage Notes (Signed)
 Pt POV reporting persistent cough and chest pain that began today. Hx asthma, used inhaler w minimal improvement. Pt is 6 months pregnant.

## 2023-10-29 NOTE — ED Notes (Signed)
 Cleared by Dr. Erik from [facility] monitoring.

## 2023-10-29 NOTE — Progress Notes (Signed)
 1926 Call from [facility] ED. Pt is G4P2 [redacted]w[redacted]d with complaint of shortness of breath chest pain. Per ED staff Signe, pt's vitals are WNL.  1936 Pt in OBIX, on monitor HR 140, no contractions noted from toco with interference from coughing from pt.  2034 Pt cleared by OB, ok to D/C monitors.

## 2023-10-29 NOTE — ED Provider Notes (Signed)
 I provided a substantive portion of the care of this patient.  I personally made/approved the management plan for this patient and take responsibility for the patient management.  EKG Interpretation Date/Time:  Thursday October 29 2023 19:21:36 EDT Ventricular Rate:  90 PR Interval:  146 QRS Duration:  78 QT Interval:  344 QTC Calculation: 420 R Axis:   34  Text Interpretation: Normal sinus rhythm Normal ECG When compared with ECG of 27-Sep-2020 00:08, PREVIOUS ECG IS PRESENT Confirmed by Zackowski, Scott [phone]) on 10/29/2023 7:24:38 PM   Patient reports that after eating oatmeal earlier today she started to get recurrent dry cough.  She reports it started kind of mild and then she just could not stop coughing.  Patient denies that she choked or had any difficulty breathing while eating.  Patient does have a history of asthma but she denies ever having had attacks of dry cough like this that would not resolve.  She did not have fever or chest pain.  Patient is alert.  Nontoxic.  Clear mental status.  No acute distress.  She is having frequent dry cough.  Lungs are clear throughout with good airflow.  Heart is regular borderline tachycardic.  No stridor.  Patient has had nebulizer therapy but still has cough.  Will try a low-dose nebulized lidocaine .  Also consideration given to reflux.  Patient is 6 months gestation and symptoms started after eating.   Armenta Canning, MD 10/29/23 2209

## 2023-10-29 NOTE — Discharge Instructions (Addendum)
 Asthma Treatment in Pregnancy  1) Use your albuterol  2 puffs every 4 hours as needed. 2) drink warm liquids with honey for cough and airway      **Outpatient Treatment of Bronchitis and Bronchospasm in the Second Trimester of Pregnancy**      Having asthma or bronchospasm (wheezing and trouble breathing) during pregnancy can be worrying, but with the right treatment, most people do very well. Good control of your breathing is important for both your health and your baby's health. Poorly controlled asthma can increase the risk of problems like preterm birth, low birth weight, and high blood pressure during pregnancy.[1][2][3][4]      **Your Treatment Plan**      Your doctor has prescribed a combination of medicines to help you breathe better and prevent asthma attacks:      - **Long-acting beta-agonist (LABA) and steroid (inhaled corticosteroid, ICS) combination inhaler:** This medicine helps keep your airways open and reduces swelling in your lungs. It is used every day, even when you feel well, to prevent symptoms. Common examples include budesonide/formoterol or fluticasone /salmeterol. Budesonide is often preferred in pregnancy because it has the most safety data, but other inhaled steroids are also considered safe if they have worked well for you before.[5]      - **Short-acting beta-agonist (SABA) inhaler:** This is your "rescue" inhaler (such as albuterol ). Use it when you have sudden symptoms like wheezing, coughing, or shortness of breath. It works quickly to relax the muscles in your airways.[5]      **Are These Medicines Safe in Pregnancy?**      Yes. The National Heart, Lung, and Blood Institute and the Global Initiative for Asthma both recommend continuing these medicines during pregnancy. Studies show that these inhalers do not increase the risk of birth defects or other problems for your baby.[3][5][6] In fact, stopping your asthma medicines can be much riskier than taking them.       **How to Use Your Inhalers**      - Use your LABA/ICS inhaler every day as prescribed, even if you feel fine.      - Use your SABA inhaler only when you have symptoms.      - Make sure you know how to use your inhalers correctly. Ask your doctor or pharmacist to show you if you are unsure.      **Home Care Tips**      - **Avoid triggers:** Stay away from things that make your breathing worse, like

## 2023-10-29 NOTE — ED Notes (Signed)
 Moved pt from res room to 5. Assisted pt to the bathroom. Sent urine specimen to the lab with a temp sunquest label. Hooked pt back up to the cardiac monitor.

## 2023-10-29 NOTE — ED Provider Notes (Signed)
 [place] EMERGENCY DEPARTMENT AT [facility] Provider Note   CSN: [id number] Arrival date & time: 10/29/23  1910     Patient presents with: Cough, Shortness of Breath, and Chest Pain   Angela Sexton is a 33 y.o. female who is G3 P2-0-0-2 at approximately 24 weeks 6 days pregnant.  She reports she had sudden onset of a cough this afternoon that has been progressively worsening.  She gets urgency to cough in her chest and cannot stop.  She denies a history of asthma but states she did use an inhaler 1 time today without relief.  She also has hoarse voice which is new.  She denies specifically chest pain or pain with breathing but does note that her chest feels uncomfortable after she has been coughing very hard for short period of time.  She denies fevers, chills, nasal congestion.  She has been having a healthy pregnancy so far.    Cough Associated symptoms: chest pain and shortness of breath   Shortness of Breath Associated symptoms: chest pain and cough   Chest Pain Associated symptoms: cough and shortness of breath        Prior to Admission medications   Medication Sig Start Date End Date Taking? Authorizing Provider  Acetaminophen -Caffeine  (EXCEDRIN TENSION HEADACHE PO) Take by mouth.    [provider]  albuterol  (VENTOLIN  HFA) 108 (90 Base) MCG/ACT inhaler Inhale 2 puffs into the lungs every 6 (six) hours as needed for wheezing or shortness of breath. 09/24/23   Vannie Cornell SAUNDERS, CNM  aspirin  EC 81 MG tablet Take 1 tablet (81 mg total) by mouth daily. Swallow whole. Do not begin taking until 07/31/23. Patient not taking: Reported on 10/14/2023 07/22/23   Vannie Cornell SAUNDERS, CNM  fluticasone  (FLONASE ) 50 MCG/ACT nasal spray Place 2 sprays into both nostrils daily. Patient not taking: Reported on 10/14/2023 08/19/23   Vannie Cornell SAUNDERS, CNM  Prenatal Vit-Fe Fumarate-FA (PRENATAL PLUS VITAMIN/MINERAL) 27-1 MG TABS Take 1 tablet by mouth daily. 10/14/23   Vannie Cornell SAUNDERS, CNM  sertraline  (ZOLOFT ) 25 MG tablet Take 1 tablet (25 mg total) by mouth daily. 10/14/23   Vannie Cornell SAUNDERS, CNM    Allergies: Sulfa antibiotics    Review of Systems  Respiratory:  Positive for cough and shortness of breath.   Cardiovascular:  Positive for chest pain.    Updated Vital Signs BP 126/77   Pulse 86   Temp 98.7 F (37.1 C) (Oral)   Resp (!) 9   Ht 5' 8 (1.727 m)   Wt 86.2 kg   LMP 05/08/2023 (Exact Date)   SpO2 100%   BMI 28.89 kg/m   Physical Exam Vitals and nursing note reviewed.  Constitutional:      General: She is not in acute distress.    Appearance: She is well-developed. She is not diaphoretic.  HENT:     Head: Normocephalic and atraumatic.     Right Ear: External ear normal.     Left Ear: External ear normal.     Nose: Nose normal.     Mouth/Throat:     Mouth: Mucous membranes are moist.  Eyes:     General: No scleral icterus.    Conjunctiva/sclera: Conjunctivae normal.  Cardiovascular:     Rate and Rhythm: Normal rate and regular rhythm.     Heart sounds: Normal heart sounds. No murmur heard.    No friction rub. No gallop.  Pulmonary:     Effort: Pulmonary effort is normal. No respiratory  distress.     Breath sounds: Normal breath sounds.     Comments: Tight wheezy constant cough and transmitted upper airway sounds.  No wheezes throughout the lung fields Abdominal:     General: Bowel sounds are normal. There is no distension.     Palpations: Abdomen is soft. There is no mass.     Tenderness: There is no abdominal tenderness. There is no guarding.  Musculoskeletal:     Cervical back: Normal range of motion.     Right lower leg: No edema.     Left lower leg: No edema.  Skin:    General: Skin is warm and dry.  Neurological:     Mental Status: She is alert and oriented to person, place, and time.  Psychiatric:        Behavior: Behavior normal.     (all labs ordered are listed, but only abnormal results are displayed) Labs  Reviewed  CBC - Abnormal; Notable for the following components:      Result Value   RBC 3.50 (*)    Hemoglobin 10.5 (*)    HCT 31.6 (*)    All other components within normal limits  RESP PANEL BY RT-PCR (RSV, FLU A&B, COVID)  [id number]  BASIC METABOLIC PANEL WITH GFR    EKG: EKG Interpretation Date/Time:  Thursday October 29 2023 19:21:36 EDT Ventricular Rate:  90 PR Interval:  146 QRS Duration:  78 QT Interval:  344 QTC Calculation: 420 R Axis:   34  Text Interpretation: Normal sinus rhythm Normal ECG When compared with ECG of 27-Sep-2020 00:08, PREVIOUS ECG IS PRESENT Confirmed by Zackowski, Scott [phone]) on 10/29/2023 7:24:38 PM  Radiology: No results found.   Procedures   Medications Ordered in the ED  ipratropium-albuterol  (DUONEB) 0.5-2.5 (3) MG/3ML nebulizer solution 3 mL (3 mLs Nebulization Given 10/29/23 1941)    Clinical Course as of 10/30/23 1257  Thu Oct 29, 2023  2040 Potassium(!): 2.9 [AH]    Clinical Course User Index [AH] Arloa Chroman, PA-C                                 Medical Decision Making Amount and/or Complexity of Data Reviewed Labs: ordered. Decision-making details documented in ED Course. Radiology: ordered.  Risk OTC drugs. Prescription drug management.   Is a 33 year old female who is currently 24 weeks 6 days pregnant who presents for sudden onset bronchitic cough and wheezing. Differential diagnosis for emergent cause of cough includes but is not limited to upper respiratory infection, lower respiratory infection, allergies, asthma, irritants, foreign body, medications such as ACE inhibitors, reflux, asthma, CHF, lung cancer, interstitial lung disease, psychiatric causes, postnasal drip and postinfectious bronchospasm. She has no known history of asthma but does have a family history of asthma and used one of her relatives inhalers.  Patient monitored and cleared by OB/GYN.  Medication choices treatment and assessment complicated by  current pregnant status.  I ordered labs including respiratory panel which is negative for RSV flu or COVID.  I ordered a BMP which shows potassium of 2.9 which may also be lower secondary to the patient's recent albuterol  use however I have given her oral repletion here and plan on giving her a few days of oral medication at home.  Patient's CBC shows mild anemia expected in the course of the patient's pregnancy.  I visualized interpreted EKG which shows normal sinus rhythm at a rate of 90.  Patient given 2 DuoNeb treatments with some relief in her symptoms along with IV steroids and magnesium .   Patient had moderate improvement for period of time but then began coughing heavily again.  Patient was then treated with nebulized 2% lidocaine , 2.5 mL in saline with complete resolution of her coughing.   Patient will be discharged with albuterol  inhaler and spacer, Wixela laba/steroid, oral potassium.  Close outpatient follow-up recommended within the next week.  She will need recheck of her labs and further evaluation.  Discussed outpatient follow-up and return precautions.      Final diagnoses:  None    ED Discharge Orders     None          Arloa Chroman, PA-C 10/30/23 1302    Armenta Canning, MD 10/30/23 [phone]

## 2023-11-04 ENCOUNTER — Encounter: Payer: Self-pay | Admitting: Family Medicine

## 2023-11-04 ENCOUNTER — Encounter: Payer: Self-pay | Admitting: Certified Nurse Midwife

## 2023-11-04 ENCOUNTER — Ambulatory Visit: Payer: Self-pay | Admitting: Family Medicine

## 2023-11-04 ENCOUNTER — Ambulatory Visit (INDEPENDENT_AMBULATORY_CARE_PROVIDER_SITE_OTHER): Admitting: Family Medicine

## 2023-11-04 VITALS — BP 116/72 | HR 90 | Temp 97.6°F | Ht 68.0 in | Wt 192.0 lb

## 2023-11-04 DIAGNOSIS — Z3A25 25 weeks gestation of pregnancy: Secondary | ICD-10-CM | POA: Diagnosis not present

## 2023-11-04 DIAGNOSIS — R062 Wheezing: Secondary | ICD-10-CM

## 2023-11-04 DIAGNOSIS — E876 Hypokalemia: Secondary | ICD-10-CM | POA: Diagnosis not present

## 2023-11-04 DIAGNOSIS — F319 Bipolar disorder, unspecified: Secondary | ICD-10-CM

## 2023-11-04 LAB — BASIC METABOLIC PANEL WITH GFR
BUN: 8 mg/dL (ref 6–23)
CO2: 26 meq/L (ref 19–32)
Calcium: 8.5 mg/dL (ref 8.4–10.5)
Chloride: 103 meq/L (ref 96–112)
Creatinine, Ser: 0.36 mg/dL — ABNORMAL LOW (ref 0.40–1.20)
GFR: 135.71 mL/min (ref >60.00)
Glucose, Bld: 72 mg/dL (ref 70–99)
Potassium: 3.5 meq/L (ref 3.5–5.1)
Sodium: 135 meq/L (ref 135–145)

## 2023-11-04 LAB — CBC WITH DIFFERENTIAL/PLATELET
Basophils Absolute: 0.1 K/uL (ref 0.0–0.1)
Basophils Relative: 1.1 % (ref 0.0–3.0)
Eosinophils Absolute: 0.2 K/uL (ref 0.0–0.7)
Eosinophils Relative: 2.7 % (ref 0.0–5.0)
HCT: 33 % — ABNORMAL LOW (ref 36.0–46.0)
Hemoglobin: 11.1 g/dL — ABNORMAL LOW (ref 12.0–15.0)
Lymphocytes Relative: 36.1 % (ref 12.0–46.0)
Lymphs Abs: 2.5 K/uL (ref 0.7–4.0)
MCHC: 33.5 g/dL (ref 30.0–36.0)
MCV: 90.3 fl (ref 78.0–100.0)
Monocytes Absolute: 0.5 K/uL (ref 0.1–1.0)
Monocytes Relative: 6.4 % (ref 3.0–12.0)
Neutro Abs: 3.8 K/uL (ref 1.4–7.7)
Neutrophils Relative %: 53.7 % (ref 43.0–77.0)
Platelets: 246 K/uL (ref 150.0–400.0)
RBC: 3.66 Mil/uL — ABNORMAL LOW (ref 3.87–5.11)
RDW: 13.3 % (ref 11.5–15.5)
WBC: 7 K/uL (ref 4.0–10.5)

## 2023-11-04 MED ORDER — METHYLPREDNISOLONE 4 MG PO TBPK
ORAL_TABLET | ORAL | 0 refills | Status: DC
Start: 2023-11-04 — End: 2023-11-18

## 2023-11-04 NOTE — Progress Notes (Signed)
 Subjective:     Patient ID: Angela Sexton, female    DOB: [birth year], 33 y.o.   MRN: [id number]  Chief Complaint  Patient presents with   Hospitalization Follow-up    7/10 d/c from [facility], feels the same and very wheezy when lays down. Still having persistent cough, keeping up w the treatment. May need possible allergist referral   Vaginal itching, may have yeast infection.     HPI  History of Present Illness          C/o cough, wheezing and chest congestion. Seen in the ED for sudden onset cough and wheezing on 10/29/2023.  She is 25 weeks 5 days pregnant   States she has been using Wixhela inhaler which helps but only for 2-3 hours.  Denies any new or worsening symptoms since being discharged home from the ED.  Reports being much better than at the ED visit.   She had a negative respiratory panel in the ED, -flu, -RSV, -Covid.  Negative EKG  Patient given 2 DuoNeb treatments along with IV steroids and magnesium  in the ED.   Potassium found to be low and replaced. Will need rechecked.   Mild anemia   Denies hx of asthma but does have a family hx of asthma.  Denies smoking or exposure   Hx of Covid earlier this year.    OB/GYN - Cornell Finder, CNM  Denies any issues with pregnancy.      Health Maintenance Due  Topic Date Due   HPV VACCINES (1 - Risk 3-dose SCDM series) Never done   COVID-19 Vaccine (5 - 2024-25 season) 12/21/2022    Past Medical History:  Diagnosis Date   Allergy    Anemia    Anxiety    Axillary lymphadenopathy 10/09/2020   B12 deficiency 11/09/2020   Bipolar 1 disorder (HCC) 11/26/2022   Bronchogenic cyst 04/21/2014   Formatting of this note might be different from the original.  17mm right  Formatting of this note might be different from the original.  Overview:   17mm right     Formatting of this note might be different from the original.  17mm right     Depression    Eczema    Elevated sed rate 11/09/2020   Facial rash  12/07/2020   Family history of scleroderma 05/08/2021   Folate deficiency 11/09/2020   Hematuria with proteinuria 11/09/2020   Hx of maternal laceration, 4th degree, currently pregnant 06/05/2021   Iron deficiency anemia 11/09/2020   Joint pain 01/19/2021   Pneumonia    infant   Positive ANA (antinuclear antibody) 05/18/2021   Concern for Lupus   Elevated sed rate  Repeat ANA neg  dsDNA neg  SSA/SSB- Negative   Positive ANA (antinuclear antibody) 05/18/2021   Concern for Lupus   Elevated sed rate  Repeat ANA neg  dsDNA neg  SSA/SSB- Negative     Raynaud phenomenon 02/05/2021   Syphilis 05/09/2021   05-08-21  Positive RPR with titer of 1:32--> treated with bicillin  x3   [x]  6 month titer: 1:32 (inappropriate drop) Retreated [name]  x 3  [x]  12 month titer: 1:8 (appropriate drop)  [ ]  18 month titer (12 months after second treatment)          Past Surgical History:  Procedure Laterality Date   WISDOM TOOTH EXTRACTION      Family History  Problem Relation Age of Onset   Asthma Mother    Eczema Mother    Scleroderma Father  Diabetes Father    Melanoma Maternal Grandfather    Lung disease Neg Hx    Colon cancer Neg Hx    Esophageal cancer Neg Hx     Social History   Socioeconomic History   Marital status: Single    Spouse name: Not on file   Number of children: Not on file   Years of education: Not on file   Highest education level: Bachelor's degree (e.g., BA, AB, BS)  Occupational History   Not on file  Tobacco Use   Smoking status: Never   Smokeless tobacco: Never  Vaping Use   Vaping status: Never Used  Substance and Sexual Activity   Alcohol use: Yes    Comment: occ   Drug use: Never   Sexual activity: Yes    Birth control/protection: None  Other Topics Concern   Not on file  Social History Narrative   Not on file   Social Drivers of Health   Financial Resource Strain: High Risk (04/20/2023)   Overall Financial Resource Strain (CARDIA)    Difficulty  of Paying Living Expenses: Hard  Food Insecurity: Food Insecurity Present (07/22/2023)   Hunger Vital Sign    Worried About Running Out of Food in the Last Year: Sometimes true    Ran Out of Food in the Last Year: Sometimes true  Transportation Needs: No Transportation Needs (07/22/2023)   PRAPARE - Administrator, Civil Service (Medical): No    Lack of Transportation (Non-Medical): No  Physical Activity: Insufficiently Active (04/20/2023)   Exercise Vital Sign    Days of Exercise per Week: 2 days    Minutes of Exercise per Session: 40 min  Stress: Stress Concern Present (04/20/2023)   [employer] of Occupational Health - Occupational Stress Questionnaire    Feeling of Stress : Rather much  Social Connections: Moderately Integrated (04/20/2023)   Social Connection and Isolation Panel    Frequency of Communication with Friends and Family: More than three times a week    Frequency of Social Gatherings with Friends and Family: Once a week    Attends Religious Services: More than 4 times per year    Active Member of [employer] or Organizations: No    Attends Engineer, structural: Not on file    Marital Status: Living with partner  Intimate Partner Violence: Unknown (07/25/2021)   Received from [facility]   HITS    Physically Hurt: Not on file    Insult or Talk Down To: Not on file    Threaten Physical Harm: Not on file    Scream or Curse: Not on file    Outpatient Medications Prior to Visit  Medication Sig Dispense Refill   Acetaminophen -Caffeine  (EXCEDRIN TENSION HEADACHE PO) Take by mouth.     albuterol  (VENTOLIN  HFA) 108 (90 Base) MCG/ACT inhaler Inhale 2 puffs into the lungs every 6 (six) hours as needed for wheezing or shortness of breath. 8 g 2   fluticasone  (FLONASE ) 50 MCG/ACT nasal spray Place 2 sprays into both nostrils daily. 32 mL 3   fluticasone -salmeterol (WIXELA INHUB) 250-50 MCG/ACT AEPB Inhale 1 puff into the lungs in the morning and at bedtime.  Rinse and spit with water after each use 60 each 0   potassium chloride  SA (KLOR-CON  M) 20 MEQ tablet Take 1 tablet (20 mEq total) by mouth 2 (two) times daily. 6 tablet 0   Prenatal Vit-Fe Fumarate-FA (PRENATAL PLUS VITAMIN/MINERAL) 27-1 MG TABS Take 1 tablet by mouth daily. 30 tablet  11   sertraline  (ZOLOFT ) 25 MG tablet Take 1 tablet (25 mg total) by mouth daily. 30 tablet 6   aspirin  EC 81 MG tablet Take 1 tablet (81 mg total) by mouth daily. Swallow whole. Do not begin taking until 07/31/23. (Patient not taking: Reported on 11/04/2023) 30 tablet 12   No facility-administered medications prior to visit.    Allergies  Allergen Reactions   Sulfa Antibiotics     Nausea/vomiting, weakness    Review of Systems  Constitutional:  Negative for chills, fever and malaise/fatigue.  HENT:  Negative for congestion and ear pain.   Respiratory:  Positive for cough and wheezing. Negative for hemoptysis and shortness of breath.   Cardiovascular:  Negative for chest pain, palpitations and leg swelling.  Gastrointestinal:  Negative for abdominal pain, constipation, diarrhea, nausea and vomiting.  Genitourinary:  Negative for dysuria, frequency and urgency.  Neurological:  Negative for dizziness, focal weakness and headaches.       Objective:    Physical Exam Constitutional:      General: She is not in acute distress.    Appearance: She is not ill-appearing.  HENT:     Nose: Nose normal.     Mouth/Throat:     Mouth: Mucous membranes are moist.     Pharynx: Oropharynx is clear.  Eyes:     Extraocular Movements: Extraocular movements intact.     Conjunctiva/sclera: Conjunctivae normal.  Cardiovascular:     Rate and Rhythm: Normal rate and regular rhythm.  Pulmonary:     Effort: Pulmonary effort is normal.     Breath sounds: No stridor. Wheezing present. No rhonchi or rales.     Comments: Wheezing throughout with good air movement Musculoskeletal:     Cervical back: Normal range of motion  and neck supple. No tenderness.     Right lower leg: No edema.     Left lower leg: No edema.  Lymphadenopathy:     Cervical: No cervical adenopathy.  Skin:    General: Skin is warm and dry.  Neurological:     General: No focal deficit present.     Mental Status: She is alert and oriented to person, place, and time.     Cranial Nerves: No cranial nerve deficit.     Motor: No weakness.     Coordination: Coordination normal.     Gait: Gait normal.  Psychiatric:        Mood and Affect: Mood normal.        Behavior: Behavior normal.        Thought Content: Thought content normal.      BP 116/72   Pulse 90   Temp 97.6 F (36.4 C) (Temporal)   Ht 5' 8 (1.727 m)   Wt 192 lb (87.1 kg)   LMP 05/08/2023 (Exact Date)   SpO2 99%   BMI 29.19 kg/m  Wt Readings from Last 3 Encounters:  11/04/23 192 lb (87.1 kg)  10/29/23 190 lb (86.2 kg)  10/14/23 195 lb 3.2 oz (88.5 kg)       Assessment & Plan:   Problem List Items Addressed This Visit   None Visit Diagnoses       Bilateral wheezing    -  Primary   Relevant Orders   CBC with Differential/Platelet     [redacted] weeks gestation of pregnancy         Hypokalemia       Relevant Orders   Basic metabolic panel with GFR  She is not in any acute distress.  Wheezes present.  She is not coughing.   Discussed most likely etiology is viral and not bacterial based on history and physical.  Possible GERD however no classic heartburn symptoms.  Consulted with supervising MD, Dr. Rollene, and we will start her on oral steroids.  Continue current inhaler therapy.  Recheck labs due to hypokalemia and anemia.  I asked her to call her OB/GYN office to let them know what is going on with her as well as recommended treatment with oral steroids today.  Imaging not warranted but she may need a chest x-ray going forward if not improving.  Antibiotic therapy does not seem to be indicated today either.  Discussed benefits and risks of oral steroids during  pregnancy.  Strict precautions to follow-up in the ED if she is worsening or has any new symptoms.   I am having Summer Wakeland start on methylPREDNISolone . I am also having her maintain her Acetaminophen -Caffeine  (EXCEDRIN TENSION HEADACHE PO), aspirin  EC, fluticasone , albuterol , sertraline , Prenatal Plus Vitamin/Mineral, fluticasone -salmeterol, and potassium chloride  SA.  Meds ordered this encounter  Medications   methylPREDNISolone  (MEDROL  DOSEPAK) 4 MG TBPK tablet    Sig: 24 mg PO on day 1, then decr. by 4 mg/day x5 days    Dispense:  21 tablet    Refill:  0    Supervising Provider:   ROLLENE NORRIS A [[id number]]

## 2023-11-04 NOTE — Patient Instructions (Addendum)
 Please take the steroids by mouth with food and plenty of water.  Continue using your inhaler as needed.  Reach out to your OB/GYN to let them know what is going on and that you are currently taking oral steroids in case they have an issue with this in regards to your pregnancy.  If you have any new or worsening symptoms, please go to the emergency department or call 911 if needed.

## 2023-11-07 ENCOUNTER — Encounter: Payer: Self-pay | Admitting: Obstetrics & Gynecology

## 2023-11-10 DIAGNOSIS — Z0289 Encounter for other administrative examinations: Secondary | ICD-10-CM

## 2023-11-12 ENCOUNTER — Ambulatory Visit: Admitting: Licensed Clinical Social Worker

## 2023-11-12 DIAGNOSIS — F319 Bipolar disorder, unspecified: Secondary | ICD-10-CM

## 2023-11-12 NOTE — BH Specialist Note (Signed)
 Integrated Behavioral Health via Telemedicine Visit  11/12/2023 Angela Sexton [id number]  Number of Integrated Behavioral Health Clinician visits: 1- Initial Visit  Session Start time: 1546   Session End time: 1623  Total time in minutes: 37    Referring Provider: Cornell Finder Patient/Family location: At home [facility] Provider location: Remote Office All persons participating in visit: Patient and [facility] Types of Service: Individual psychotherapy and Video visit  I connected with Angela Sexton and/or Angela Sexton's patient via  Telephone or Engineer, civil (consulting)  (Video is Caregility application) and verified that I am speaking with the correct person using two identifiers. Discussed confidentiality: Yes   I discussed the limitations of telemedicine and the availability of in person appointments.  Discussed there is a possibility of technology failure and discussed alternative modes of communication if that failure occurs.  I discussed that engaging in this telemedicine visit, they consent to the provision of behavioral healthcare and the services will be billed under their insurance.  Patient and/or legal guardian expressed understanding and consented to Telemedicine visit: Yes   Presenting Concerns: Patient and/or family reports the following symptoms/concerns: Increased anxiety and depressive symptoms.  Duration of problem: Months; Severity of problem: moderate  Patient and/or Family's Strengths/Protective Factors: Concrete supports in place (healthy food, safe environments, etc.), Physical Health (exercise, healthy diet, medication compliance, etc.), Caregiver has knowledge of parenting & child development, and Parental Resilience  Goals Addressed: Patient will:  Reduce symptoms of: anxiety, depression, and mood instability   Increase knowledge and/or ability of: coping skills, healthy habits, and self-management skills   Demonstrate ability to: Increase  healthy adjustment to current life circumstances and Increase adequate support systems for patient/family  Progress towards Goals: Ongoing    Interventions: Interventions utilized:  Mindfulness or Relaxation Training, Supportive Counseling, Psychoeducation and/or Health Education, and Supportive Reflection Standardized Assessments completed: Not Needed    Patient and/or Family Response: Patient was present for today's scheduled virtual session. She is currently pregnant with her third child and has two children, ages 66 and 2. She works as a Tax adviser and reported experiencing increased symptoms of depression and anxiety, along with feeling persistently exhausted and overwhelmed. She shared that she has a history of bipolar depression, anxiety, and PTSD, and has previously been prescribed Vraylar, which she felt was effective; Lamictal, which was ineffective; Sertraline , which helped temporarily; and hydroxyzine, which caused excessive drowsiness. The patient acknowledged a recent decline in using previously helpful coping strategies--such as walking, watching TV, cleaning, showering, and making to-do lists--due to lack of time and elevated stress. During the session, she expressed insight into the importance of self-care and slowing down, and she agreed to begin intentionally scheduling time for herself, including adding herself to her to-do list, rewarding herself after completing tasks, and planning to engage in self-care activities such as going out once or twice a month.   Clinical Assessment/Diagnosis  No diagnosis found.    Assessment: Patient currently experiencing increased symptoms of depression and anxiety, along with emotional and physical exhaustion, due to the demands of pregnancy, parenting, and her role as a Arts development officer. She reports feeling overwhelmed and has not been utilizing her usual coping strategies due to lack of time and energy..   Patient may benefit  from continued support from integrated behavioral health services.  Plan: Follow up with behavioral health clinician on : 11/19/23 Behavioral recommendations: patient to re-engage with coping strategies and prioritize consistent self-care practices to support emotional regulation and stress management. She  is encouraged to implement small, intentional self-care activities and reward-based task completion to help restore balance and improve mood. Referral(s): Integrated [employer] (In Clinic)  I discussed the assessment and treatment plan with the patient and/or parent/guardian. They were provided an opportunity to ask questions and all were answered. They agreed with the plan and demonstrated an understanding of the instructions.   They were advised to call back or seek an in-person evaluation if the symptoms worsen or if the condition fails to improve as anticipated.  Angela Sexton, LCSWA

## 2023-11-17 ENCOUNTER — Other Ambulatory Visit: Payer: Self-pay

## 2023-11-17 DIAGNOSIS — Z3A27 27 weeks gestation of pregnancy: Secondary | ICD-10-CM

## 2023-11-18 ENCOUNTER — Ambulatory Visit: Payer: Self-pay | Admitting: Certified Nurse Midwife

## 2023-11-18 ENCOUNTER — Other Ambulatory Visit: Payer: Self-pay

## 2023-11-18 ENCOUNTER — Other Ambulatory Visit (HOSPITAL_COMMUNITY)
Admission: RE | Admit: 2023-11-18 | Discharge: 2023-11-18 | Disposition: A | Discharge status: Home / Self Care | Source: Ambulatory Visit | Attending: Certified Nurse Midwife | Admitting: Certified Nurse Midwife

## 2023-11-18 VITALS — BP 102/70 | HR 91 | Wt 191.3 lb

## 2023-11-18 DIAGNOSIS — N898 Other specified noninflammatory disorders of vagina: Secondary | ICD-10-CM | POA: Diagnosis not present

## 2023-11-18 DIAGNOSIS — Z3A27 27 weeks gestation of pregnancy: Secondary | ICD-10-CM

## 2023-11-18 DIAGNOSIS — R051 Acute cough: Secondary | ICD-10-CM

## 2023-11-18 DIAGNOSIS — Z3492 Encounter for supervision of normal pregnancy, unspecified, second trimester: Secondary | ICD-10-CM

## 2023-11-18 MED ORDER — AZITHROMYCIN 250 MG PO TABS: ORAL_TABLET | ORAL | 0 refills | Status: DC

## 2023-11-18 MED ORDER — FLUCONAZOLE 150 MG PO TABS: 150.0000 mg | ORAL_TABLET | Freq: Every day | ORAL | 1 refills | Status: DC

## 2023-11-18 NOTE — Progress Notes (Signed)
 Patient informed me that she has a behavior health appointment scheduled for tomorrow.  Tdap vaccine was explained and offered to patient but she declined   Patient complains of vaginal itiching and requested self swab. Instuctions were given and specimen was collected.  Donnielle Addison, CCMA

## 2023-11-18 NOTE — Progress Notes (Signed)
   PRENATAL VISIT NOTE  Subjective:  Angela Sexton is a 33 y.o. [id number] at [redacted]w[redacted]d being seen today for ongoing prenatal care.  She is currently monitored for the following issues for this low-risk pregnancy and has Supervision of low-risk pregnancy and History of syphilis on their problem list.  Patient reports vaginal itching and a continued wet cough that has been unresponsive to a round of mucinex , then UC gave her albuterol , advair and a steroid pack. Has never been treated with antibiotics. Stopped taking Zoloft  because it increased her agitation, but has a visit with Angela Sexton (IB[facility]) and that has been very helpful.  Contractions: Not present. Vag. Bleeding: None.  Movement: Increased. Denies leaking of fluid.   The following portions of the patient's history were reviewed and updated as appropriate: allergies, current medications, past family history, past medical history, past social history, past surgical history and problem list.   Objective:   Vitals:   11/18/23 0934  BP: 102/70  Pulse: 91  Weight: 191 lb 4.8 oz (86.8 kg)   Fetal Status:  Fetal Heart Rate (bpm): 142 Fundal Height: 28 cm Movement: Increased Presentation: Vertex  General: Alert, oriented and cooperative. Patient is in no acute distress.  Skin: Skin is warm and dry. No rash noted.   Cardiovascular: Normal heart rate noted  Respiratory: Normal respiratory effort, no problems with respiration noted  Abdomen: Soft, gravid, appropriate for gestational age.  Pain/Pressure: Absent     Pelvic: Cervical exam deferred        Extremities: Normal range of motion.  Edema: None  Mental Status: Normal mood and affect. Normal behavior. Normal judgment and thought content.   Assessment and Plan:  Pregnancy: [id number] at [redacted]w[redacted]d 1. Encounter for supervision of low-risk pregnancy in second trimester (Primary) - Doing well, feeling regular and vigorous fetal movement   2. [redacted] weeks gestation of pregnancy - Routine OB care including  GTT today  3. Vaginal itching - Cervicovaginal ancillary only( [place]) - fluconazole  (DIFLUCAN ) 150 MG tablet; Take 1 tablet (150 mg total) by mouth daily.  Dispense: 1 tablet; Refill: 1  4. Acute cough - azithromycin  (ZITHROMAX  Z-PAK) 250 MG tablet; Take 500mg  (2 pills) on the first day then one pill daily for 4 days.  Dispense: 6 each; Refill: 0  Preterm labor symptoms and general obstetric precautions including but not limited to vaginal bleeding, contractions, leaking of fluid and fetal movement were reviewed in detail with the patient. Please refer to After Visit Summary for other counseling recommendations.   Return in about 2 weeks (around 12/02/2023) for IN-PERSON, LOB.  Future Appointments  Date Time Provider Department Center  11/19/2023 10:45 AM Sexton Marcelyn CROME, [place] [facility] None  11/20/2023 10:00 AM Ty Bernice RAMAN, [facility] None  12/02/2023  3:55 PM Vannie Cornell SAUNDERS, CNM [facility]    Cornell SAUNDERS Vannie, CNM

## 2023-11-19 ENCOUNTER — Encounter: Admitting: Licensed Clinical Social Worker

## 2023-11-19 LAB — CBC
Hematocrit: 32.6 % — ABNORMAL LOW (ref 34.0–46.6)
Hemoglobin: 10.4 g/dL — ABNORMAL LOW (ref 11.1–15.9)
MCH: 29.7 pg (ref 26.6–33.0)
MCHC: 31.9 g/dL (ref 31.5–35.7)
MCV: 93 fL (ref 79–97)
Platelets: 234 x10E3/uL (ref 150–450)
RBC: 3.5 x10E6/uL — ABNORMAL LOW (ref 3.77–5.28)
RDW: 12.7 % (ref 11.7–15.4)
WBC: 8.1 x10E3/uL (ref 3.4–10.8)

## 2023-11-19 LAB — CERVICOVAGINAL ANCILLARY ONLY
Bacterial Vaginitis (gardnerella): POSITIVE — AB
Candida Glabrata: NEGATIVE
Candida Vaginitis: POSITIVE — AB
Chlamydia: NEGATIVE
Comment: NEGATIVE
Comment: NEGATIVE
Comment: NEGATIVE
Comment: NEGATIVE
Comment: NEGATIVE
Comment: NORMAL
Neisseria Gonorrhea: NEGATIVE
Trichomonas: NEGATIVE

## 2023-11-19 LAB — RPR, QUANT+TP ABS (REFLEX)
Rapid Plasma Reagin, Quant: 1:8 {titer} — ABNORMAL HIGH
T Pallidum Abs: REACTIVE — AB

## 2023-11-19 LAB — GLUCOSE TOLERANCE, 2 HOURS W/ 1HR
Glucose, 1 hour: 115 mg/dL (ref 70–179)
Glucose, 2 hour: 96 mg/dL (ref 70–152)
Glucose, Fasting: 78 mg/dL (ref 70–91)

## 2023-11-19 LAB — RPR: RPR Ser Ql: REACTIVE — AB

## 2023-11-19 LAB — HIV ANTIBODY (ROUTINE TESTING W REFLEX): HIV Screen 4th Generation wRfx: NONREACTIVE

## 2023-11-20 ENCOUNTER — Ambulatory Visit: Payer: Self-pay | Admitting: Certified Nurse Midwife

## 2023-11-20 ENCOUNTER — Ambulatory Visit (HOSPITAL_COMMUNITY): Admitting: Mental Health

## 2023-11-20 DIAGNOSIS — F3181 Bipolar II disorder: Secondary | ICD-10-CM | POA: Diagnosis not present

## 2023-11-20 DIAGNOSIS — F411 Generalized anxiety disorder: Secondary | ICD-10-CM

## 2023-11-20 DIAGNOSIS — Z8659 Personal history of other mental and behavioral disorders: Secondary | ICD-10-CM | POA: Diagnosis not present
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
## 2023-11-23 ENCOUNTER — Ambulatory Visit: Admitting: Licensed Clinical Social Worker

## 2023-11-23 DIAGNOSIS — Z91199 Patient's noncompliance with other medical treatment and regimen due to unspecified reason: Secondary | ICD-10-CM

## 2023-12-01 NOTE — BH Specialist Note (Signed)
 Patient no showed for today's visit.

## 2023-12-02 ENCOUNTER — Ambulatory Visit: Admitting: Certified Nurse Midwife

## 2023-12-02 ENCOUNTER — Other Ambulatory Visit: Payer: Self-pay

## 2023-12-02 VITALS — BP 112/70 | HR 85 | Wt 198.0 lb

## 2023-12-02 DIAGNOSIS — Z3493 Encounter for supervision of normal pregnancy, unspecified, third trimester: Secondary | ICD-10-CM

## 2023-12-02 DIAGNOSIS — Z3A29 29 weeks gestation of pregnancy: Secondary | ICD-10-CM

## 2023-12-02 DIAGNOSIS — N760 Acute vaginitis: Secondary | ICD-10-CM

## 2023-12-02 DIAGNOSIS — B9689 Other specified bacterial agents as the cause of diseases classified elsewhere: Secondary | ICD-10-CM

## 2023-12-02 DIAGNOSIS — F411 Generalized anxiety disorder: Secondary | ICD-10-CM | POA: Diagnosis not present

## 2023-12-02 MED ORDER — METRONIDAZOLE 500 MG PO TABS: 500.0000 mg | ORAL_TABLET | Freq: Two times a day (BID) | ORAL | 0 refills | Status: DC

## 2023-12-03 NOTE — Progress Notes (Signed)
   PRENATAL VISIT NOTE  Subjective:  Angela Sexton is a 33 y.o. [id number] at [redacted]w[redacted]d being seen today for ongoing prenatal care.  She is currently monitored for the following issues for this low-risk pregnancy and has Supervision of low-risk pregnancy; History of syphilis; Bipolar II disorder (HCC); and Generalized anxiety disorder on their problem list.  Patient reports no complaints.  Contractions: Not present. Vag. Bleeding: None.  Movement: Present. Denies leaking of fluid.   The following portions of the patient's history were reviewed and updated as appropriate: allergies, current medications, past family history, past medical history, past social history, past surgical history and problem list.   Objective:    Vitals:   12/02/23 1605  BP: 112/70  Pulse: 85  Weight: 198 lb (89.8 kg)    Fetal Status:  Fetal Heart Rate (bpm): 138   Movement: Present    General: Alert, oriented and cooperative. Patient is in no acute distress.  Skin: Skin is warm and dry. No rash noted.   Cardiovascular: Normal heart rate noted  Respiratory: Normal respiratory effort, no problems with respiration noted  Abdomen: Soft, gravid, appropriate for gestational age.  Pain/Pressure: Absent     Pelvic: Cervical exam deferred        Extremities: Normal range of motion.  Edema: None  Mental Status: Normal mood and affect. Normal behavior. Normal judgment and thought content.   Assessment and Plan:  Pregnancy: [id number] at [redacted]w[redacted]d 1. Encounter for supervision of low-risk pregnancy in third trimester (Primary) - Doing well, feeling regular and vigorous fetal movement   2. [redacted] weeks gestation of pregnancy - Routine OB care  - Encouraged use of compression socks now that she is back to work (works at [employer])  3. Bacterial vaginosis - metroNIDAZOLE  (FLAGYL ) 500 MG tablet; Take 1 tablet (500 mg total) by mouth 2 (two) times daily.  Dispense: 14 tablet; Refill: 0  4. Generalized anxiety disorder - Doing a  little better, has seen [facility] and it went well  Preterm labor symptoms and general obstetric precautions including but not limited to vaginal bleeding, contractions, leaking of fluid and fetal movement were reviewed in detail with the patient. Please refer to After Visit Summary for other counseling recommendations.   Return in about 2 weeks (around 12/16/2023) for IN-PERSON, LOB.  Future Appointments  Date Time Provider Department Center  12/23/2023  2:15 PM Vannie Cornell JONELLE EDDY [facility]  01/06/2024  3:55 PM Vannie Cornell JONELLE, CNM [facility]  01/08/2024  9:00 AM Ty Bernice RAMAN, [facility] None  01/21/2024  3:00 PM Ty Bernice RAMAN, [facility] None    [place] JONELLE Vannie, [place]

## 2023-12-23 ENCOUNTER — Ambulatory Visit: Admitting: Certified Nurse Midwife

## 2023-12-23 ENCOUNTER — Ambulatory Visit (INDEPENDENT_AMBULATORY_CARE_PROVIDER_SITE_OTHER): Admitting: Certified Nurse Midwife

## 2023-12-23 VITALS — BP 118/79 | HR 88 | Wt 202.5 lb

## 2023-12-23 DIAGNOSIS — N898 Other specified noninflammatory disorders of vagina: Secondary | ICD-10-CM | POA: Diagnosis not present

## 2023-12-23 DIAGNOSIS — Z3A33 33 weeks gestation of pregnancy: Secondary | ICD-10-CM

## 2023-12-23 DIAGNOSIS — Z1332 Encounter for screening for maternal depression: Secondary | ICD-10-CM

## 2023-12-23 DIAGNOSIS — F3181 Bipolar II disorder: Secondary | ICD-10-CM

## 2023-12-23 DIAGNOSIS — Z3493 Encounter for supervision of normal pregnancy, unspecified, third trimester: Secondary | ICD-10-CM

## 2023-12-23 DIAGNOSIS — Z3A32 32 weeks gestation of pregnancy: Secondary | ICD-10-CM

## 2023-12-23 MED ORDER — FLUCONAZOLE 150 MG PO TABS: 150.0000 mg | ORAL_TABLET | Freq: Once | ORAL | 0 refills | Status: AC

## 2023-12-23 MED ORDER — MAG-OXIDE 200 MG PO TABS: 400.0000 mg | ORAL_TABLET | Freq: Every day | ORAL | 3 refills | Status: AC

## 2023-12-23 MED ORDER — METRONIDAZOLE 0.75 % VA GEL: 1.0000 | Freq: Every day | VAGINAL | 0 refills | Status: DC

## 2023-12-29 NOTE — Progress Notes (Signed)
 Visit rescheduled

## 2023-12-29 NOTE — Progress Notes (Signed)
   PRENATAL VISIT NOTE  Subjective:  Angela Sexton is a 33 y.o. H5E7987 at [redacted]w[redacted]d being seen today for ongoing prenatal care.  She is currently monitored for the following issues for this low-risk pregnancy and has Supervision of low-risk pregnancy; History of syphilis; Bipolar II disorder (HCC); and Generalized anxiety disorder on their problem list.  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 Having some vaginal itching.  Contractions: Irregular. Vag. Bleeding: None.  Movement: Present. Denies leaking of fluid.   The following portions of the patient's history were reviewed and updated as appropriate: allergies, current medications, past family history, past medical history, past social history, past surgical history and problem list.   Objective:   Vitals:   12/23/23 1608  BP: 118/79  Pulse: 88  Weight: 202 lb 8 oz (91.9 kg)    Fetal Status:  Fetal Heart Rate (bpm): 135 Fundal Height: 31 cm Movement: Present    General: Alert, oriented and cooperative. Patient is in no acute distress.  Skin: Skin is warm and dry. No rash noted.   Cardiovascular: Normal heart rate noted  Respiratory: Normal respiratory effort, no problems with respiration noted  Abdomen: Soft, gravid, appropriate for gestational age.  Pain/Pressure: Present     Pelvic: Cervical exam deferred        Extremities: Normal range of motion.  Edema: Moderate pitting, indentation subsides rapidly  Mental Status: Normal mood and affect. Normal behavior. Normal judgment and thought content.   Assessment and Plan:  Pregnancy: H5E7987 at [redacted]w[redacted]d 1. Encounter for supervision of low-risk pregnancy in third trimester (Primary) - Doing well, feeling regular and vigorous fetal movement   2. [redacted] weeks gestation of pregnancy - Routine OB care  - Magnesium  sent to pharmacy for sleep  support  3. Vaginal itching - Resent metrogel /diflucan  (symptoms are similar and did not finish treatment)  Preterm labor symptoms and general obstetric precautions including but not limited to vaginal bleeding, contractions, leaking of fluid and fetal movement were reviewed in detail with the patient. Please refer to After Visit Summary for other counseling recommendations.   Return in about 2 weeks (around 01/06/2024) for IN-PERSON, LOB.  Future Appointments  Date Time Provider Department Center  01/06/2024  3:55 PM Vannie Cornell JONELLE EDDY [facility]  01/08/2024  9:00 AM Ty Bernice RAMAN, [facility] None  01/20/2024  3:35 PM Vannie Cornell JONELLE, CNM [facility]  01/21/2024  3:00 PM Ty Bernice RAMAN, [facility] None  01/27/2024  2:35 PM Vannie Cornell JONELLE, CNM [facility]  02/03/2024  3:55 PM Vannie Cornell JONELLE EDDY [facility]  02/10/2024  3:15 PM Vannie Cornell JONELLE EDDY [facility]  02/17/2024  2:35 PM Vannie Cornell JONELLE, CNM [facility]    Cornell JONELLE Vannie, CNM

## 2024-01-06 ENCOUNTER — Other Ambulatory Visit: Payer: Self-pay

## 2024-01-06 ENCOUNTER — Ambulatory Visit: Admitting: Certified Nurse Midwife

## 2024-01-06 VITALS — BP 107/77 | HR 92 | Wt 201.5 lb

## 2024-01-06 DIAGNOSIS — Z3A34 34 weeks gestation of pregnancy: Secondary | ICD-10-CM

## 2024-01-06 DIAGNOSIS — L299 Pruritus, unspecified: Secondary | ICD-10-CM

## 2024-01-06 DIAGNOSIS — O99713 Diseases of the skin and subcutaneous tissue complicating pregnancy, third trimester: Secondary | ICD-10-CM

## 2024-01-06 DIAGNOSIS — F411 Generalized anxiety disorder: Secondary | ICD-10-CM

## 2024-01-06 DIAGNOSIS — Z3493 Encounter for supervision of normal pregnancy, unspecified, third trimester: Secondary | ICD-10-CM

## 2024-01-06 MED ORDER — URSODIOL 300 MG PO CAPS: 300.0000 mg | ORAL_CAPSULE | Freq: Two times a day (BID) | ORAL | 1 refills | Status: DC

## 2024-01-06 MED ORDER — ATOMOXETINE HCL 18 MG PO CAPS: 18.0000 mg | ORAL_CAPSULE | Freq: Every day | ORAL | 3 refills | Status: DC

## 2024-01-06 NOTE — Progress Notes (Signed)
   PRENATAL VISIT NOTE  Subjective:  Angela Sexton is a 33 y.o. H5E7987 at [redacted]w[redacted]d being seen today for ongoing prenatal care.  She is currently monitored for the following issues for this low-risk pregnancy and has Supervision of low-risk pregnancy; History of syphilis; Bipolar II disorder (HCC); and Generalized anxiety disorder on their problem list.  Patient reports itching all over without a rash on plams of hands, arms, legs, and soles of feet. Per patient husband, he has also noticed an increase in her stress levels over the past couple of weeks.  Contractions: Not present. Vag. Bleeding: None.  Movement: Present. Denies leaking of fluid.   The following portions of the patient's history were reviewed and updated as appropriate: allergies, current medications, past family history, past medical history, past social history, past surgical history and problem list.   Objective:   Vitals:   01/06/24 1634  BP: 107/77  Pulse: 92  Weight: 201 lb 8 oz (91.4 kg)   Fetal Status:  Fetal Heart Rate (bpm): 140 Fundal Height: 34 cm Movement: Present    General: Alert, oriented and cooperative. Patient is in no acute distress.  Skin: Skin is warm and dry. No rash noted.   Cardiovascular: Normal heart rate noted  Respiratory: Normal respiratory effort, no problems with respiration noted  Abdomen: Soft, gravid, appropriate for gestational age.  Pain/Pressure: Present     Pelvic: Cervical exam deferred        Extremities: Normal range of motion.  Edema: Mild pitting, slight indentation  Mental Status: Normal mood and affect. Normal behavior. Normal judgment and thought content.   Assessment and Plan:  Pregnancy: H5E7987 at [redacted]w[redacted]d 1. Encounter for supervision of low-risk pregnancy in third trimester (Primary) - Doing well, feeling regular and vigorous fetal movement   2. [redacted] weeks gestation of pregnancy - Routine OB care   3. Pruritus of pregnancy in third trimester - Discussed signs and  symptoms of cholestasis. Will check labs today. - Comp Met (CMET) - Bile acids, total - ursodiol  (ACTIGALL ) 300 MG capsule; Take 1 capsule (300 mg total) by mouth 2 (two) times daily.  Dispense: 60 capsule; Refill: 1  4. Generalized anxiety disorder - atomoxetine  (STRATTERA ) 18 MG capsule; Take 1 capsule (18 mg total) by mouth daily.  Dispense: 30 capsule; Refill: 3  Preterm labor symptoms and general obstetric precautions including but not limited to vaginal bleeding, contractions, leaking of fluid and fetal movement were reviewed in detail with the patient. Please refer to After Visit Summary for other counseling recommendations.    Future Appointments  Date Time Provider Department Center  01/08/2024  9:00 AM Ty Bernice RAMAN, [facility] None  01/20/2024  3:35 PM Vannie Cornell SAUNDERS, CNM [facility]  01/21/2024  3:00 PM Ty Bernice RAMAN, [facility] None  01/27/2024  2:15 PM Vannie Cornell SAUNDERS, CNM [facility]  02/03/2024  3:55 PM Vannie Cornell SAUNDERS EDDY [facility]  02/10/2024  3:15 PM Vannie Cornell SAUNDERS EDDY [facility]  02/17/2024  2:35 PM Vannie Cornell SAUNDERS, CNM [facility]    Tommy Daring, NP-S w/ J. Vannie

## 2024-01-07 LAB — COMPREHENSIVE METABOLIC PANEL WITH GFR
ALT: 11 IU/L (ref 0–32)
AST: 13 IU/L (ref 0–40)
Albumin: 3.7 g/dL — ABNORMAL LOW (ref 3.9–4.9)
Alkaline Phosphatase: 125 IU/L — ABNORMAL HIGH (ref 41–116)
BUN/Creatinine Ratio: 14 (ref 9–23)
BUN: 7 mg/dL (ref 6–20)
Bilirubin Total: 0.6 mg/dL (ref 0.0–1.2)
CO2: 22 mmol/L (ref 20–29)
Calcium: 9.4 mg/dL (ref 8.7–10.2)
Chloride: 99 mmol/L (ref 96–106)
Creatinine, Ser: 0.5 mg/dL — ABNORMAL LOW (ref 0.57–1.00)
Globulin, Total: 3.1 g/dL (ref 1.5–4.5)
Glucose: 76 mg/dL (ref 70–99)
Potassium: 3.7 mmol/L (ref 3.5–5.2)
Sodium: 135 mmol/L (ref 134–144)
Total Protein: 6.8 g/dL (ref 6.0–8.5)
eGFR: 129 mL/min/1.73 (ref >59)

## 2024-01-07 LAB — BILE ACIDS, TOTAL: Bile Acids Total: 5.1 umol/L (ref 0.0–10.0)

## 2024-01-08 ENCOUNTER — Encounter (HOSPITAL_COMMUNITY): Payer: Self-pay

## 2024-01-08 ENCOUNTER — Ambulatory Visit (HOSPITAL_COMMUNITY): Admitting: Mental Health

## 2024-01-08 ENCOUNTER — Ambulatory Visit: Payer: Self-pay | Admitting: Certified Nurse Midwife

## 2024-01-08 ENCOUNTER — Other Ambulatory Visit: Payer: Self-pay

## 2024-01-08 ENCOUNTER — Encounter (HOSPITAL_COMMUNITY): Payer: Self-pay | Admitting: Obstetrics & Gynecology

## 2024-01-08 ENCOUNTER — Inpatient Hospital Stay (HOSPITAL_COMMUNITY)
Admission: AD | Admit: 2024-01-08 | Discharge: 2024-01-08 | Disposition: A | Discharge status: Home / Self Care | Attending: Family Medicine | Admitting: Family Medicine

## 2024-01-08 DIAGNOSIS — Z3493 Encounter for supervision of normal pregnancy, unspecified, third trimester: Secondary | ICD-10-CM

## 2024-01-08 DIAGNOSIS — O219 Vomiting of pregnancy, unspecified: Secondary | ICD-10-CM

## 2024-01-08 DIAGNOSIS — Z3A35 35 weeks gestation of pregnancy: Secondary | ICD-10-CM | POA: Diagnosis not present

## 2024-01-08 DIAGNOSIS — K831 Obstruction of bile duct: Secondary | ICD-10-CM | POA: Insufficient documentation

## 2024-01-08 DIAGNOSIS — R1013 Epigastric pain: Secondary | ICD-10-CM | POA: Diagnosis present

## 2024-01-08 LAB — RESP PANEL BY RT-PCR (RSV, FLU A&B, COVID)  RVPGX2
Influenza A by PCR: NEGATIVE
Influenza B by PCR: NEGATIVE
Resp Syncytial Virus by PCR: NEGATIVE
SARS Coronavirus 2 by RT PCR: NEGATIVE

## 2024-01-08 LAB — URINALYSIS, ROUTINE W REFLEX MICROSCOPIC
Bilirubin Urine: NEGATIVE
Glucose, UA: NEGATIVE mg/dL
Hgb urine dipstick: NEGATIVE
Ketones, ur: NEGATIVE mg/dL
Leukocytes,Ua: NEGATIVE
Nitrite: NEGATIVE
Protein, ur: NEGATIVE mg/dL
Specific Gravity, Urine: 1.008 (ref 1.005–1.030)
pH: 6 (ref 5.0–8.0)

## 2024-01-08 NOTE — MAU Provider Note (Signed)
 History     CSN: [id number]  Arrival date and time: 01/08/24 1744 First Provider Initiated Contact with Patient 01/08/2024  6:42 PM  Chief Complaint  Patient presents with   Nausea   Hot flashes   Fatigue   epigastric pain   Dizziness    HPI Angela Sexton is a 33 y.o. H5E7987 at [redacted]w[redacted]d, 02/12/2024, by Last Menstrual Period, who presents to the Maternity Assessment Unit for feeling generally sick. She reports epigastric pain for about 2d that she initially thought was fetal movement. Today, also had sharp epigastric pain when coughing. She developed nausea w/o vomiting, dizziness, diaphoresis, and fatigue today. She was sent home from work for this reason. 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 She has not tried any medications at home. She reports a recent visit with her OB with workup for cholestasis.  ROS (+) nausea, fatigue, dizziness, wheezing, abd pain, FM (-) VB, LOF, ctx, f/c,   Medications Prior to Admission  Medication Sig Dispense Refill Last Dose/Taking   atomoxetine  (STRATTERA ) 18 MG capsule Take 1 capsule (18 mg total) by mouth daily. 30 capsule 3 01/08/2024   fluticasone  (FLONASE ) 50 MCG/ACT nasal spray Place 2 sprays into both nostrils daily. 32 mL 3 01/08/2024   Magnesium  Oxide -Mg Supplement (MAG-OXIDE) 200 MG TABS Take 2 tablets (400 mg total) by mouth at bedtime. If that amount causes loose stools in the am, switch to 200mg  daily at bedtime. 60 tablet 3 01/08/2024   Prenatal Vit-Fe Fumarate-FA (PRENATAL PLUS VITAMIN/MINERAL) 27-1 MG TABS Take 1 tablet by mouth daily. 30 tablet 11 01/08/2024   Acetaminophen -Caffeine  (EXCEDRIN TENSION HEADACHE PO) Take by mouth. (Patient not taking: Reported on 01/06/2024)      albuterol  (VENTOLIN  HFA) 108 (90 Base) MCG/ACT inhaler Inhale 2 puffs into the lungs every 6 (six) hours as needed for wheezing or shortness of breath. (Patient not taking: Reported on 01/06/2024) 8 g 2    aspirin  EC 81 MG tablet Take 1 tablet (81 mg  total) by mouth daily. Swallow whole. Do not begin taking until 07/31/23. (Patient not taking: Reported on 01/06/2024) 30 tablet 12    fluconazole  (DIFLUCAN ) 150 MG tablet Take 1 tablet (150 mg total) by mouth daily. (Patient not taking: Reported on 01/06/2024) 1 tablet 1    fluticasone -salmeterol (WIXELA INHUB) 250-50 MCG/ACT AEPB Inhale 1 puff into the lungs in the morning and at bedtime. Rinse and spit with water after each use (Patient not taking: Reported on 01/06/2024) 60 each 0    ursodiol  (ACTIGALL ) 300 MG capsule Take 1 capsule (300 mg total) by mouth 2 (two) times daily. 60 capsule 1     Past Medical History:  Diagnosis Date   Allergy    Anemia    Anxiety    Axillary lymphadenopathy 10/09/2020   B12 deficiency 11/09/2020   Bipolar 1 disorder (HCC) 11/26/2022   Bronchogenic cyst 04/21/2014   Formatting of this note might be different from the original.  17mm right  Formatting of this note might be different from the original.  Overview:   17mm right     Formatting of this note might be different from the original.  17mm right     Depression    Eczema    Elevated sed rate 11/09/2020   Facial rash 12/07/2020   Family history of scleroderma 05/08/2021   Folate deficiency 11/09/2020   Hematuria with proteinuria 11/09/2020   Hx of maternal laceration, 4th degree, currently pregnant 06/05/2021   Iron deficiency  anemia 11/09/2020   Joint pain 01/19/2021   Pneumonia    infant   Positive ANA (antinuclear antibody) 05/18/2021   Concern for Lupus   Elevated sed rate  Repeat ANA neg  dsDNA neg  SSA/SSB- Negative   Positive ANA (antinuclear antibody) 05/18/2021   Concern for Lupus   Elevated sed rate  Repeat ANA neg  dsDNA neg  SSA/SSB- Negative     Raynaud phenomenon 02/05/2021   Syphilis 05/09/2021   05-08-21  Positive RPR with titer of 1:32--> treated with bicillin  x3   [x]  6 month titer: 1:32 (inappropriate drop) Retreated [name]  x 3  [x]  12 month titer: 1:8 (appropriate drop)  [ ]  18  month titer (12 months after second treatment)          Past Surgical History:  Procedure Laterality Date   WISDOM TOOTH EXTRACTION       Allergies:  Allergies  Allergen Reactions   Sulfa Antibiotics     Nausea/vomiting, weakness    ROS reviewed and pertinent positives and negatives as documented in HPI.    Physical Exam  BP 115/75 (BP Location: Right Arm)   Pulse 93   Temp 98.7 F (37.1 C) (Oral)   Resp 19   Ht 5' 8 (1.727 m)   Wt 90.9 kg   LMP 05/08/2023 (Exact Date)   SpO2 100%   BMI 30.47 kg/m   Gen: alert, no acute distress CV: regular rate and rhythm Resp: nonlabored, no wheezing Abd: gravid  Cervical Exam  Not done  FHT Baseline: 130 bpm Variability: Good {> 6 bpm) Accelerations: Reactive Decelerations: Absent Uterine activity: inconsistent, activity captured  Labs A/Positive/-- (04/02 1059)   Results for orders placed or performed during the hospital encounter of 01/08/24 (from the past 24 hours)  Urinalysis, Routine w reflex microscopic -Urine, Clean Catch     Status: None   Collection Time: 01/08/24  7:23 PM  Result Value Ref Range   Color, Urine YELLOW YELLOW   APPearance CLEAR CLEAR   Specific Gravity, Urine 1.008 1.005 - 1.030   pH 6.0 5.0 - 8.0   Glucose, UA NEGATIVE NEGATIVE mg/dL   Hgb urine dipstick NEGATIVE NEGATIVE   Bilirubin Urine NEGATIVE NEGATIVE   Ketones, ur NEGATIVE NEGATIVE mg/dL   Protein, ur NEGATIVE NEGATIVE mg/dL   Nitrite NEGATIVE NEGATIVE   Leukocytes,Ua NEGATIVE NEGATIVE    Imaging No results found.   Assessment and Plan  MDM Venba Zenner is a 33 y.o. H5E7987 at [redacted]w[redacted]d, 02/12/2024, by Last Menstrual Period, who presents to the MAU for feeling ill.  Ddx: COVID/flu/RSV, dehydration, orthostasis. Reviewed outpatient labs, LFTs and bile acids WNL. On recheck, patient is sitting up in the bed eating fast food.   1. Encounter for supervision of low-risk pregnancy in third trimester  2. Vomiting or nausea of  pregnancy (Primary) - Urinalysis, Routine w reflex microscopic -Urine, Clean Catch; Standing - Urinalysis, Routine w reflex microscopic -Urine, Clean Catch - Resp panel by RT-PCR (RSV, Flu A&B, Covid) Anterior Nasal Swab; Standing - Airborne and Contact precautions; Standing - Resp panel by RT-PCR (RSV, Flu A&B, Covid) Anterior Nasal Swab - Airborne and Contact precautions - Discharge patient Discharge disposition: 01-Home or Self Care; Discharge patient date: 01/08/2024; Standing - Discharge patient Discharge disposition: 01-Home or Self Care; Discharge patient date: 01/08/2024  3. [redacted] weeks gestation of pregnancy   Results pending at the time of DC: viral swab Dispo: DC home in stable condition with return precautions discussed and included in AVS.  Her information was passed on to the night team to follow up. She has access to her MyChart for results.     Barabara Maier, DO FMOB Fellow, Faculty Practice [facility], Center for [employer]

## 2024-01-08 NOTE — Discharge Instructions (Addendum)
 Please return to the MAU (Maternity Assessment Unit) if you experience vaginal bleeding,leaking/gush of fluid like your water broke, notice decreased movement from your baby after doing kick counts, or contractions the are becoming more intense or more frequent.                     Safe Medications in Pregnancy    Acne: Benzoyl Peroxide Salicylic Acid  Backache/Headache: Tylenol : 2 regular strength every 4 hours OR              2 Extra strength every 6 hours  Colds/Coughs/Allergies: Benadryl  (alcohol free) 25 mg every 6 hours as needed Breath right strips Claritin Cepacol throat lozenges Chloraseptic throat spray Cold-Eeze- up to three times per day Cough drops, alcohol free Flonase  (by prescription only) Guaifenesin  Mucinex  Robitussin DM (plain only, alcohol free) Saline nasal spray/drops Sudafed (pseudoephedrine) & Actifed ** use only after [redacted] weeks gestation and if you do not have high blood pressure Tylenol  Vicks Vaporub Zinc lozenges Zyrtec   Constipation: Colace Ducolax suppositories Fleet enema Glycerin  suppositories Metamucil Milk of magnesia Miralax Senokot Smooth move tea  Diarrhea: Kaopectate Imodium A-D  *NO pepto Bismol  Hemorrhoids: Anusol Anusol HC Preparation H Tucks  Indigestion: Tums Maalox Mylanta Zantac  Pepcid   Insomnia: Benadryl  (alcohol free) 25mg  every 6 hours as needed Tylenol  PM Unisom , no Gelcaps  Leg Cramps: Tums MagGel  Nausea/Vomiting:  Bonine Dramamine Emetrol Ginger extract Sea bands Meclizine  Nausea medication to take during pregnancy:  Unisom  (doxylamine  succinate 25 mg tablets) Take one tablet daily at bedtime. If symptoms are not adequately controlled, the dose can be increased to a maximum recommended dose of two tablets daily (1/2 tablet in the morning, 1/2 tablet mid-afternoon and one at bedtime). Vitamin B6 100mg  tablets. Take one tablet twice a day (up to 200 mg per day).  Skin Rashes: Aveeno  products Benadryl  cream or 25mg  every 6 hours as needed Calamine Lotion 1% cortisone cream  Yeast infection: Gyne-lotrimin 7 Monistat 7   **If taking multiple medications, please check labels to avoid duplicating the same active ingredients **take medication as directed on the label ** Do not exceed 4000 mg of tylenol  in 24 hours **Do not take medications that contain aspirin  or ibuprofen      Fetal Movement Counts When you're pregnant, you might start feeling your baby move around the middle of your pregnancy. At first, these movements might feel like flutters, rolls, or swishes. As your baby grows, you might feel more kicks and jabs. Around week 28 of your pregnancy, your health care team may ask you to count how often your baby moves. This is important for all pregnancies, but especially for high-risk ones. Counting movements can help lessen the risk of stillbirth.  What is a fetal movement count? A fetal movement count is the number of times that you feel your baby move during a certain amount of time. This may also be called a kick count. There are many ways to do a kick count. Ask your team what is best for you. Pay attention to when your baby is most active. You may notice your baby's sleep and wake cycles. You may also notice things that make your baby move more. When you do a kick count, try to do it: When your baby is normally most active. At the same time each day.  How do I count fetal movements? Find a quiet, comfortable area. Sit or lie down. Write down the date, the start  time, and the number of movements you feel. Count kicks, flutters, swishes, rolls, and jabs. Usually, you will feel at least 10 movements within 2 hours. Stop counting after you have felt 10 movements or if you have been counting for 2 hours. Write down the stop time.  Contact a health care provider if:  You don't feel 10 movements in 2 hours. Your baby isn't moving as it usually does. Your baby  isn't moving at all. If you're not able to reach your provider, go to an emergency room. This information is not intended to replace advice given to you by your health care provider. Make sure you discuss any questions you have with your health care provider.  Document Revised: 05/01/2023 Document Reviewed: 04/23/2022 Elsevier Patient Education  2025 [employer].  Signs and Symptoms of Labor Labor is the body's natural process of moving the baby and the placenta out of the uterus. The process of labor usually starts when the baby is full-term, 37 weeks and 0 days or more.   As your body prepares for labor and the birth of your baby, you may notice the following symptoms in the weeks and days before true labor starts: Your baby dropping lower into your pelvis to get into position for birth (lightening). When this happens, you may feel more pressure on your bladder and pelvic bone and less pressure on your ribs. This may make it easier to breathe. It may also cause you to need to urinate more often and have problems with bowel movements. Having practice contractions, also called Braxton Hicks contractions or false labor. These occur at irregular (unevenly spaced) intervals that are more than 10 minutes apart. False labor contractions are common after exercise or sexual activity. They will stop if you change position, rest, or drink fluids. These contractions are usually mild and do not get stronger over time. They may feel like: A backache or back pain. Mild cramps, similar to menstrual cramps. Tightening or pressure in your abdomen. Passing a small amount of thick, bloody mucus from your vagina. This is called normal bloody show or losing your mucus plug. This may happen more than a week before labor begins, or right before labor begins, as the opening of the cervix starts to widen (dilate). For some women, the entire mucus plug passes at once. For others, pieces of the mucus plug may gradually  pass over several days.  Signs and symptoms that labor has begun Signs that you are in labor may include: Having contractions that come at regular (evenly spaced) intervals and increase in intensity. This may feel like more intense tightening or pressure in your abdomen that moves to your back. Feeling pressure in the vaginal area. Your water breaking (called rupture of membranes). This is when the sac of fluid that surrounds your baby breaks. Fluid leaking from your vagina may be clear or blood-tinged. Labor usually starts within 24 hours of your water breaking, but it may take longer to begin. Some people may feel a sudden gush of fluid; others may notice repeatedly damp underwear.  Go to the hospital when  Your water breaks. Your labor has started: painful, regular contractions that are 5 minutes apart or less. You have a fever. You have bright red blood coming from your vagina. You do not feel your baby moving. You have a severe headache with or without vision problems. You have chest pain or shortness of breath. These symptoms may represent a serious problem that is an emergency. Do  not wait to see if the symptoms will go away. Get medical help right away. Call your local emergency services (911 in the [place] Do not drive yourself to the hospital.  Summary Labor is your body's natural process of moving your baby and the placenta out of your uterus. The process of labor usually starts when your baby is full-term When labor starts, or if your water breaks, call your health care provider or nurse care line. Based on your situation, they will determine when you should go in for an exam. This information is not intended to replace advice given to you by your health care provider. Make sure you discuss any questions you have with your health care provider.  Document Revised: 08/21/2020 Document Reviewed: 08/21/2020-- adapted Elsevier Patient Education  2024 [employer].

## 2024-01-08 NOTE — MAU Note (Signed)
 Angela Sexton is a 33 y.o. at [redacted]w[redacted]d here in MAU reporting: she been feeling overall sick.  Reports she is nauseous, fatigued, having hot flashes, dizziness, and epigastric pain (thought pain was possibly bay's feet.)  States the epigastric pain has been going on a couple days, but the other symptoms began today. Denies VB or LOF.  Endorses +FM.    LMP: 05/08/2023 Onset of complaint: 2 days ago Pain score: 6 Vitals:   01/08/24 1848 01/08/24 1851  BP: (!) 88/69 115/75  Pulse: (!) 107 93  Resp:    Temp:    SpO2: 100% 100%     FHT: 147 bpm  Lab orders placed from triage: UA

## 2024-01-09 ENCOUNTER — Ambulatory Visit: Payer: Self-pay

## 2024-01-11 ENCOUNTER — Telehealth: Payer: Self-pay

## 2024-01-11 NOTE — Telephone Encounter (Signed)
 PT left voicemail last Friday requesting call back due to feeling sick, sweaty, and clammy.  Per chart review pt received treatment at MAU Friday evening.   [place], RN

## 2024-01-19 ENCOUNTER — Other Ambulatory Visit: Payer: Self-pay

## 2024-01-19 DIAGNOSIS — K831 Obstruction of bile duct: Secondary | ICD-10-CM

## 2024-01-20 ENCOUNTER — Ambulatory Visit (INDEPENDENT_AMBULATORY_CARE_PROVIDER_SITE_OTHER)

## 2024-01-20 ENCOUNTER — Other Ambulatory Visit: Payer: Self-pay

## 2024-01-20 ENCOUNTER — Ambulatory Visit: Admitting: Certified Nurse Midwife

## 2024-01-20 ENCOUNTER — Other Ambulatory Visit (HOSPITAL_COMMUNITY)
Admission: RE | Admit: 2024-01-20 | Discharge: 2024-01-20 | Disposition: A | Discharge status: Home / Self Care | Source: Ambulatory Visit | Attending: Certified Nurse Midwife | Admitting: Certified Nurse Midwife

## 2024-01-20 VITALS — BP 122/86 | HR 86 | Wt 206.8 lb

## 2024-01-20 DIAGNOSIS — O26613 Liver and biliary tract disorders in pregnancy, third trimester: Secondary | ICD-10-CM

## 2024-01-20 DIAGNOSIS — Z3A36 36 weeks gestation of pregnancy: Secondary | ICD-10-CM

## 2024-01-20 DIAGNOSIS — Z3493 Encounter for supervision of normal pregnancy, unspecified, third trimester: Secondary | ICD-10-CM | POA: Insufficient documentation

## 2024-01-20 DIAGNOSIS — K831 Obstruction of bile duct: Secondary | ICD-10-CM

## 2024-01-20 DIAGNOSIS — O26643 Intrahepatic cholestasis of pregnancy, third trimester: Secondary | ICD-10-CM

## 2024-01-20 NOTE — Progress Notes (Signed)
   PRENATAL VISIT NOTE  Subjective:  Angela Sexton is a 33 y.o. H5E7987 at [redacted]w[redacted]d being seen today for ongoing prenatal care.  She is currently monitored for the following issues for this high-risk pregnancy and has Supervision of low-risk pregnancy; History of syphilis; Bipolar II disorder (HCC); Generalized anxiety disorder; and Cholestasis (HCC) on their problem list.  Patient reports itching has decreased since last visit, but still notices itching on top of feet.  Contractions: Not present. Vag. Bleeding: None.  Movement: Present. Denies leaking of fluid.   The following portions of the patient's history were reviewed and updated as appropriate: allergies, current medications, past family history, past medical history, past social history, past surgical history and problem list.   Objective:   Vitals:   01/20/24 1543  BP: 122/86  Pulse: 86  Weight: 206 lb 12.8 oz (93.8 kg)   Fetal Status:  Fetal Heart Rate (bpm): 135 Fundal Height: 36 cm Movement: Present    General: Alert, oriented and cooperative. Patient is in no acute distress.  Skin: Skin is warm and dry. No rash noted.   Cardiovascular: Normal heart rate noted  Respiratory: Normal respiratory effort, no problems with respiration noted  Abdomen: Soft, gravid, appropriate for gestational age.  Pain/Pressure: Absent     Pelvic: Cervical exam deferred        Extremities: Normal range of motion.  Edema: Mild pitting, slight indentation  Mental Status: Normal mood and affect. Normal behavior. Normal judgment and thought content.   Assessment and Plan:  Pregnancy: [id number] at [redacted]w[redacted]d 1. Encounter for supervision of low-risk pregnancy in third trimester (Primary) - Doing well, feeling regular and vigorous fetal movement  - Cervicovaginal ancillary only( [place]) - Culture, beta strep (group b only)  2. [redacted] weeks gestation of pregnancy - Routine OB care   3. Cholestasis of pregnancy, antepartum, third trimester  - Patient  taking Ursodiol  daily - Scheduled for medical induction on 01/22/2024 at midnight  Term labor symptoms and general obstetric precautions including but not limited to vaginal bleeding, contractions, leaking of fluid and fetal movement were reviewed in detail with the patient. Please refer to After Visit Summary for other counseling recommendations.     Future Appointments  Date Time Provider Department Center  01/21/2024  3:00 PM Ty Bernice RAMAN [facility] None  01/22/2024 12:00 AM [facility] SCHED ROOM [facility] None  01/27/2024  2:15 PM Vannie Cornell JONELLE EDDY [facility]  02/03/2024  3:55 PM Vannie Cornell JONELLE EDDY [facility]  02/10/2024  3:15 PM Vannie Cornell JONELLE EDDY [facility]  02/17/2024  2:35 PM Vannie Cornell JONELLE, CNM [facility]    Tommy Daring, NP-S

## 2024-01-21 ENCOUNTER — Encounter (HOSPITAL_COMMUNITY): Payer: Self-pay

## 2024-01-21 ENCOUNTER — Ambulatory Visit (HOSPITAL_COMMUNITY): Admitting: Mental Health

## 2024-01-21 DIAGNOSIS — F411 Generalized anxiety disorder: Secondary | ICD-10-CM

## 2024-01-21 DIAGNOSIS — F3181 Bipolar II disorder: Secondary | ICD-10-CM | POA: Diagnosis not present

## 2024-01-21 NOTE — Progress Notes (Signed)
 THERAPIST PROGRESS NOTE Virtual Visit via Video Note  I connected with Angela Sexton on 01/21/2024 at  3:00 PM EDT by a video enabled telemedicine application and verified that I am speaking with the correct person using two identifiers.  Location: Patient: parking lot of work- [employer] [place] Provider: office   I discussed the limitations of evaluation and management by telemedicine and the availability of in person appointments. The patient expressed understanding and agreed to proceed.  I discussed the assessment and treatment plan with the patient. The patient was provided an opportunity to ask questions and all were answered. The patient agreed with the plan and demonstrated an understanding of the instructions.   The patient was advised to call back or seek an in-person evaluation if the symptoms worsen or if the condition fails to improve as anticipated.  I provided 33 minutes of non-face-to-face time during this encounter.   Angela Sexton, [facility]   Session Time: 3:05 pm   Participation Level: Active  Behavioral Response: CasualAlertWNL  Type of Therapy: Individual Therapy  Treatment Goals addressed: Stabilizing my moods. Angela Sexton will regulate moods AEB development of x 3 effective emotional regulation skills and x 3 distress tolerance skills within the next 90 days.   ProgressTowards Goals: Initial  Interventions: CBT and Supportive  Summary: Angela Sexton is a 33 y.o. female who presents with dx of bipolarII disorder and generalized anxiety disorder. Presents for session sitting in car, alert and oriented; mood and affect stable. Speech clear and coherent at normal rate and tone. Shares to have just left work and for today to be the last day prior to going on maternity leave. Shares to be being induced into labor for brith of her 3rd daughter. Shares thoughts and feelings on last day and for students and others to have supported. Shares hx of post partum  depression and hx of fluctuating moods in which she can e come easily irritable, easily angered,  I act first and think later Shares not currently taking medications and will explore following the birth of daughter. Shares periods of crying and then will become excited. Shares thoughts on having x 6 weeks of maternity leave but would like to return in February. Agrees to treatment plan to work to regulate moods; denies safety concerns.    Suicidal/Homicidal: Nowithout intent/plan  Therapist Response: Therapist engaged Angela Sexton in tele-therapy session. Completed check in and assessed for current level of functioning, sxs management and level of stressors. Reviewed intake session, informed consent and bounds of confidentiality. Explred current concerns and thoughts and feelings on upcoming birth of daughter. Explores hx of post partum depression, supports present and ability to re-engage in medication management with [facility]. Explored treatment planning and areas of concern with instability in moods. Reviewed hx of previously developed coping skills and reviewed STOP skill. Reviewed session and provided follow up.   Plan: Return again in  x 5 weeks.  Diagnosis: Bipolar II disorder (HCC)  Generalized anxiety disorder  Collaboration of Care: Other None  Patient/Guardian was advised Release of Information must be obtained prior to any record release in order to collaborate their care with an outside provider. Patient/Guardian was advised if they have not already done so to contact the registration department to sign all necessary forms in order for us  to release information regarding their care.   Consent: Patient/Guardian gives verbal consent for treatment and assignment of benefits for services provided during this visit. Patient/Guardian expressed understanding and agreed to proceed.   Angela,  Angela Sexton, [facility] 01/21/2024

## 2024-01-22 ENCOUNTER — Inpatient Hospital Stay (HOSPITAL_COMMUNITY)
Admission: AD | Admit: 2024-01-22 | Discharge: 2024-01-25 | DRG: 807 | Disposition: A | Payer: Self-pay | Attending: Obstetrics and Gynecology | Admitting: Obstetrics and Gynecology

## 2024-01-22 ENCOUNTER — Inpatient Hospital Stay (HOSPITAL_COMMUNITY)

## 2024-01-22 ENCOUNTER — Encounter (HOSPITAL_COMMUNITY): Payer: Self-pay | Admitting: Family Medicine

## 2024-01-22 ENCOUNTER — Other Ambulatory Visit: Payer: Self-pay

## 2024-01-22 DIAGNOSIS — O9902 Anemia complicating childbirth: Secondary | ICD-10-CM | POA: Diagnosis present

## 2024-01-22 DIAGNOSIS — O26643 Intrahepatic cholestasis of pregnancy, third trimester: Secondary | ICD-10-CM | POA: Diagnosis present

## 2024-01-22 DIAGNOSIS — O99344 Other mental disorders complicating childbirth: Secondary | ICD-10-CM | POA: Diagnosis not present

## 2024-01-22 DIAGNOSIS — Z833 Family history of diabetes mellitus: Secondary | ICD-10-CM | POA: Diagnosis not present

## 2024-01-22 DIAGNOSIS — Z3A37 37 weeks gestation of pregnancy: Secondary | ICD-10-CM | POA: Diagnosis not present

## 2024-01-22 DIAGNOSIS — K831 Obstruction of bile duct: Secondary | ICD-10-CM | POA: Diagnosis present

## 2024-01-22 DIAGNOSIS — O2662 Liver and biliary tract disorders in childbirth: Secondary | ICD-10-CM | POA: Diagnosis not present

## 2024-01-22 DIAGNOSIS — O99824 Streptococcus B carrier state complicating childbirth: Secondary | ICD-10-CM | POA: Diagnosis present

## 2024-01-22 DIAGNOSIS — Z3A36 36 weeks gestation of pregnancy: Principal | ICD-10-CM

## 2024-01-22 LAB — COMPREHENSIVE METABOLIC PANEL WITH GFR
ALT: 17 U/L (ref 0–44)
AST: 21 U/L (ref 15–41)
Albumin: 2.9 g/dL — ABNORMAL LOW (ref 3.5–5.0)
Alkaline Phosphatase: 111 U/L (ref 38–126)
Anion gap: 8 (ref 5–15)
BUN: 5 mg/dL — ABNORMAL LOW (ref 6–20)
CO2: 22 mmol/L (ref 22–32)
Calcium: 8.5 mg/dL — ABNORMAL LOW (ref 8.9–10.3)
Chloride: 106 mmol/L (ref 98–111)
Creatinine, Ser: 0.44 mg/dL (ref 0.44–1.00)
GFR, Estimated: >60 mL/min (ref >60)
Glucose, Bld: 77 mg/dL (ref 70–99)
Potassium: 3.5 mmol/L (ref 3.5–5.1)
Sodium: 136 mmol/L (ref 135–145)
Total Bilirubin: 1.1 mg/dL (ref 0.0–1.2)
Total Protein: 6.9 g/dL (ref 6.5–8.1)

## 2024-01-22 LAB — RPR
RPR Ser Ql: REACTIVE — AB
RPR Titer: 1:8 {titer}

## 2024-01-22 LAB — CBC
HCT: 34.5 % — ABNORMAL LOW (ref 36.0–46.0)
Hemoglobin: 11.2 g/dL — ABNORMAL LOW (ref 12.0–15.0)
MCH: 28.7 pg (ref 26.0–34.0)
MCHC: 32.5 g/dL (ref 30.0–36.0)
MCV: 88.5 fL (ref 80.0–100.0)
Platelets: 232 K/uL (ref 150–400)
RBC: 3.9 MIL/uL (ref 3.87–5.11)
RDW: 13.7 % (ref 11.5–15.5)
WBC: 8 K/uL (ref 4.0–10.5)
nRBC: 0 % (ref 0.0–0.2)

## 2024-01-22 LAB — TYPE AND SCREEN
ABO/RH(D): A POS
Antibody Screen: NEGATIVE

## 2024-01-22 LAB — CERVICOVAGINAL ANCILLARY ONLY
Chlamydia: NEGATIVE
Comment: NEGATIVE
Comment: NORMAL
Neisseria Gonorrhea: NEGATIVE

## 2024-01-22 MED ORDER — LACTATED RINGERS IV SOLN: INTRAVENOUS | Status: DC

## 2024-01-22 MED ORDER — ONDANSETRON HCL 4 MG/2ML IJ SOLN: 4.0000 mg | Freq: Four times a day (QID) | INTRAMUSCULAR | Status: DC | PRN

## 2024-01-22 MED ORDER — OXYTOCIN-SODIUM CHLORIDE 30-0.9 UT/500ML-% IV SOLN
1.0000 m[IU]/min | INTRAVENOUS | Status: DC
  Administered 2024-01-22: 1 m[IU]/min via INTRAVENOUS

## 2024-01-22 MED ORDER — OXYTOCIN-SODIUM CHLORIDE 30-0.9 UT/500ML-% IV SOLN
2.5000 [IU]/h | INTRAVENOUS | Status: DC
  Filled 2024-01-22: qty 500

## 2024-01-22 MED ORDER — TERBUTALINE SULFATE 1 MG/ML IJ SOLN
0.2500 mg | Freq: Once | INTRAMUSCULAR | Status: AC | PRN
Start: 2024-01-22 — End: 2024-01-22
  Administered 2024-01-22: 0.25 mg via SUBCUTANEOUS
  Filled 2024-01-22: qty 1

## 2024-01-22 MED ORDER — LACTATED RINGERS IV SOLN
500.0000 mL | INTRAVENOUS | Status: AC | PRN
  Administered 2024-01-22 – 2024-01-23 (×4): 500 mL via INTRAVENOUS

## 2024-01-22 MED ORDER — TERBUTALINE SULFATE 1 MG/ML IJ SOLN
0.2500 mg | Freq: Once | INTRAMUSCULAR | Status: AC | PRN
  Administered 2024-01-23: 0.25 mg via SUBCUTANEOUS

## 2024-01-22 MED ORDER — MISOPROSTOL 50MCG HALF TABLET
50.0000 ug | ORAL_TABLET | ORAL | Status: DC | PRN
  Administered 2024-01-22: 50 ug via BUCCAL
  Filled 2024-01-22: qty 1

## 2024-01-22 MED ORDER — OXYTOCIN BOLUS FROM INFUSION
333.0000 mL | Freq: Once | INTRAVENOUS | Status: AC
  Administered 2024-01-23: 333 mL via INTRAVENOUS

## 2024-01-22 MED ORDER — ACETAMINOPHEN 325 MG PO TABS: 650.0000 mg | ORAL_TABLET | ORAL | Status: DC | PRN

## 2024-01-22 MED ORDER — LIDOCAINE HCL (PF) 1 % IJ SOLN: 30.0000 mL | INTRAMUSCULAR | Status: DC | PRN

## 2024-01-22 MED ORDER — OXYCODONE-ACETAMINOPHEN 5-325 MG PO TABS: 2.0000 | ORAL_TABLET | ORAL | Status: DC | PRN

## 2024-01-22 MED ORDER — FENTANYL CITRATE (PF) 100 MCG/2ML IJ SOLN
50.0000 ug | INTRAMUSCULAR | Status: DC | PRN
  Administered 2024-01-23: 100 ug via INTRAVENOUS
  Administered 2024-01-23 (×2): 50 ug via INTRAVENOUS
  Filled 2024-01-22 (×2): qty 2

## 2024-01-22 MED ORDER — FLEET ENEMA RE ENEM: 1.0000 | ENEMA | RECTAL | Status: DC | PRN

## 2024-01-22 MED ORDER — SOD CITRATE-CITRIC ACID 500-334 MG/5ML PO SOLN: 30.0000 mL | ORAL | Status: DC | PRN

## 2024-01-22 MED ORDER — OXYCODONE-ACETAMINOPHEN 5-325 MG PO TABS: 1.0000 | ORAL_TABLET | ORAL | Status: DC | PRN

## 2024-01-22 NOTE — H&P (Signed)
 OBSTETRIC ADMISSION HISTORY AND PHYSICAL  Angela Sexton is a 33 y.o. female [phone] with IUP at [redacted]w[redacted]d by LMP presenting for IOL due to cholestasis. She reports fetal movement. No LOF, vaginal bleeding, vision changes, or headaches. She plans on breast and bottle feeding. She requests oral contraception for birth control. She received her prenatal care at [facility].  Dating: By LMP --->  Estimated Date of Delivery: 02/12/24  Sono: @[redacted]w[redacted]d , CWD, normal anatomy,  390g, EFW 59%  NURSING  PROVIDER  Conservator, [facility] Dating by LMP confirmed by US  at [redacted]w[redacted]d  [facility] Model Traditional Anatomy U/S   Initiated care at  [employer]                Language  English              LAB RESULTS   Support Person Cordella Soil scientist) and Mom  Genetics NIPS: LR female AFP:     NT/IT (FT only)     Carrier Screen Horizon: 4/4 negative (05/08/21)  Rhogam  A/Positive/-- (04/02 1059) A1C/GTT Early HgbA1C: 4.9 Third trimester 2 hr GTT: 21,884,03   Flu Vaccine Declined 01/20/24    TDaP Vaccine Declined 01/06/24 Blood Type A/Positive/-- (04/02 1059)  RSV Vaccine  Antibody Negative (04/02 1059)  COVID Vaccine  Rubella 7.15 (04/02 1059)  Feeding Plan both RPR Reactive (04/02 1059)  Contraception oral contraceptives (estrogen/progesterone) HBsAg Negative (04/02 1059)  Circumcision If boy, Yes  HIV Non Reactive (04/02 1059)  Pediatrician  [facility]  HCVAb Non Reactive (04/02 1059)  Prenatal Classes     BTL Consent NA Pap Diagnosis  Date Value Ref Range Status  07/22/2023   Final   - Negative for intraepithelial lesion or malignancy (NILM)    BTL Pre-payment NA GC/CT Initial: neg (08/19/23) 36wks:    VBAC Consent NA GBS   For PCN allergy, check sensitivities   BRx Optimized? [ ]  yes   [x]  no    DME Rx [ ]  BP cuff [ ]  Weight Scale Waterbirth  [ ]  Class [ ]  Consent [ ]  CNM visit  PHQ9 & GAD7 [x]  new OB [x]  28 weeks  [x]  36 weeks Induction  [ ]  Orders Entered [ ] Foley Y/N     Prenatal  History/Complications: cholestasis  Past Medical History: Past Medical History:  Diagnosis Date   Allergy    Anemia    Anxiety    Axillary lymphadenopathy 10/09/2020   B12 deficiency 11/09/2020   Bipolar 1 disorder (HCC) 11/26/2022   Bronchogenic cyst 04/21/2014   Formatting of this note might be different from the original.  17mm right  Formatting of this note might be different from the original.  Overview:   17mm right     Formatting of this note might be different from the original.  17mm right     Depression    Eczema    Elevated sed rate 11/09/2020   Facial rash 12/07/2020   Family history of scleroderma 05/08/2021   Folate deficiency 11/09/2020   Hematuria with proteinuria 11/09/2020   Hx of maternal laceration, 4th degree, currently pregnant 06/05/2021   Iron deficiency anemia 11/09/2020   Joint pain 01/19/2021   Pneumonia    infant   Positive ANA (antinuclear antibody) 05/18/2021   Concern for Lupus   Elevated sed rate  Repeat ANA neg  dsDNA neg  SSA/SSB- Negative   Positive ANA (antinuclear antibody) 05/18/2021   Concern for Lupus   Elevated sed rate  Repeat ANA neg  dsDNA  neg  SSA/SSB- Negative     Raynaud phenomenon 02/05/2021   Syphilis 05/09/2021   05-08-21  Positive RPR with titer of 1:32--> treated with bicillin  x3   [x]  6 month titer: 1:32 (inappropriate drop) Retreated [name]  x 3  [x]  12 month titer: 1:8 (appropriate drop)  [ ]  18 month titer (12 months after second treatment)         Past Surgical History: Past Surgical History:  Procedure Laterality Date   WISDOM TOOTH EXTRACTION     Obstetrical History: OB History     Gravida  4   Para  2   Term  2   Preterm  0   AB  1   Living  2      SAB  1   IAB  0   Ectopic  0   Multiple  0   Live Births  2          Social History Social History   Socioeconomic History   Marital status: Single    Spouse name: Not on file   Number of children: Not on file   Years of education: Not on  file   Highest education level: Bachelor's degree (e.g., BA, AB, BS)  Occupational History   Not on file  Tobacco Use   Smoking status: Never   Smokeless tobacco: Never  Vaping Use   Vaping status: Never Used  Substance and Sexual Activity   Alcohol use: Yes    Comment: occ   Drug use: Never   Sexual activity: Yes    Birth control/protection: None  Other Topics Concern   Not on file  Social History Narrative   Not on file   Social Drivers of Health   Financial Resource Strain: High Risk (04/20/2023)   Overall Financial Resource Strain (CARDIA)    Difficulty of Paying Living Expenses: Hard  Food Insecurity: Food Insecurity Present (01/22/2024)   Hunger Vital Sign    Worried About Running Out of Food in the Last Year: Sometimes true    Ran Out of Food in the Last Year: Sometimes true  Transportation Needs: No Transportation Needs (01/22/2024)   PRAPARE - Administrator, Civil Service (Medical): No    Lack of Transportation (Non-Medical): No  Physical Activity: Insufficiently Active (04/20/2023)   Exercise Vital Sign    Days of Exercise per Week: 2 days    Minutes of Exercise per Session: 40 min  Stress: Stress Concern Present (04/20/2023)   [employer] of Occupational Health - Occupational Stress Questionnaire    Feeling of Stress : Rather much  Social Connections: Moderately Integrated (01/22/2024)   Social Connection and Isolation Panel    Frequency of Communication with Friends and Family: More than three times a week    Frequency of Social Gatherings with Friends and Family: Once a week    Attends Religious Services: More than 4 times per year    Active Member of [employer] or Organizations: No    Attends Engineer, structural: Never    Marital Status: Living with partner   Family History: Family History  Problem Relation Age of Onset   Asthma Mother    Eczema Mother    Scleroderma Father    Diabetes Father    Melanoma Maternal Grandfather     Lung disease Neg Hx    Colon cancer Neg Hx    Esophageal cancer Neg Hx    Allergies: Allergies  Allergen Reactions   Sulfa Antibiotics  Nausea/vomiting, weakness   Medications Prior to Admission  Medication Sig Dispense Refill Last Dose/Taking   ursodiol  (ACTIGALL ) 300 MG capsule Take 1 capsule (300 mg total) by mouth 2 (two) times daily. 60 capsule 1 01/21/2024 at  8:00 PM   Acetaminophen -Caffeine  (EXCEDRIN TENSION HEADACHE PO) Take by mouth. (Patient not taking: Reported on 01/20/2024)      aspirin  EC 81 MG tablet Take 1 tablet (81 mg total) by mouth daily. Swallow whole. Do not begin taking until 07/31/23. (Patient not taking: Reported on 01/20/2024) 30 tablet 12    atomoxetine  (STRATTERA ) 18 MG capsule Take 1 capsule (18 mg total) by mouth daily. 30 capsule 3 More than a month   fluticasone  (FLONASE ) 50 MCG/ACT nasal spray Place 2 sprays into both nostrils daily. 32 mL 3 More than a month   Magnesium  Oxide -Mg Supplement (MAG-OXIDE) 200 MG TABS Take 2 tablets (400 mg total) by mouth at bedtime. If that amount causes loose stools in the am, switch to 200mg  daily at bedtime. 60 tablet 3 More than a month   Prenatal Vit-Fe Fumarate-FA (PRENATAL PLUS VITAMIN/MINERAL) 27-1 MG TABS Take 1 tablet by mouth daily. 30 tablet 11 More than a month    Review of Systems   All systems reviewed and negative except as stated in HPI  Blood pressure 115/71, pulse 77, temperature 97.8 F (36.6 C), temperature source Oral, resp. rate 16, last menstrual period 05/08/2023, SpO2 99%. General appearance: alert and cooperative Lungs: normal respiratory rate and effort Heart: regular rate Abdomen: soft, non-tender Pelvic:  Dilation: Fingertip Effacement (%): 50 Station: -3 Exam by:: Tommy Daring, RN Extremities: 1+ pitting edema BLL Presentation: vertex by US  in MAU  Fetal Tracing: Cat 1 Baseline: 140 Variability: moderate Accelerations: 15x15 Decelerations: none Toco: UI  Prenatal  labs: ABO, Rh: --/--/PENDING (10/03 0736) Antibody: PENDING (10/03 0736) Rubella: 7.15 (04/02 1059) RPR: Reactive (07/30 0919)  HBsAg: Negative (04/02 1059)  HIV: Non Reactive (07/30 0919)  GBS: pending  GTT 21,884,03 (11/18/23) Genetic screening: NIPS LR female (07/22/23) Anatomy US : normal (09/30/23)  Immunization History  Administered Date(s) Administered   DTaP 01/16/1993, 05/01/1993, 07/10/1993, 05/01/1994, 05/14/1994, 01/17/1995, 11/22/1996   HIB (PRP-T) 01/16/1993, 05/01/1993, 07/15/1993, 05/14/1994   HIB, Unspecified 01/16/1993, 05/01/1993, 07/15/1993, 01/16/1994, 05/14/1994   Hepatitis A, Ped/Adol-2 Dose 08/08/2010   Hepatitis B, PED/ADOLESCENT 11/27/1992, 01/16/1993, 11/20/1993   IPV 01/16/1993, 05/01/1993, 07/10/1993, 05/14/1994   MMR 11/20/1993, 11/22/1996   Meningococcal Acwy, Unspecified 05/19/2007   Meningococcal Conjugate 05/19/2007   PFIZER Comirnaty(Gray Top)Covid-19 Tri-Sucrose Vaccine 06/28/2019, 07/29/2019   PFIZER(Purple Top)SARS-COV-2 Vaccination 06/28/2019, 07/29/2019   Tdap 01/06/2005, 08/25/2021   Prenatal Transfer Tool  Maternal Diabetes: No Genetic Screening: Normal Maternal Ultrasounds/Referrals: Normal Fetal Ultrasounds or other Referrals:  Referred to Materal Fetal Medicine  Maternal Substance Abuse:  No Significant Maternal Medications:  None Significant Maternal Lab Results: GBS pending Number of Prenatal Visits:greater than 3 verified prenatal visits Maternal Vaccinations: none Other Comments: none  Results for orders placed or performed during the hospital encounter of 01/22/24 (from the past 24 hours)  CBC   Collection Time: 01/22/24  7:36 AM  Result Value Ref Range   WBC 8.0 4.0 - 10.5 K/uL   RBC 3.90 3.87 - 5.11 MIL/uL   Hemoglobin 11.2 (L) 12.0 - 15.0 g/dL   HCT 65.4 (L) 63.9 - 53.9 %   MCV 88.5 80.0 - 100.0 fL   MCH 28.7 26.0 - 34.0 pg   MCHC 32.5 30.0 - 36.0 g/dL   RDW 86.2 88.4 -  15.5 %   Platelets 232 150 - 400 K/uL   nRBC  0.0 0.0 - 0.2 %  Type and screen   Collection Time: 01/22/24  7:36 AM  Result Value Ref Range   ABO/RH(D) PENDING    Antibody Screen PENDING    Sample Expiration      01/25/2024,2359 Performed at [facility], [address] [place], [place] [address]    Patient Active Problem List   Diagnosis Date Noted   Indication for care in labor or delivery 01/22/2024   Cholestasis (HCC) 01/08/2024   Bipolar II disorder (HCC) 11/20/2023   Generalized anxiety disorder 11/20/2023   Supervision of low-risk pregnancy 07/21/2023   History of syphilis 07/21/2023   Assessment/Plan:  Angela Sexton is a 33 y.o. H5E7987 at [redacted]w[redacted]d here for IOL due to cholestasis.  #Labor: s/p Cytotec  at 0806 #Pain: hoping to labor without epidural, open to nitrous or epidural if needed #FWB: Cat 1 #GBS status: pending #Feeding: both #Reproductive Life planning: Progesterone only pills #Circ: not applicable  Shortly after Cytotec  administration fetal tracing became Cat 2 due to late decels. SNM and CNM to bedside to evaluate. See Cornell Finder, CNM note.  Vernell FORBES Ruddle, Student-MidWife  01/22/2024, 8:30 AM

## 2024-01-22 NOTE — Progress Notes (Signed)
 Patient ID: Angela Sexton, female   DOB: 1990/12/24, 33 y.o.   MRN: [id number]  BP 113/76  79  Temp 97.9 F (Oral)  Resp 16  Dilation: Fingertip Effacement (%): 50 Cervical Position: Posterior Station: -3 Presentation: Vertex Exam by:: Vernell, Student Midwife  S/p Cytotec  at 7 AM followed by terbutaline  due to FHR Cat 2. Has continued to contract regularly throughout the morning; UC q 2-4 minutes. Open to Foley balloon placement. Cervical exam very uncomfortable for patient, unable to fully assess internal os. Cervix is posterior and to patient's left. Plan to start Pitocin  2x2 and titrate to 6 mu/min then hold. Encouraged upright positioning and ambulation. FHR Cat 1 over past hour. Will reassess SVE to determine need for Foley balloon vs continued titration of Pitocin .  Vernell Ruddle, SNM 01/22/2024 1240

## 2024-01-22 NOTE — Progress Notes (Signed)
 Patient ID: Angela Sexton, female   DOB: March 26, 1991, 33 y.o.   MRN: [id number]  Labor Progress Note Angela Sexton is a 33 y.o. H5E7987 at [redacted]w[redacted]d presented for IOL for cholestasis  S:  Pt resting comfortably in bed, having intense cramping after dual cytotec  but tolerating the cramping well.   O:  BP 115/71   Pulse 77   Temp 97.8 F (36.6 C) (Oral)   Resp 16   LMP 05/08/2023 (Exact Date)   SpO2 99%  EFM: baseline 130 bpm/ moderate variability/ 15x15 accels/ recurrent deep decels  Toco/IUPC: q1-44min SVE: Dilation: Fingertip Effacement (%): 50 Cervical Position: Posterior Station: -3 Presentation: Vertex Exam by:: Tommy Daring, RN Pitocin : None   A/P: 33 y.o. H5E7987 [redacted]w[redacted]d  1. Labor: Latent but uterine tachysystole from dual cytotec . 2. FWB: Cat 2, recovered to Cat 1 3. Pain: Tolerating well 4. Cholestasis: LFTs normal  Prolonged decel noted - position changed twice, bolus started, terb given. Baby recovered well into the 130s with good variability and smaller decels during contractions, then back to a Cat 1 tracing. Explained hyperstimulation to pt, and progress of interventions to calm her uterus down and allow baby to recover. Expressed understanding. Will allow baby to recover and assess for pitocin /FB in 4 hrs.   Cornell JONELLE Finder, CNM 10:17 AM

## 2024-01-22 NOTE — Progress Notes (Signed)
 Patient ID: Angela Sexton, female   DOB: 10/10/1990, 33 y.o.   MRN: [id number]  BP 113/72   Pulse 80   Temp 98 F (36.7 C) (Oral)   Resp 16   LMP 05/08/2023 (Exact Date)   SpO2 99%   Dilation: 1.5 Effacement (%): 50 Cervical Position: Posterior Station: -3 Presentation: Vertex Exam by:: Vernell, SNM  Patient has been up walking in halls. Feeling abdominal cramping. Toco shows irregular contractions 3-5 minutes apart. Toco does not trace well while patient is walking. Pitocin  infusing at 6 mu/min. Discussed Foley balloon placement. Balloon placed using speculum without difficulty. Inflated to 50cc. Will plan to continue Pitocin  at 6 mu/min and recheck SVE when balloon is expelled. FHR Cat 1.   Vernell Ruddle, SNM 01/22/2024 6:12 PM

## 2024-01-22 NOTE — Progress Notes (Signed)
 Patient ID: Angela Sexton, female   DOB: [birth year], 33 y.o.   MRN: [id number]  Patient Vitals for the past 4 hrs:  BP Temp Temp src Pulse  01/22/24 1958 113/81 -- -- 82  01/22/24 1957 -- 97.9 F (36.6 C) Oral --  01/22/24 1920 116/88 -- -- 79  01/22/24 1727 113/72 -- -- 80   Dilation: 4 Effacement (%): 50 Cervical Position: Posterior Station: -3 Presentation: Vertex Exam by:: Vernell , SCNM  Foley balloon expelled. Patient up sitting on ball in room. Discussed AROM. Fetal head well applied to cervix. AROM performed with amnihook. Scant clear fluid returned. FHR remained Cat 1 throughout and following AROM. Patient to watch for leaking fluid. Plan to titrate Pitocin  that is currently infusing at 6 mu/min. UC q 2-5 min apart.  Vernell Ruddle, SNM 01/22/2024 9:02 PM

## 2024-01-23 ENCOUNTER — Inpatient Hospital Stay (HOSPITAL_COMMUNITY): Admitting: Anesthesiology

## 2024-01-23 ENCOUNTER — Encounter (HOSPITAL_COMMUNITY): Payer: Self-pay | Admitting: Family Medicine

## 2024-01-23 DIAGNOSIS — O2662 Liver and biliary tract disorders in childbirth: Secondary | ICD-10-CM | POA: Diagnosis not present

## 2024-01-23 DIAGNOSIS — Z3A37 37 weeks gestation of pregnancy: Secondary | ICD-10-CM | POA: Diagnosis not present

## 2024-01-23 DIAGNOSIS — O99344 Other mental disorders complicating childbirth: Secondary | ICD-10-CM | POA: Diagnosis not present

## 2024-01-23 LAB — CULTURE, BETA STREP (GROUP B ONLY): Strep Gp B Culture: POSITIVE — AB

## 2024-01-23 MED ORDER — WITCH HAZEL-GLYCERIN EX PADS: 1.0000 | MEDICATED_PAD | CUTANEOUS | Status: DC | PRN

## 2024-01-23 MED ORDER — COCONUT OIL OIL: 1.0000 | TOPICAL_OIL | Status: DC | PRN

## 2024-01-23 MED ORDER — EPHEDRINE 5 MG/ML INJ: 10.0000 mg | INTRAVENOUS | Status: DC | PRN

## 2024-01-23 MED ORDER — LACTATED RINGERS AMNIOINFUSION: INTRAVENOUS | Status: DC

## 2024-01-23 MED ORDER — BENZOCAINE-MENTHOL 20-0.5 % EX AERO
1.0000 | INHALATION_SPRAY | CUTANEOUS | Status: DC | PRN
  Filled 2024-01-23: qty 56

## 2024-01-23 MED ORDER — FENTANYL-BUPIVACAINE-NACL 0.5-0.125-0.9 MG/250ML-% EP SOLN
12.0000 mL/h | EPIDURAL | Status: DC | PRN
  Administered 2024-01-23: 12 mL/h via EPIDURAL
  Filled 2024-01-23: qty 250

## 2024-01-23 MED ORDER — ZOLPIDEM TARTRATE 5 MG PO TABS: 5.0000 mg | ORAL_TABLET | Freq: Every evening | ORAL | Status: DC | PRN

## 2024-01-23 MED ORDER — LACTATED RINGERS IV SOLN
500.0000 mL | INTRAVENOUS | Status: DC | PRN
  Administered 2024-01-23: 1000 mL via INTRAVENOUS

## 2024-01-23 MED ORDER — DIBUCAINE (PERIANAL) 1 % EX OINT: 1.0000 | TOPICAL_OINTMENT | CUTANEOUS | Status: DC | PRN

## 2024-01-23 MED ORDER — PRENATAL MULTIVITAMIN CH
1.0000 | ORAL_TABLET | Freq: Every day | ORAL | Status: DC
Start: 2024-01-23 — End: 2024-01-25
  Administered 2024-01-23 – 2024-01-25 (×3): 1 via ORAL
  Filled 2024-01-23 (×3): qty 1

## 2024-01-23 MED ORDER — PHENYLEPHRINE 80 MCG/ML (10ML) SYRINGE FOR IV PUSH (FOR BLOOD PRESSURE SUPPORT): 80.0000 ug | PREFILLED_SYRINGE | INTRAVENOUS | Status: DC | PRN

## 2024-01-23 MED ORDER — LIDOCAINE HCL (PF) 1 % IJ SOLN
INTRAMUSCULAR | Status: DC | PRN
  Administered 2024-01-23: 8 mL via EPIDURAL

## 2024-01-23 MED ORDER — SENNOSIDES-DOCUSATE SODIUM 8.6-50 MG PO TABS
2.0000 | ORAL_TABLET | Freq: Every day | ORAL | Status: DC
  Administered 2024-01-24 – 2024-01-25 (×2): 2 via ORAL
  Filled 2024-01-23 (×2): qty 2

## 2024-01-23 MED ORDER — DIPHENHYDRAMINE HCL 25 MG PO CAPS: 25.0000 mg | ORAL_CAPSULE | Freq: Four times a day (QID) | ORAL | Status: DC | PRN

## 2024-01-23 MED ORDER — ACETAMINOPHEN 325 MG PO TABS
650.0000 mg | ORAL_TABLET | ORAL | Status: DC | PRN
  Administered 2024-01-23 – 2024-01-24 (×3): 650 mg via ORAL
  Filled 2024-01-23 (×5): qty 2

## 2024-01-23 MED ORDER — PHENYLEPHRINE 80 MCG/ML (10ML) SYRINGE FOR IV PUSH (FOR BLOOD PRESSURE SUPPORT)
80.0000 ug | PREFILLED_SYRINGE | INTRAVENOUS | Status: DC | PRN
  Filled 2024-01-23: qty 10

## 2024-01-23 MED ORDER — IBUPROFEN 600 MG PO TABS
600.0000 mg | ORAL_TABLET | Freq: Four times a day (QID) | ORAL | Status: DC
  Administered 2024-01-23 – 2024-01-25 (×7): 600 mg via ORAL
  Filled 2024-01-23 (×9): qty 1

## 2024-01-23 MED ORDER — LACTATED RINGERS IV SOLN: 500.0000 mL | Freq: Once | INTRAVENOUS | Status: DC

## 2024-01-23 MED ORDER — ONDANSETRON HCL 4 MG/2ML IJ SOLN: 4.0000 mg | INTRAMUSCULAR | Status: DC | PRN

## 2024-01-23 MED ORDER — OXYCODONE HCL 5 MG PO TABS
5.0000 mg | ORAL_TABLET | ORAL | Status: DC | PRN
  Administered 2024-01-23 – 2024-01-25 (×6): 5 mg via ORAL
  Filled 2024-01-23 (×6): qty 1

## 2024-01-23 MED ORDER — PENICILLIN G POT IN DEXTROSE 60000 UNIT/ML IV SOLN
3.0000 10*6.[IU] | INTRAVENOUS | Status: DC
  Administered 2024-01-23: 3 10*6.[IU] via INTRAVENOUS
  Filled 2024-01-23: qty 50

## 2024-01-23 MED ORDER — TETANUS-DIPHTH-ACELL PERTUSSIS 5-2-15.5 LF-MCG/0.5 IM SUSP
0.5000 mL | Freq: Once | INTRAMUSCULAR | Status: DC
Start: 2024-01-24 — End: 2024-01-25

## 2024-01-23 MED ORDER — SIMETHICONE 80 MG PO CHEW: 80.0000 mg | CHEWABLE_TABLET | ORAL | Status: DC | PRN

## 2024-01-23 MED ORDER — SODIUM CHLORIDE 0.9 % IV SOLN
5.0000 10*6.[IU] | Freq: Once | INTRAVENOUS | Status: AC
  Administered 2024-01-23: 5 10*6.[IU] via INTRAVENOUS
  Filled 2024-01-23: qty 5

## 2024-01-23 MED ORDER — ONDANSETRON HCL 4 MG PO TABS: 4.0000 mg | ORAL_TABLET | ORAL | Status: DC | PRN

## 2024-01-23 MED ORDER — METRONIDAZOLE 500 MG PO TABS
500.0000 mg | ORAL_TABLET | Freq: Two times a day (BID) | ORAL | Status: DC
  Filled 2024-01-23: qty 1

## 2024-01-23 MED ORDER — DIPHENHYDRAMINE HCL 50 MG/ML IJ SOLN: 12.5000 mg | INTRAMUSCULAR | Status: DC | PRN

## 2024-01-23 NOTE — Anesthesia Postprocedure Evaluation (Signed)
 Anesthesia Post Note  Patient: Angela Sexton  Procedure(s) Performed: AN AD HOC LABOR EPIDURAL     Patient location during evaluation: Mother Baby Anesthesia Type: Epidural Level of consciousness: awake and alert Pain management: pain level controlled Vital Signs Assessment: post-procedure vital signs reviewed and stable Respiratory status: spontaneous breathing, nonlabored ventilation and respiratory function stable Cardiovascular status: stable Postop Assessment: no headache, no backache and epidural receding Anesthetic complications: no   No notable events documented.  Last Vitals:  Vitals:   01/23/24 0945 01/23/24 1000  BP: 130/75 133/87  Pulse: 90 100  Resp:    Temp:    SpO2:      Last Pain:  Vitals:   01/23/24 1000  TempSrc:   PainSc: 0-No pain   Pain Goal:                Epidural/Spinal Function Cutaneous sensation: Tingles (01/23/24 1000), Patient able to flex knees: Yes (01/23/24 1000), Patient able to lift hips off bed: Yes (01/23/24 1000), Back pain beyond tenderness at insertion site: No (01/23/24 1000), Progressively worsening motor and/or sensory loss: No (01/23/24 1000), Bowel and/or bladder incontinence post epidural: No (01/23/24 1000)  [name]

## 2024-01-23 NOTE — Anesthesia Procedure Notes (Signed)
 Epidural Patient location during procedure: OB Start time: 01/23/2024 5:30 AM End time: 01/23/2024 5:40 AM  Staffing Anesthesiologist: Merla Almarie HERO, DO Performed: anesthesiologist   Preanesthetic Checklist Completed: patient identified, IV checked, risks and benefits discussed, monitors and equipment checked, pre-op evaluation and timeout performed  Epidural Patient position: sitting Prep: DuraPrep and site prepped and draped Patient monitoring: continuous pulse ox, blood pressure, heart rate and cardiac monitor Approach: midline Location: L3-L4 Injection technique: LOR air  Needle:  Needle insertion depth: 6.5 cm Needle type: Tuohy  Needle gauge: 17 G Needle length: 9 cm Needle insertion depth: 6.5 cm Catheter type: closed end flexible Catheter size: 19 Gauge Catheter at skin depth: 12 cm Test dose: negative  Assessment Sensory level: T8 Events: blood not aspirated, no cerebrospinal fluid, injection not painful, no injection resistance, no paresthesia and negative IV test  Additional Notes Patient identified. Risks/Benefits/Options discussed with patient including but not limited to bleeding, infection, nerve damage, paralysis, failed block, incomplete pain control, headache, blood pressure changes, nausea, vomiting, reactions to medication both or allergic, itching and postpartum back pain. Confirmed with bedside nurse the patient's most recent platelet count. Confirmed with patient that they are not currently taking any anticoagulation, have any bleeding history or any family history of bleeding disorders. Patient expressed understanding and wished to proceed. All questions were answered. Sterile technique was used throughout the entire procedure. Please see nursing notes for vital signs. Test dose was given through epidural catheter and negative prior to continuing to dose epidural or start infusion. Warning signs of high block given to the patient including shortness  of breath, tingling/numbness in hands, complete motor block, or any concerning symptoms with instructions to call for help. Patient was given instructions on fall risk and not to get out of bed. All questions and concerns addressed with instructions to call with any issues or inadequate analgesia.  Reason for block:procedure for pain

## 2024-01-23 NOTE — Lactation Note (Signed)
 This note was copied from a baby's chart. Lactation Consultation Note  Patient Name: Angela Sexton Unijb'd Date: 01/23/2024 Age:31 hours Reason for consult: Initial assessment;Early term 37-38.6wks.  P3, ETI infant less than 6 lbs. Per MOB, infant is breastfeeding well she has no concerns for LC at this time. MOB is really tired, infant breastfeed for 10 minutes prior to [facility] entering the room. LC observed infant sucking vigorously on pacifier in basinet. Per MOB, she does not want re-latch infant or hand express to give infant colostrum. MOB would like to rest, she is experienced with breastfeeding see maternal data below. LC suggested due to infant being SGA to pump and give infant back extra volume of colostrum. MOB would like to pump but later. RN will set MOB up with DEBP later so that MOB can rest for now. MOB understands to pump every 3 hours for 15 minutes on initial setting and handout given on Breast Milk storage and collection. LC encouraged MOB to wait until infant 3 weeks to offer pacifier. MOB does not have DEBP at home  she will receive manual pump in the DEBP kit. Type of insurance is she is ineligible for the [employer] DEBP.   Maternal Data Has patient been taught Hand Expression?: Yes Does the patient have breastfeeding experience prior to this delivery?: Yes How long did the patient breastfeed?: Per MOB, she breastfeeding, her 1st child for 6 months and 2nd child for 18 months.  Feeding Mother's Current Feeding Choice: Breast Milk  LATCH Score  LC did not observe latch, infant finished breastfeeding prior to [facility] entering the room.                   Lactation Tools Discussed/Used    Interventions Interventions: Breast feeding basics reviewed;Assisted with latch;Skin to skin;Guidelines for Milk Supply and Pumping Schedule Handout;LC Services brochure;CDC milk storage guidelines;CDC Guidelines for Breast Pump Cleaning  Discharge    Consult Status Consult Status:  Follow-up Date: 01/24/24 Follow-up type: In-patient    Grayce LULLA Batter 01/23/2024, 2:05 PM

## 2024-01-23 NOTE — Progress Notes (Addendum)
 Labor Progress Note Angela Sexton is a 33 y.o. H5E7987 at 108w1d presented for IOL due to cholestasis.  S: Doing well. Breathing well through contractions. Considering all pain management options.   O:  BP 112/75   Pulse 69   Temp 97.7 F (36.5 C) (Axillary)   Resp 16   LMP 05/08/2023 (Exact Date)   SpO2 99%  EFM: 135/Min/Mod/Max: Moderate Variability/Accelerations (+),Decelerations (-)  CVE: Dilation: 5 Effacement (%): 50 Cervical Position: Posterior Station: -3 Presentation: Vertex Exam by:: Magali Bishop, MD   A&P: 33 y.o. H5E7987 [redacted]w[redacted]d  #Labor: Progressing well.  #Pain: Per patient request, considering epidural #FWB: Category I #GBS positive -> PCN  Melburn Treiber L Garv Kuechle, MD 1:13 AM

## 2024-01-23 NOTE — Anesthesia Preprocedure Evaluation (Signed)
 Anesthesia Evaluation  Patient identified by MRN, date of birth, ID band Patient awake    Reviewed: Allergy & Precautions, Patient's Chart, lab work & pertinent test results  Airway Mallampati: II  TM Distance: >3 FB Neck ROM: Full    Dental no notable dental hx.    Pulmonary neg pulmonary ROS   Pulmonary exam normal breath sounds clear to auscultation       Cardiovascular negative cardio ROS Normal cardiovascular exam Rhythm:Regular Rate:Normal     Neuro/Psych  PSYCHIATRIC DISORDERS Anxiety Depression Bipolar Disorder   negative neurological ROS     GI/Hepatic negative GI ROS, Neg liver ROS,,,  Endo/Other  negative endocrine ROS  Bmi 31  Renal/GU negative Renal ROS  negative genitourinary   Musculoskeletal negative musculoskeletal ROS (+)    Abdominal   Peds negative pediatric ROS (+)  Hematology  (+) Blood dyscrasia, anemia Hb 11.2, plt 232   Anesthesia Other Findings   Reproductive/Obstetrics (+) Pregnancy                              Anesthesia Physical Anesthesia Plan  ASA: 2  Anesthesia Plan: Epidural   Post-op Pain Management:    Induction:   PONV Risk Score and Plan: 2  Airway Management Planned: Natural Airway  Additional Equipment: None  Intra-op Plan:   Post-operative Plan:   Informed Consent: I have reviewed the patients History and Physical, chart, labs and discussed the procedure including the risks, benefits and alternatives for the proposed anesthesia with the patient or authorized representative who has indicated his/her understanding and acceptance.       Plan Discussed with:   Anesthesia Plan Comments:         Anesthesia Quick Evaluation

## 2024-01-23 NOTE — Discharge Summary (Signed)
 Postpartum Discharge Summary  Date of Service updated***     Patient Name: Angela Sexton DOB: May 10, 1990 MRN: [id number]  Date of admission: 01/22/2024 Delivery date:01/23/2024 Delivering provider: JOMARIE CAMPI A Date of discharge: 01/23/2024  Admitting diagnosis: Indication for care in labor or delivery [O75.9] Intrauterine pregnancy: [redacted]w[redacted]d     Secondary diagnosis:  Active Problems:   Cholestasis (HCC)   Indication for care in labor or delivery  Additional problems: ***    Discharge diagnosis: Term Pregnancy Delivered                                              Post partum procedures:none Augmentation: AROM, Pitocin , and Cytotec  Complications: None  Hospital course: Induction of Labor With Vaginal Delivery   33 y.o. yo [phone] at [redacted]w[redacted]d was admitted to the hospital 01/22/2024 for induction of labor.  Indication for induction: Cholestasis of pregnancy.  Patient had an labor course complicated by recurrent variable decelerations. She quickly progressed to fully dilated and delivered. Membrane Rupture Time/Date: 8:33 PM,01/22/2024  Delivery Method:Vaginal, Spontaneous Operative Delivery:N/A Episiotomy: None Lacerations:  None Details of delivery can be found in separate delivery note.  Patient had a postpartum course complicated by***. Patient is discharged home 01/23/24.  Newborn Data: Birth date:01/23/2024 Birth time:9:11 AM Gender:Female Living status:Living Apgars:8 ,9  Weight:2570 g  Magnesium  Sulfate received: No BMZ received: No Rhophylac:N/A MMR:{MMR:30440033} T-DaP:{Tdap:23962} Flu: {Qol:76036} RSV Vaccine received: {RSV:31013} Transfusion:{Transfusion received:30440034}  Immunizations received: Immunization History  Administered Date(s) Administered   DTaP 01/16/1993, 05/01/1993, 07/10/1993, 05/01/1994, 05/14/1994, 01/17/1995, 11/22/1996   HIB (PRP-T) 01/16/1993, 05/01/1993, 07/15/1993, 05/14/1994   HIB, Unspecified 01/16/1993, 05/01/1993,  07/15/1993, 01/16/1994, 05/14/1994   Hepatitis A, Ped/Adol-2 Dose 08/08/2010   Hepatitis B, PED/ADOLESCENT 11/27/1992, 01/16/1993, 11/20/1993   IPV 01/16/1993, 05/01/1993, 07/10/1993, 05/14/1994   MMR 11/20/1993, 11/22/1996   Meningococcal Acwy, Unspecified 05/19/2007   Meningococcal Conjugate 05/19/2007   PFIZER Comirnaty(Gray Top)Covid-19 Tri-Sucrose Vaccine 06/28/2019, 07/29/2019   PFIZER(Purple Top)SARS-COV-2 Vaccination 06/28/2019, 07/29/2019   Tdap 01/06/2005, 08/25/2021    Physical exam  Vitals:   01/23/24 0850 01/23/24 0930 01/23/24 0945 01/23/24 1000  BP: 124/80 (!) 117/56 130/75 133/87  Pulse: 63 (!) 104 90 100  Resp:  18    Temp:  98 F (36.7 C)    TempSrc:  Oral    SpO2:       General: {Exam; general:21111117} Lochia: {Desc; appropriate/inappropriate:30686::appropriate} Uterine Fundus: {Desc; firm/soft:30687} Incision: {Exam; incision:21111123} DVT Evaluation: {Exam; dvt:2111122} Labs: Lab Results  Component Value Date   WBC 8.0 01/22/2024   HGB 11.2 (L) 01/22/2024   HCT 34.5 (L) 01/22/2024   MCV 88.5 01/22/2024   PLT 232 01/22/2024      Latest Ref Rng & Units 01/22/2024    7:36 AM  CMP  Glucose 70 - 99 mg/dL 77   BUN 6 - 20 mg/dL 5   Creatinine 9.55 - 8.99 mg/dL 9.55   Sodium 864 - 854 mmol/L 136   Potassium 3.5 - 5.1 mmol/L 3.5   Chloride 98 - 111 mmol/L 106   CO2 22 - 32 mmol/L 22   Calcium 8.9 - 10.3 mg/dL 8.5   Total Protein 6.5 - 8.1 g/dL 6.9   Total Bilirubin 0.0 - 1.2 mg/dL 1.1   Alkaline Phos 38 - 126 U/L 111   AST 15 - 41 U/L 21   ALT 0 - 44 U/L 17  Edinburgh Score:    10/08/2021    9:08 AM  Edinburgh Postnatal Depression Scale Screening Tool  I have been able to laugh and see the funny side of things. 0  I have looked forward with enjoyment to things. 1  I have blamed myself unnecessarily when things went wrong. 3  I have been anxious or worried for no good reason. 3  I have felt scared or panicky for no good reason. 3  Things  have been getting on top of me. 1  I have been so unhappy that I have had difficulty sleeping. 0  I have felt sad or miserable. 2  I have been so unhappy that I have been crying. 2  The thought of harming myself has occurred to me. 0  Edinburgh Postnatal Depression Scale Total 15      Data saved with a previous flowsheet row definition   No data recorded  After visit meds:  Allergies as of 01/23/2024       Reactions   Sulfa Antibiotics    Nausea/vomiting, weakness     Med Rec must be completed prior to using this [facility]***        Discharge home in stable condition Infant Feeding: {Baby feeding:23562} Infant Disposition:{CHL IP OB HOME WITH FNUYZM:76418} Discharge instruction: per After Visit Summary and Postpartum booklet. Activity: Advance as tolerated. Pelvic rest for 6 weeks.  Diet: {OB ipzu:78888878} Future Appointments: Future Appointments  Date Time Provider Department Center  01/27/2024  2:15 PM Vannie Cornell JONELLE EDDY [facility]  02/10/2024  3:15 PM Vannie Cornell JONELLE EDDY [facility]  02/17/2024  2:35 PM Vannie Cornell JONELLE EDDY [facility]  03/02/2024 10:15 AM Vannie Cornell JONELLE, CNM [facility]  03/04/2024  9:00 AM Ty Bernice RAMAN, [facility] None  03/16/2024 11:00 AM Ty Bernice RAMAN, [facility] None   Follow up Visit:   Please schedule this patient for a In person postpartum visit in 6 weeks with the following provider: Any provider. Additional Postpartum F/U:None  Low risk pregnancy complicated by: none Delivery mode:  Vaginal, Spontaneous Anticipated Birth Control:  POPs  Message sent to [facility] 10/4   01/23/2024 Charlie DELENA Courts, MD

## 2024-01-24 NOTE — Lactation Note (Signed)
 This note was copied from a baby's chart. Lactation Consultation Note  Patient Name: Angela Sexton Unijb'd Date: 01/24/2024 Age:31 hours Reason for consult: Follow-up assessment;Early term 37-38.6wks, infant with weight loss -2.53%, less than 6 lbs at birth.  Per MOB, infant recently breastfeed for 10 minutes at 1330 pm. MOB is using the DEBP and expressing 15-20 mls when she pumps and gives that back to infant. MOB will continue to breastfeed infant by cues, on demand, 8+ times within 24 hours, skin to skin. MOB does not have any questions or concerns for LC at this time. Infant asleep in basinet and MOB getting ready to eat her lunch. MOB feels breastfeeding is going well. MOB is experienced with breastfeeding, see infant flow sheet with feedings.   Maternal Data    Feeding Mother's Current Feeding Choice: Breast Milk  LATCH Score  LC did not observe infant's latch.                   Lactation Tools Discussed/Used    Interventions Interventions: Education;DEBP  Discharge    Consult Status Consult Status: Follow-up Date: 01/25/24 Follow-up type: In-patient    Angela Sexton 01/24/2024, 1:37 PM

## 2024-01-24 NOTE — Progress Notes (Signed)
 POSTPARTUM PROGRESS NOTE  Subjective: Angela Sexton is a 33 y.o. H5E6986 s/p SVB at [redacted]w[redacted]d.  She reports she doing well. No acute events overnight. She denies any problems with ambulating, voiding or po intake. Denies nausea or vomiting. She has  passed flatus. Pain is well controlled.  Lochia is small.  Objective: Blood pressure (!) 91/58, pulse 72, temperature 98.9 F (37.2 C), resp. rate 18, last menstrual period 05/08/2023, SpO2 99%, unknown if currently breastfeeding.  Physical Exam:  General: alert, cooperative and no distress Chest: no respiratory distress Abdomen: soft, non-tender  Uterine Fundus: firm, appropriately tender Extremities: No calf swelling or tenderness  trace edema  Recent Labs    01/22/24 0736  HGB 11.2*  HCT 34.5*    Assessment/Plan: Angela Sexton is a 33 y.o. H5E6986 s/p SVB at [redacted]w[redacted]d  Routine Postpartum Care: Doing well, pain well-controlled.  -- Continue routine care, lactation support  -- Contraception: POPs -- Feeding: breast  Dispo: Plan for discharge today versus 01/25/2024.  TSB pending.   Camie Rote, MSN, CNM, RNC-OB Certified Nurse Midwife, [facility] 01/24/2024 6:26 AM\

## 2024-01-24 NOTE — Clinical Social Work Maternal (Addendum)
 CLINICAL SOCIAL WORK MATERNAL/CHILD NOTE   Patient Details  Name: Angela Sexton MRN: [id number] Date of Birth: [birth year]   Date:  01/24/2024   Clinical Social Worker Initiating Note:  Sharyne Roulette, LCSWA            Date/Time: Initiated:  01/24/24/1443          Child's Name:  Von Ned    Biological Parents:  Mother, Father    Need for Interpreter:  None    Reason for Referral:  Behavioral Health Concerns , Edinburgh score of 11   Address:  [address] [place] [address]    Phone number:  [phone] (home)      Additional phone number:    Household Members/Support Persons (HM/SP):   Household Member/Support Person 1, Household Member/Support Person 2, Household Member/Support Person 3     HM/SP Name Relationship DOB or Age  HM/SP -1 Cordella Ned FOB 08/29/1988  HM/SP -2 Milan Delores Daughter 12/20/2012  HM/SP -3 Noelani-Kamyla Ned Daughter 08/23/2021  HM/SP -4        HM/SP -5        HM/SP -6        HM/SP -7        HM/SP -8            Natural Supports (not living in the home):  Immediate Family    Professional Supports: Therapist Jannett Rao [facility])    Employment: Full-time    Type of Work: Runner, broadcasting/film/video    Education:  Engineer, maintenance (IT)    Homebound arranged:     Surveyor, quantity Resources:  Medicaid    Other Resources:  [facility]    Cultural/Religious Considerations Which May Impact Care:     Strengths:  Ability to meet basic needs  , Home prepared for child  , Pediatrician chosen    Psychotropic Medications:          Pediatrician:    [facility] (including [place])   Pediatrician List:    [facility]    [place] Other [facility])      Pediatrician Fax Number:     Risk Factors/Current Problems:  Mental Health Concerns      Cognitive State:  Insightful  , Goal Oriented  , Linear  Thinking  , Able to Concentrate      Mood/Affect:  Comfortable  , Interested  , Relaxed  , Calm      CSW Assessment: CSW was consulted due to [place] score of 11, history of Bipolar Disorder, PTSD, anxiety, and depression. CSW met with MOB at bedside to complete assessment. When CSW entered room, MOB was observed sitting in hospital bed. Infant was asleep on her back in bassinet. FOB was present sitting nearby. CSW introduced self and requested to speak with MOB alone. FOB left room. CSW explained reason for consult. MOB presented as calm, was agreeable to consult and remained engaged throughout encounter.    MOB confirmed demographic information listed in chart. MOB reports she lives with FOB and her 2 other children. CSW inquired how MOB is feeling emotionally since infant's arrival and discussed MOB's Edinburgh score of 11. MOB shared how she was feeling really emotional at first and now is really exhausted. CSW inquired about MOB's mental health history. MOB acknowledged diagnoses of Bipolar Disorder type II, PTSD, anxiety, and depression, which  she reports being diagnosed with all during college. CSW inquired about MOB's mental health during pregnancy. MOB recalls feeling up and down, marked by symptoms of irritability, sadness, and crying often. MOB states that she is not currently taking mental health medication but shared how she was prescribed Zoloft  during pregnancy through her OBGYN; however, she discontinued the medication after it causing an increase in irritability. MOB reports she has been prescribed Vraylar in the past which has worked well for her and she plans to establish care with a psychiatrist postpartum with the intention of restarting Vraylar now that she has delivered. MOB reports she met with integrated behavioral health therapists through [facility] during her pregnancy and was referred to [facility]. MOB states she recently  established care with therapist, Bernice Rao, [place] and last met with her this past Thursday. MOB states she has a follow up appointment next month. CSW inquired if MOB experienced symptoms of postpartum depression (ppd) following prior deliveries. MOB reports she experienced ppd symptoms after the birth of her first child, which she recalled lasted beyond 1 year postpartum. MOB shared how she did not realize she was experiencing ppd until she eventually started seeing a therapist and her symptoms eventually resolved. MOB recalled experiencing symptoms of ppd/a after the birth of her second child, marked by panic attacks. MOB recalled after the birth of her second child, she had more family support and was able to restart therapy quicker. MOB reports her ppd/a symptoms improved after about 2 months postpartum after her second child's birth. CSW assessed for safety. MOB denied current SI/HI/DV.   CSW provided education regarding the baby blues period vs. perinatal mood disorders, discussed treatment and gave resources for mental health follow up if concerns arise.  CSW recommends self-evaluation during the postpartum time period using the New Mom Checklist from Postpartum Progress and encouraged MOB to contact a medical professional if symptoms are noted at any time.     CSW inquired about needed items for infant. MOB reports she has a pack n play and car seat for infant but did not anticipate infant being so small and does not have any preemie diapers or clothing. MOB reports financial strain and shared that she did not have a baby shower for infant. MOB states she utilizes [employer] but the market did not have a lot of supplies the last time she had an appointment. CSW attempted to schedule MOB a follow up appointment; however, MOB is not eligible to schedule a return appointment to [facility] for approximately 3 weeks. CSW provided MOB with Baby Basics bags from [employer] with  permission from [employer], containing newborn diapers and newborn onesies for infant. MOB states she plans to apply for food stamps after being previously denied. CSW provided MOB with utility, rental, and food assistance resources. MOB has chosen [facility] for infant's follow up care but is considering switching to a clinic closer to her home. CSW provided MOB with a pediatrician list.   CSW provided review of Sudden Infant Death Syndrome (SIDS) precautions.     CSW identifies no further need for intervention and no barriers to discharge at this time.   CSW Plan/Description:  No Further Intervention Required/No Barriers to Discharge, Sudden Infant Death Syndrome (SIDS) Education, Other Information/Referral to [employer], Perinatal Mood and Anxiety Disorder (PMADs) Education      Sharyne MARLA Roulette, LCSWA 01/24/2024, 2:48 PM

## 2024-01-25 ENCOUNTER — Other Ambulatory Visit (HOSPITAL_COMMUNITY): Payer: Self-pay

## 2024-01-25 LAB — T.PALLIDUM AB, TOTAL
T Pallidum Abs: REACTIVE — AB
T Pallidum Abs: REACTIVE — AB

## 2024-01-25 MED ORDER — OXYCODONE HCL 5 MG PO TABS
5.0000 mg | ORAL_TABLET | ORAL | 0 refills | Status: DC | PRN
  Filled 2024-01-25: qty 10, 2d supply, fill #0

## 2024-01-25 MED ORDER — IBUPROFEN 600 MG PO TABS
600.0000 mg | ORAL_TABLET | Freq: Four times a day (QID) | ORAL | 0 refills | Status: DC
  Filled 2024-01-25: qty 30, 8d supply, fill #0

## 2024-01-25 NOTE — Patient Instructions (Signed)
 If interested in an outpatient lactation consult in office or virtually please reach out to us  at [facility] (First Floor) [address] [place]  Please feel free to out with any lactation related questions or concerns [phone]  to leave a message for our lactation voicemail box.  Lactation support groups:  [facility], Tuesdays 10:00 am -12:00 pm at [address] on the second floor in the conference room, lactating parents and lap babies welcome.  [email or web address]  [email or web address]  Si est interesado en una consulta ambulatoria sobre lactancia en el consultorio o virtualmente, comunquese con nosotros al [facility] quality scientist) [address] [place], [place] No dude en comunicarse con cualquier pregunta o inquietud relacionada con la lactancia al [phone] para dejar un mensaje en nuestro buzn de voz de lactancia  Grupos de apoyo para la lactancia:  [facility], martes de 10:00 a. m. a 12:00 p. m., en [address], segundo piso, sala de conferencias. Se admiten madres lactantes y bebs en regazo.      Delaney Mandril, [facility]

## 2024-01-26 ENCOUNTER — Ambulatory Visit: Payer: Self-pay | Admitting: Obstetrics and Gynecology

## 2024-01-27 ENCOUNTER — Encounter: Admitting: Certified Nurse Midwife

## 2024-02-01 ENCOUNTER — Encounter: Payer: Self-pay | Admitting: Certified Nurse Midwife

## 2024-02-02 ENCOUNTER — Ambulatory Visit

## 2024-02-02 NOTE — BH Specialist Note (Unsigned)
 Integrated Behavioral Health via Telemedicine Visit  02/02/2024 Angela Sexton [id number]  Number of Integrated Behavioral Health Clinician visits: 1- Initial Visit  Session Start time: 1546   Session End time: 1623  Total time in minutes: 37  Referring Provider: *** Patient/Family location: [facility] Provider location: *** All persons participating in visit: *** Types of Service: {CHL AMB TYPE OF SERVICE:[phone]}  I connected with Angela Sexton and/or Angela Sexton's {family members:20773} via  Telephone or Video Enabled Telemedicine Application  (Video is Caregility application) and verified that I am speaking with the correct person using two identifiers. Discussed confidentiality: {YES/NO:21197}  I discussed the limitations of telemedicine and the availability of in person appointments.  Discussed there is a possibility of technology failure and discussed alternative modes of communication if that failure occurs.  I discussed that engaging in this telemedicine visit, they consent to the provision of behavioral healthcare and the services will be billed under their insurance.  Patient and/or legal guardian expressed understanding and consented to Telemedicine visit: {YES/NO:21197}  Presenting Concerns: Patient and/or family reports the following symptoms/concerns: *** Duration of problem: ***; Severity of problem: {Mild/Moderate/Severe:20260}  Patient and/or Family's Strengths/Protective Factors: {CHL AMB BH PROTECTIVE FACTORS:[phone]}  Goals Addressed: Patient will:  Reduce symptoms of: {IBH Symptoms:21014056}   Increase knowledge and/or ability of: {IBH Patient Tools:21014057}   Demonstrate ability to: {IBH Goals:21014053}  Progress towards Goals: {CHL AMB BH PROGRESS TOWARDS GOALS:[phone]}    Interventions: Interventions utilized:  {IBH Interventions:21014054} Standardized Assessments completed: {IBH Screening Tools:21014051}    Patient and/or Family  Response: ***  Clinical Assessment/Diagnosis  No diagnosis found.    Assessment: Patient currently experiencing ***.   Patient may benefit from ***.  Plan: Follow up with behavioral health clinician on : *** Behavioral recommendations: *** Referral(s): {IBH Referrals:21014055}  I discussed the assessment and treatment plan with the patient and/or parent/guardian. They were provided an opportunity to ask questions and all were answered. They agreed with the plan and demonstrated an understanding of the instructions.   They were advised to call back or seek an in-person evaluation if the symptoms worsen or if the condition fails to improve as anticipated.  Warren BROCKS Nazariah Cadet, LCSW     01/20/2024    5:14 PM 12/23/2023    4:30 PM 11/20/2023   10:11 AM 09/21/2023    4:05 PM 07/22/2023    5:00 PM  Depression screen PHQ 2/9  Decreased Interest 1 0 2 1 1   Down, Depressed, Hopeless 0 1 3 2 2   PHQ - 2 Score 1 1 5 3 3   Altered sleeping 3 1 3 3 2   Tired, decreased energy 1 3 3 3 3   Change in appetite 0 1 3 0 1  Feeling bad or failure about yourself  0 1 3 1 3   Trouble concentrating 1 2 3 1 2   Moving slowly or fidgety/restless 0 0 3 3 1   Suicidal thoughts 0 0 2 0 1  PHQ-9 Score 6 9 25 14 16   Difficult doing work/chores   Very difficult        01/20/2024    5:14 PM 12/23/2023    4:30 PM 11/20/2023   10:10 AM 09/21/2023    4:08 PM  GAD 7 : Generalized Anxiety Score  Nervous, Anxious, on Edge 1 2 3 3   Control/stop worrying 1 2 3 3   Worry too much - different things 1 2 3 3   Trouble relaxing 0 2 3 3   Restless 1 1 3 1   Easily  annoyed or irritable 2 3 3 3   Afraid - awful might happen 0 1 3 1   Total GAD 7 Score 6 13 21 17   Anxiety Difficulty   Extremely difficult       01/24/2024    4:32 AM 10/08/2021    9:08 AM 09/12/2021    2:44 PM 08/31/2021    2:55 PM 08/24/2021    5:57 PM  Edinburgh Postnatal Depression Scale Screening Tool  I have been able to laugh and see the funny side of things. 0  0 0 1 1  I have looked forward with enjoyment to things. 1 1 1 1  0  I have blamed myself unnecessarily when things went wrong. 2 3 3 3 3   I have been anxious or worried for no good reason. 2 3 3 2 3   I have felt scared or panicky for no good reason. 0 3 0 0 1  Things have been getting on top of me. 2 1 3 2 2   I have been so unhappy that I have had difficulty sleeping. 0 0 0 0 0  I have felt sad or miserable. 2 2 1 2 2   I have been so unhappy that I have been crying. 2 2 1 3 1   The thought of harming myself has occurred to me. 0 0 0 0 0  Edinburgh Postnatal Depression Scale Total 11 15  12  14  13       Data saved with a previous flowsheet row definition

## 2024-02-03 ENCOUNTER — Encounter: Admitting: Certified Nurse Midwife

## 2024-02-03 ENCOUNTER — Telehealth (HOSPITAL_COMMUNITY): Payer: Self-pay | Admitting: *Deleted

## 2024-02-03 ENCOUNTER — Ambulatory Visit (INDEPENDENT_AMBULATORY_CARE_PROVIDER_SITE_OTHER): Admitting: Clinical

## 2024-02-03 DIAGNOSIS — F3181 Bipolar II disorder: Secondary | ICD-10-CM | POA: Diagnosis not present

## 2024-02-03 DIAGNOSIS — F411 Generalized anxiety disorder: Secondary | ICD-10-CM

## 2024-02-03 NOTE — BH Specialist Note (Unsigned)
 Integrated Behavioral Health via Telemedicine Visit  02/04/2024 Angela Sexton [id number]  Number of Integrated Behavioral Health Clinician visits: 1- Initial Visit  Session Start time: 1546   Session End time: 1623  Total time in minutes: 37  Referring Provider: Cornell Finder, CNM Patient/Family location: Home [facility] Provider location: [facility]  All persons participating in visit: Patient Angela Sexton and Angela Sexton   Types of Service: Individual psychotherapy and Video visit  I connected with Angela Sexton and/or Angela Sexton's Sexton via  Telephone or Video Enabled Telemedicine Application  (Video is Caregility application) and verified that I am speaking with the correct person using two identifiers. Discussed confidentiality: Yes   I discussed the limitations of telemedicine and the availability of in person appointments.  Discussed there is a possibility of technology failure and discussed alternative modes of communication if that failure occurs.  I discussed that engaging in this telemedicine visit, they consent to the provision of behavioral healthcare and the services will be billed under their insurance.  Patient and/or legal guardian expressed understanding and consented to Telemedicine visit: Yes   Presenting Concerns: Patient and/or family reports the following symptoms/concerns: Adjusting to new motherhood again; lack of quality sleep, intrusive thoughts (no SI; no HI); staying well-hydrated, but noticing mild headache and white flashes in her right eye; difficulty taking Strattera  consistently, does better with visual, rather than auditory, reminders. Pt states she uses self-talk to manage intrusive thoughts; will attend upcoming ongoing therapy appointment on 03/04/24.  Duration of problem: Ongoing; Severity of problem: moderate  Patient and/or Family's Strengths/Protective Factors: Social connections,  Concrete supports in place (healthy food, safe environments, etc.), Sense of purpose, and Physical Health (exercise, healthy diet, medication compliance, etc.)  Goals Addressed: Patient will:  Reduce symptoms of: anxiety, depression, insomnia, mood instability, and stress   Increase knowledge and/or ability of: healthy habits and self-management skills   Demonstrate ability to: Increase healthy adjustment to current life circumstances, Increase adequate support systems for patient/family, and Increase motivation to adhere to plan of care  Progress towards Goals: Ongoing  Interventions: Interventions utilized:  Psychoeducation and/or Health Education, Link to [employer], and Supportive Reflection Standardized Assessments completed: Not Needed  Patient and/or Family Response: Patient agrees with treatment plan.  Clinical Assessment/Diagnosis  Bipolar II disorder (HCC)  Generalized anxiety disorder   Patient may benefit from psychoeducation and brief therapeutic interventions regarding coping with symptoms of depression, anxiety, insomnia, life stress   Plan: Follow up with behavioral health clinician on : Call Angela Sexton at [phone], as needed. Behavioral recommendations:  -Continue taking Strattera  as prescribed  (Begin using visual reminder, as discussed) -Continue prioritizing healthy self-care (regular meals, adequate rest; allowing practical help from supportive friends and family, as much as able) until at least postpartum medical appointment -Continue in mom group; additional new mom support group as needed at either [email or web address] or [email or web address]  -Continue plan to attend upcoming next therapy session on 03/04/24 -Discuss any medical concerns that come up with medical provider on 03/02/24 (provider has been notified on your concern)  Referral(s): Integrated Art gallery manager (In Clinic) and [employer]:  new mom support  I discussed  the assessment and treatment plan with the patient and/or parent/guardian. They were provided an opportunity to ask questions and all were answered. They agreed with the plan and demonstrated an understanding of the instructions.   They were advised to call back or seek an in-person evaluation if the symptoms  worsen or if the condition fails to improve as anticipated.  Warren BROCKS Yostin Malacara, LCSW     01/20/2024    5:14 PM 12/23/2023    4:30 PM 11/20/2023   10:11 AM 09/21/2023    4:05 PM 07/22/2023    5:00 PM  Depression screen PHQ 2/9  Decreased Interest 1 0  1 1  Down, Depressed, Hopeless 0 1  2 2   PHQ - 2 Score 1 1  3 3   Altered sleeping 3 1  3 2   Tired, decreased energy 1 3  3 3   Change in appetite 0 1  0 1  Feeling bad or failure about yourself  0 1  1 3   Trouble concentrating 1 2  1 2   Moving slowly or fidgety/restless 0 0  3 1  Suicidal thoughts 0 0  0 1  PHQ-9 Score 6 9  14 16   Difficult doing work/chores          Information is confidential and restricted. Go to Review Flowsheets to unlock data.      01/20/2024    5:14 PM 12/23/2023    4:30 PM 11/20/2023   10:10 AM 09/21/2023    4:08 PM  GAD 7 : Generalized Anxiety Score  Nervous, Anxious, on Edge 1 2  3   Control/stop worrying 1 2  3   Worry too much - different things 1 2  3   Trouble relaxing 0 2  3  Restless 1 1  1   Easily annoyed or irritable 2 3  3   Afraid - awful might happen 0 1  1  Total GAD 7 Score 6 13  17   Anxiety Difficulty         Information is confidential and restricted. Go to Review Flowsheets to unlock data.      01/24/2024    4:32 AM 10/08/2021    9:08 AM 09/12/2021    2:44 PM 08/31/2021    2:55 PM 08/24/2021    5:57 PM  Edinburgh Postnatal Depression Scale Screening Tool  I have been able to laugh and see the funny side of things. 0 0 0 1 1  I have looked forward with enjoyment to things. 1 1 1 1  0  I have blamed myself unnecessarily when things went wrong. 2 3 3 3 3   I have been anxious or worried for no  good reason. 2 3 3 2 3   I have felt scared or panicky for no good reason. 0 3 0 0 1  Things have been getting on top of me. 2 1 3 2 2   I have been so unhappy that I have had difficulty sleeping. 0 0 0 0 0  I have felt sad or miserable. 2 2 1 2 2   I have been so unhappy that I have been crying. 2 2 1 3 1   The thought of harming myself has occurred to me. 0 0 0 0 0  Edinburgh Postnatal Depression Scale Total 11 15  12  14  13       Data saved with a previous flowsheet row definition

## 2024-02-03 NOTE — Telephone Encounter (Signed)
 02/03/2024  Name: Angela Sexton MRN: [id number] DOB: [birth year]  Reason for Call:  Transition of Care Hospital Discharge Call  Contact Status: Patient Contact Status: Complete  Language assistant needed: Interpreter Mode: Interpreter Not Needed        Follow-Up Questions: Do You Have Any Concerns About Your Health As You Heal From Delivery?: No Do You Have Any Concerns About Your Infants Health?: No  Edinburgh Postnatal Depression Scale:  In the Past 7 Days:    PHQ2-9 Depression Scale:     Discharge Follow-up: Edinburgh score requires follow up?: N/A (Patient declines completing at this time. Patient states she has an integrated behavioral health appointment this afternoon, in which she plan to attend. Patient states she has no thoughts of harming herself or someone else.) Patient was advised of the following resources:: Support Group, Breastfeeding Support Group  Post-discharge interventions: Reviewed Newborn Safe Sleep Practices  Steva [name]  02/03/2024 [id number]

## 2024-02-04 NOTE — Patient Instructions (Signed)
 [facility] at [facility] for Women [address] [place], [place] [address] [phone] (main office) [phone] [facility] office)  New Parent Support Groups [email or web address]   [facility]  [address], [place], [place] [address] [phone] or [phone] [facility] 24/7 FOR ANYONE [address], [place], [place]  [phone] Fax: [phone] [email or web address] *Interpreters available *Accepts all insurance and uninsured for Urgent Care needs *Accepts Medicaid and uninsured for outpatient treatment (below)    ONLY FOR [facility]  Below:   Outpatient New Patient Assessment/Therapy Walk-ins:        Monday -Thursday 8am until slots are full.        Every Friday 1pm-4pm  (first come, first served)                   New Patient Psychiatry/Medication Management        Monday-Friday 8am-11am (first come, first served)              For all walk-ins we ask that you arrive by 7:15am, because patients will be seen in the order of arrival.

## 2024-02-10 ENCOUNTER — Encounter: Admitting: Certified Nurse Midwife

## 2024-02-15 NOTE — BH Specialist Note (Unsigned)
 Integrated Behavioral Health via Telemedicine Visit  02/17/2024 Angela Sexton [id number]  Number of Integrated Behavioral Health Clinician visits: 1- Initial Visit  Session Start time: 0848   Session End time: 0929  Total time in minutes: 41  Referring Provider: Cornell Finder, CNM Patient/Family location: Home [facility] Provider location: [facility]  All persons participating in visit: Patient Angela Sexton and [facility] Angela Sexton   Types of Service: Individual psychotherapy and Video visit  I connected with Angela Sexton and/or Angela Sexton's n/a via  Telephone or Video Enabled Telemedicine Application  (Video is Caregility application) and verified that I am speaking with the correct person using two identifiers. Discussed confidentiality: Yes   I discussed the limitations of telemedicine and the availability of in person appointments.  Discussed there is a possibility of technology failure and discussed alternative modes of communication if that failure occurs.  I discussed that engaging in this telemedicine visit, they consent to the provision of behavioral healthcare and the services will be billed under their insurance.  Patient and/or legal guardian expressed understanding and consented to Telemedicine visit: Yes   Presenting Concerns: Patient and/or family reports the following symptoms/concerns: Self-isolating, low motivation to get dressed or get out of the house, negative self-talk, poor appetite when stressed; poor sleep quality/quantity; worries about possible BP issue with headaches and seeing spots/fear of going back to the hospital; concern about breastfeeding issue. Pt is coping best with good support from mom and baby's father; felt good after a brief outdoor walk last week.  Duration of problem: Ongoing; Severity of problem: moderate  Patient and/or Family's Strengths/Protective Factors: Social connections,  Concrete supports in place (healthy food, safe environments, etc.), Sense of purpose, and Physical Health (exercise, healthy diet, medication compliance, etc.)  Goals Addressed: Patient will:  Reduce symptoms of: anxiety, depression, insomnia, mood instability, and stress   Increase knowledge and/or ability of: healthy habits   Demonstrate ability to: Increase healthy adjustment to current life circumstances, Increase adequate support systems for patient/family, and Increase motivation to adhere to plan of care  Progress towards Goals: Ongoing    Interventions: Interventions utilized:  Motivational Interviewing, Psychoeducation and/or Health Education, and Supportive Reflection Standardized Assessments completed: Not Needed   Patient and/or Family Response: Patient agrees with treatment plan.  Clinical Assessment/Diagnosis  Bipolar II disorder (HCC)  Generalized anxiety disorder   Patient may benefit from continued therapeutic intervention and establishing care with ongoing therapy and psychiatry at [facility]   Plan: Follow up with behavioral health clinician on : One week Behavioral recommendations:  -Continue taking Strattera  as prescribed -Continue attempts at healthy self-care (regular meals; adequate rest, as able; allow practical support from supportive people in life) -Accept referral to lactation; attend nurse visit for BP check, as discussed -Continue plan to attend upcoming [facility] appointments -Consider resuming outdoor walks, at least once/week; consider simply sitting outdoors daily, on good-weather days (even one minute) Referral(s): Integrated Art Gallery Manager (In Clinic) and [facility] (LME/Outside Clinic)  I discussed the assessment and treatment plan with the patient and/or parent/guardian. They were provided an opportunity to ask questions and all were answered. They agreed with the plan and demonstrated an understanding of the  instructions.   They were advised to call back or seek an in-person evaluation if the symptoms worsen or if the condition fails to improve as anticipated.  Tanesha Arambula C Locke Barrell, LCSW     01/20/2024    5:14 PM 12/23/2023  4:30 PM 11/20/2023   10:11 AM 09/21/2023    4:05 PM 07/22/2023    5:00 PM  Depression screen PHQ 2/9  Decreased Interest 1 0  1 1  Down, Depressed, Hopeless 0 1  2 2   PHQ - 2 Score 1 1  3 3   Altered sleeping 3 1  3 2   Tired, decreased energy 1 3  3 3   Change in appetite 0 1  0 1  Feeling bad or failure about yourself  0 1  1 3   Trouble concentrating 1 2  1 2   Moving slowly or fidgety/restless 0 0  3 1  Suicidal thoughts 0 0  0 1  PHQ-9 Score 6 9  14 16   Difficult doing work/chores          Information is confidential and restricted. Go to Review Flowsheets to unlock data.      01/20/2024    5:14 PM 12/23/2023    4:30 PM 11/20/2023   10:10 AM 09/21/2023    4:08 PM  GAD 7 : Generalized Anxiety Score  Nervous, Anxious, on Edge 1 2  3   Control/stop worrying 1 2  3   Worry too much - different things 1 2  3   Trouble relaxing 0 2  3  Restless 1 1  1   Easily annoyed or irritable 2 3  3   Afraid - awful might happen 0 1  1  Total GAD 7 Score 6 13  17   Anxiety Difficulty         Information is confidential and restricted. Go to Review Flowsheets to unlock data.

## 2024-02-16 ENCOUNTER — Telehealth: Payer: Self-pay

## 2024-02-16 ENCOUNTER — Ambulatory Visit (INDEPENDENT_AMBULATORY_CARE_PROVIDER_SITE_OTHER): Admitting: Clinical

## 2024-02-16 DIAGNOSIS — F411 Generalized anxiety disorder: Secondary | ICD-10-CM

## 2024-02-16 DIAGNOSIS — F3181 Bipolar II disorder: Secondary | ICD-10-CM | POA: Diagnosis not present

## 2024-02-16 NOTE — Telephone Encounter (Signed)
 Called per message from Jamie. Parent wants to make sure that she has proper latch. Has been experiencing pain beginning of latch. Offered 8:30 am appointment for 02/17/2024. Accepted appointment. [facility] LC asked parent to bring baby hungry, with a bottle of milk, pump and pump parts, and welcome to bring a support person.

## 2024-02-17 ENCOUNTER — Encounter: Admitting: Certified Nurse Midwife

## 2024-02-18 ENCOUNTER — Encounter: Payer: Self-pay | Admitting: Certified Nurse Midwife

## 2024-02-18 ENCOUNTER — Ambulatory Visit

## 2024-02-18 ENCOUNTER — Inpatient Hospital Stay (HOSPITAL_COMMUNITY)
Admission: AD | Admit: 2024-02-18 | Discharge: 2024-02-18 | Disposition: A | Discharge status: Home / Self Care | Attending: Family Medicine | Admitting: Family Medicine

## 2024-02-18 VITALS — BP 139/107 | HR 82 | Ht 68.0 in | Wt 190.0 lb

## 2024-02-18 DIAGNOSIS — R519 Headache, unspecified: Secondary | ICD-10-CM | POA: Diagnosis present

## 2024-02-18 DIAGNOSIS — R0789 Other chest pain: Secondary | ICD-10-CM | POA: Insufficient documentation

## 2024-02-18 DIAGNOSIS — O165 Unspecified maternal hypertension, complicating the puerperium: Secondary | ICD-10-CM | POA: Diagnosis not present

## 2024-02-18 DIAGNOSIS — R9431 Abnormal electrocardiogram [ECG] [EKG]: Secondary | ICD-10-CM | POA: Insufficient documentation

## 2024-02-18 DIAGNOSIS — R1013 Epigastric pain: Secondary | ICD-10-CM | POA: Insufficient documentation

## 2024-02-18 DIAGNOSIS — R030 Elevated blood-pressure reading, without diagnosis of hypertension: Secondary | ICD-10-CM | POA: Diagnosis present

## 2024-02-18 DIAGNOSIS — Z0130 Encounter for examination of blood pressure without abnormal findings: Secondary | ICD-10-CM

## 2024-02-18 LAB — COMPREHENSIVE METABOLIC PANEL WITH GFR
ALT: 18 U/L (ref 0–44)
AST: 17 U/L (ref 15–41)
Albumin: 3.5 g/dL (ref 3.5–5.0)
Alkaline Phosphatase: 65 U/L (ref 38–126)
Anion gap: 9 (ref 5–15)
BUN: 9 mg/dL (ref 6–20)
CO2: 26 mmol/L (ref 22–32)
Calcium: 8.9 mg/dL (ref 8.9–10.3)
Chloride: 106 mmol/L (ref 98–111)
Creatinine, Ser: 0.54 mg/dL (ref 0.44–1.00)
GFR, Estimated: >60 mL/min (ref >60)
Glucose, Bld: 84 mg/dL (ref 70–99)
Potassium: 3.2 mmol/L — ABNORMAL LOW (ref 3.5–5.1)
Sodium: 141 mmol/L (ref 135–145)
Total Bilirubin: 0.7 mg/dL (ref 0.0–1.2)
Total Protein: 7 g/dL (ref 6.5–8.1)

## 2024-02-18 LAB — CBC
HCT: 36.3 % (ref 36.0–46.0)
Hemoglobin: 11.5 g/dL — ABNORMAL LOW (ref 12.0–15.0)
MCH: 27.7 pg (ref 26.0–34.0)
MCHC: 31.7 g/dL (ref 30.0–36.0)
MCV: 87.5 fL (ref 80.0–100.0)
Platelets: 272 K/uL (ref 150–400)
RBC: 4.15 MIL/uL (ref 3.87–5.11)
RDW: 13.4 % (ref 11.5–15.5)
WBC: 7.2 K/uL (ref 4.0–10.5)
nRBC: 0 % (ref 0.0–0.2)

## 2024-02-18 MED ORDER — AMLODIPINE BESYLATE 5 MG PO TABS: 5.0000 mg | ORAL_TABLET | Freq: Every day | ORAL | 0 refills | Status: DC

## 2024-02-18 MED ORDER — ACETAMINOPHEN-CAFFEINE 500-65 MG PO TABS
2.0000 | ORAL_TABLET | Freq: Once | ORAL | Status: AC
  Administered 2024-02-18: 2 via ORAL
  Filled 2024-02-18: qty 2

## 2024-02-18 MED ORDER — AMLODIPINE BESYLATE 5 MG PO TABS
5.0000 mg | ORAL_TABLET | Freq: Every day | ORAL | Status: DC
  Administered 2024-02-18: 5 mg via ORAL
  Filled 2024-02-18: qty 1

## 2024-02-18 NOTE — Progress Notes (Signed)
 Here for BP check . Reports has been having headaches since 107/25. Reports she has headaches everyday =7,8 and takes ibuprofen  or oxycodone  and states that doesn't help HA but it eventually goes away. C/o seeing spots 3-4 days a week , noticing more in right eye.  C/o  intermittent chest pain =8 for about a week - states is sharp when she has it- yesterday was all day.  Denies chest pain at present, c/o HA now =6-7.  No edema noted. BP elevated x 2. Discussed with Dr. Eveline and advised to go to [facility] for evaluation for possible pre-eclampsia. Patient states she has to go home to drop off baby and will go to hospital within the hour. I stressed importance of going to hospital asap and risks of pre-eclampsia. She voices understanding. Report called to [facility] charge. Rock Skip PEAK

## 2024-02-18 NOTE — MAU Provider Note (Signed)
 History     CSN: [id number]  Arrival date and time: 02/18/24 1242   None     Chief Complaint  Patient presents with   Hypertension   HPI Patient is a 33 year old G4, P2 who is 3 weeks postpartum presenting the MAU for elevated blood pressure, headache since delivery.  Patient also reporting seeing spots.  Has been taking Tylenol  and eating thinking that may help the headache but it did not go away.  Also reports chest pain.  OB History     Gravida  4   Para  3   Term  3   Preterm  0   AB  1   Living  3      SAB  1   IAB  0   Ectopic  0   Multiple  0   Live Births  3           Past Medical History:  Diagnosis Date   Allergy    Anemia    Anxiety    Axillary lymphadenopathy 10/09/2020   B12 deficiency 11/09/2020   Bipolar 1 disorder (HCC) 11/26/2022   Bronchogenic cyst 04/21/2014   Formatting of this note might be different from the original.  17mm right  Formatting of this note might be different from the original.  Overview:   17mm right     Formatting of this note might be different from the original.  17mm right     Depression    Eczema    Elevated sed rate 11/09/2020   Facial rash 12/07/2020   Family history of scleroderma 05/08/2021   Folate deficiency 11/09/2020   Hematuria with proteinuria 11/09/2020   Hx of maternal laceration, 4th degree, currently pregnant 06/05/2021   Iron deficiency anemia 11/09/2020   Joint pain 01/19/2021   Pneumonia    infant   Positive ANA (antinuclear antibody) 05/18/2021   Concern for Lupus   Elevated sed rate  Repeat ANA neg  dsDNA neg  SSA/SSB- Negative   Positive ANA (antinuclear antibody) 05/18/2021   Concern for Lupus   Elevated sed rate  Repeat ANA neg  dsDNA neg  SSA/SSB- Negative     Raynaud phenomenon 02/05/2021   Syphilis 05/09/2021   05-08-21  Positive RPR with titer of 1:32--> treated with bicillin  x3   [x]  6 month titer: 1:32 (inappropriate drop) Retreated [name]  x 3  [x]  12 month titer: 1:8  (appropriate drop)  [ ]  18 month titer (12 months after second treatment)          Past Surgical History:  Procedure Laterality Date   WISDOM TOOTH EXTRACTION      Family History  Problem Relation Age of Onset   Asthma Mother    Eczema Mother    Scleroderma Father    Diabetes Father    Melanoma Maternal Grandfather    Lung disease Neg Hx    Colon cancer Neg Hx    Esophageal cancer Neg Hx     Social History   Tobacco Use   Smoking status: Never   Smokeless tobacco: Never  Vaping Use   Vaping status: Never Used  Substance Use Topics   Alcohol use: Yes    Comment: occ   Drug use: Never    Allergies:  Allergies  Allergen Reactions   Sulfa Antibiotics     Nausea/vomiting, weakness    Medications Prior to Admission  Medication Sig Dispense Refill Last Dose/Taking   acetaminophen  (TYLENOL ) 325 MG tablet Take 650 mg by mouth  every 6 (six) hours as needed.   02/18/2024 at 10:00 AM   atomoxetine  (STRATTERA ) 18 MG capsule Take 1 capsule (18 mg total) by mouth daily. 30 capsule 3 02/18/2024 Morning   ibuprofen  (ADVIL ) 600 MG tablet Take 1 tablet (600 mg total) by mouth every 6 (six) hours. 30 tablet 0 02/17/2024   Magnesium  Oxide -Mg Supplement (MAG-OXIDE) 200 MG TABS Take 2 tablets (400 mg total) by mouth at bedtime. If that amount causes loose stools in the am, switch to 200mg  daily at bedtime. 60 tablet 3 02/17/2024 Bedtime   Prenatal Vit-Fe Fumarate-FA (PRENATAL PLUS VITAMIN/MINERAL) 27-1 MG TABS Take 1 tablet by mouth daily. 30 tablet 11 02/17/2024   fluticasone  (FLONASE ) 50 MCG/ACT nasal spray Place 2 sprays into both nostrils daily. (Patient not taking: No sig reported) 32 mL 3 Not Taking   oxyCODONE  (OXY IR/ROXICODONE ) 5 MG immediate release tablet Take 1 tablet (5 mg total) by mouth every 4 (four) hours as needed (pain scale 4-7). 10 tablet 0     Review of Systems  Constitutional:  Negative for fever.  Eyes:  Positive for visual disturbance.  Cardiovascular:   Positive for chest pain.  Gastrointestinal:  Positive for abdominal pain. Negative for nausea and vomiting.  Neurological:  Positive for headaches.   Physical Exam   Blood pressure (!) 141/96, pulse 64, temperature 98.6 F (37 C), resp. rate 18, height 5' 8 (1.727 m), weight 86.2 kg, SpO2 100%, currently breastfeeding.  Physical Exam Vitals and nursing note reviewed.  Constitutional:      Appearance: Normal appearance.  HENT:     Head: Normocephalic and atraumatic.     Nose: No congestion or rhinorrhea.  Eyes:     Extraocular Movements: Extraocular movements intact.  Cardiovascular:     Rate and Rhythm: Normal rate and regular rhythm.     Pulses: Normal pulses.  Pulmonary:     Effort: Pulmonary effort is normal.  Abdominal:     Palpations: Abdomen is soft.     Tenderness: There is no abdominal tenderness.  Musculoskeletal:        General: Normal range of motion.     Cervical back: Normal range of motion.  Skin:    General: Skin is warm.     Capillary Refill: Capillary refill takes less than 2 seconds.  Neurological:     General: No focal deficit present.     Mental Status: She is alert.     Cranial Nerves: No cranial nerve deficit.     Sensory: No sensory deficit.     Motor: No weakness.  Psychiatric:        Mood and Affect: Mood normal.        Behavior: Behavior normal.        Thought Content: Thought content normal.     MAU Course  Procedures  MDM CBC CMP Excedrin tension Amlodipine EKG   Assessment and Plan  Cole Dubas is a 33 year old who was 3 weeks postpartum presenting for persistent headache and elevated blood pressures.  Postpartum hypertension Patient presenting with persistent headache since delivery and elevated blood pressures which is a recent occurrence.  On evaluation his blood pressures 140s/90s.  CBC and CMP within normal limits.  Patient given Excedrin tension with some improvement in the headache.  Started on amlodipine 5 mg.   Patient will be scheduled for BP check on Monday.  Discussed if headache worsens or persist she should be evaluated either here or the clinic.  Discussed return precautions.  Patient discharged home.  Chest pain Patient reporting episodes of chest pain which could be more epigastric in nature.  Likely reflux although she did not have any reflux during pregnancy that she reports.  EKG within normal limits.  Not currently experiencing the pain.  Will continue to monitor.  Sabin Gibeault V Keyonna Comunale 02/18/2024, 4:32 PM

## 2024-02-18 NOTE — Discharge Instructions (Signed)
 It was a pleasure taking care of you today.  Your blood pressures remained elevated throughout the hospital stay.  Your lab work all looked normal.  I have started you on on a blood pressure medication which I want you to take daily.  I sent a message to get you scheduled for a blood pressure check appointment at our office on Monday.  If your headache continues to persist I want you to call the clinic and have an appointment to discuss possible investigation.  You can take Excedrin tension as needed for the headache.  I hope you have a wonderful rest of your day!

## 2024-02-18 NOTE — MAU Note (Signed)
 Angela Sexton is a 33 y.o. at [redacted]w[redacted]d here in MAU reporting: pp vag delivery 01/23/2024. Had elevated b/p in office today. C/O headache and seeing spots. Took tylenol  and ate to see if headache would go away. It did not. Reports some epigastric paina s well   LMP:  Onset of complaint: today Pain score: 6 Vitals:   02/18/24 1311  BP: (!) 133/95  Pulse: 84  Resp: 18  Temp: 98.6 F (37 C)     FHT: n/a  Lab orders placed from triage:

## 2024-02-22 ENCOUNTER — Other Ambulatory Visit: Payer: Self-pay

## 2024-02-22 ENCOUNTER — Ambulatory Visit: Admitting: *Deleted

## 2024-02-22 VITALS — BP 128/96 | HR 76 | Ht 68.0 in | Wt 187.9 lb

## 2024-02-22 DIAGNOSIS — O165 Unspecified maternal hypertension, complicating the puerperium: Secondary | ICD-10-CM

## 2024-02-22 MED ORDER — AMLODIPINE BESYLATE 10 MG PO TABS: 10.0000 mg | ORAL_TABLET | Freq: Every day | ORAL | 1 refills | Status: DC

## 2024-02-22 NOTE — BH Specialist Note (Unsigned)
 Integrated Behavioral Health via Telemedicine Visit  02/23/2024 Angela Sexton [id number]  Number of Integrated Behavioral Health Clinician visits: 1- Initial Visit  Session Start time: 0848   Session End time: 0929  Total time in minutes: 41  Referring Provider: Cornell Finder, CNM Patient/Family location: Home [facility] Provider location: [facility]  All persons participating in visit: Patient Angela Sexton and [name]   Types of Service: Individual psychotherapy and Video visit  I connected with Angela Sexton and/or Angela Sexton's n/a via  Telephone or Video Enabled Telemedicine Application  (Video is Caregility application) and verified that I am speaking with the correct person using two identifiers. Discussed confidentiality: Yes   I discussed the limitations of telemedicine and the availability of in person appointments.  Discussed there is a possibility of technology failure and discussed alternative modes of communication if that failure occurs.  I discussed that engaging in this telemedicine visit, they consent to the provision of behavioral healthcare and the services will be billed under their insurance.  Patient and/or legal guardian expressed understanding and consented to Telemedicine visit: Yes   Presenting Concerns: Patient and/or family reports the following symptoms/concerns: Forgetting to take [facility] medication some days; recent diagnosis of postpartum hypertension; wants to know when her hormone changes will go away; at times, feeling trapped in my own body.  Duration of problem: Ongoing, with postpartum increase; Severity of problem: moderate  Patient and/or Family's Strengths/Protective Factors: Social connections, Concrete supports in place (healthy food, safe environments, etc.), Sense of purpose, and Physical Health (exercise, healthy diet, medication compliance, etc.)  Goals Addressed: Patient  will:  Reduce symptoms of: anxiety, depression, insomnia, mood instability, and stress   Increase knowledge and/or ability of: healthy habits  Demonstrate ability to: Increase motivation to adhere to plan of care and Improve medication compliance  Progress towards Goals: Ongoing    Interventions: Interventions utilized:  Motivational Interviewing, Medication Monitoring, and Supportive Reflection Standardized Assessments completed: Not Needed  Patient and/or Family Response: Patient agrees with treatment plan.  Clinical Assessment/Diagnosis  Bipolar II disorder (HCC)  Generalized anxiety disorder   Patient may benefit from continued therapeutic intervention   Plan: Follow up with behavioral health clinician on : Call Saidi Santacroce at [phone], as needed. Behavioral recommendations:  -Continue taking Strattera  as prescribed -Obtain medicine container today with days of week; keep away from children -Continue BP checks as needed; medical provider has been notified of need for BP cuff -Continue outdoor time on good-weather days -Continue plan to attend upcoming appointments at [facility] outpatient Referral(s): Integrated [employer] (In Clinic)  I discussed the assessment and treatment plan with the patient and/or parent/guardian. They were provided an opportunity to ask questions and all were answered. They agreed with the plan and demonstrated an understanding of the instructions.   They were advised to call back or seek an in-person evaluation if the symptoms worsen or if the condition fails to improve as anticipated.  Warren BROCKS Cathe Bilger, LCSW     01/20/2024    5:14 PM 12/23/2023    4:30 PM 11/20/2023   10:11 AM 09/21/2023    4:05 PM 07/22/2023    5:00 PM  Depression screen PHQ 2/9  Decreased Interest 1 0  1 1  Down, Depressed, Hopeless 0 1  2 2   PHQ - 2 Score 1 1  3 3   Altered sleeping 3 1  3 2   Tired, decreased energy 1 3  3  3  Change in appetite 0 1  0 1  Feeling  bad or failure about yourself  0 1  1 3   Trouble concentrating 1 2  1 2   Moving slowly or fidgety/restless 0 0  3 1  Suicidal thoughts 0 0  0 1  PHQ-9 Score 6 9  14 16   Difficult doing work/chores          Information is confidential and restricted. Go to Review Flowsheets to unlock data.      01/20/2024    5:14 PM 12/23/2023    4:30 PM 11/20/2023   10:10 AM 09/21/2023    4:08 PM  GAD 7 : Generalized Anxiety Score  Nervous, Anxious, on Edge 1 2  3   Control/stop worrying 1 2  3   Worry too much - different things 1 2  3   Trouble relaxing 0 2  3  Restless 1 1  1   Easily annoyed or irritable 2 3  3   Afraid - awful might happen 0 1  1  Total GAD 7 Score 6 13  17   Anxiety Difficulty         Information is confidential and restricted. Go to Review Flowsheets to unlock data.

## 2024-02-22 NOTE — Progress Notes (Signed)
 Here for BP check after starting Norvasc. She denies any headaches but still seeing spots occasionally.  No swelling noted. States she still has occasional chest pain that is less than before , maybe =4. We discussed MAU providers felt chest pain was reflux possibly.  BP elevated today but better than last visit at 138/94, 128/96. Reviewed assessment and history with Nidia Daring, NP who recommends increase Norvasc to 10 mg daily . She also recommends OTC omeprazole for reflux to see if that helps with chest pain. I advised patient other ways to decrease reflux and to go to hospital if severe chest pain or chest pain worsens. I advised to increase to Norvasc 10 mg as ordered. I reviewed her postpartum appointment with her. She voices understanding. Also taken to food market upon request.  Wladyslaw Henrichs,RN

## 2024-02-23 ENCOUNTER — Ambulatory Visit: Admitting: Clinical

## 2024-02-23 DIAGNOSIS — F411 Generalized anxiety disorder: Secondary | ICD-10-CM

## 2024-02-23 DIAGNOSIS — O165 Unspecified maternal hypertension, complicating the puerperium: Secondary | ICD-10-CM

## 2024-02-23 DIAGNOSIS — F3181 Bipolar II disorder: Secondary | ICD-10-CM | POA: Diagnosis not present

## 2024-02-23 MED ORDER — ADULT BLOOD PRESSURE CUFF LG KIT: 1.0000 [IU] | PACK | Freq: Two times a day (BID) | 0 refills | Status: AC

## 2024-02-23 NOTE — Patient Instructions (Signed)
 [facility] at [facility] [address] [place], [place] [address] [phone] (main office) [phone] (Adalina Dopson's office)

## 2024-02-23 NOTE — Addendum Note (Signed)
 Addended by: Baylen Buckner M on: 02/23/2024 06:22 PM   Modules accepted: Orders

## 2024-03-02 ENCOUNTER — Other Ambulatory Visit: Payer: Self-pay

## 2024-03-02 ENCOUNTER — Encounter: Payer: Self-pay | Admitting: Certified Nurse Midwife

## 2024-03-02 ENCOUNTER — Ambulatory Visit: Admitting: Certified Nurse Midwife

## 2024-03-02 DIAGNOSIS — O165 Unspecified maternal hypertension, complicating the puerperium: Secondary | ICD-10-CM

## 2024-03-02 DIAGNOSIS — F53 Postpartum depression: Secondary | ICD-10-CM

## 2024-03-02 DIAGNOSIS — F32A Depression, unspecified: Secondary | ICD-10-CM

## 2024-03-02 DIAGNOSIS — F419 Anxiety disorder, unspecified: Secondary | ICD-10-CM

## 2024-03-02 NOTE — Progress Notes (Signed)
See student note

## 2024-03-02 NOTE — Progress Notes (Signed)
 Post Partum Visit Note  Angela Sexton is a 33 y.o. [phone] female who presents for a postpartum visit. She is 5 weeks 4 days postpartum following a normal spontaneous vaginal delivery.  I have fully reviewed the prenatal and intrapartum course. The delivery was at 37 gestational weeks.  Anesthesia: epidural. Postpartum course has been okay. Baby is doing well. Baby is feeding by breast. Bleeding no bleeding. Bowel function is normal. Bladder function is normal. Patient is sexually active. Contraception method is none. Postpartum depression screening: positive.   Upstream - 03/02/24 1113       Pregnancy Intention Screening   Does the patient want to become pregnant in the next year? No    Does the patient's partner want to become pregnant in the next year? No    Would the patient like to discuss contraceptive options today? No      Contraception Wrap Up   Current Method No Contraceptive Precautions    End Method No Contraception Precautions    Contraception Counseling Provided No    How was the end contraceptive method provided? N/A         The pregnancy intention screening data noted above was reviewed. Potential methods of contraception were discussed. The patient elected to proceed with No Contraception Precautions.   Edinburgh Postnatal Depression Scale - 03/02/24 1115       Edinburgh Postnatal Depression Scale:  In the Past 7 Days   I have been able to laugh and see the funny side of things. 0    I have looked forward with enjoyment to things. 1    I have blamed myself unnecessarily when things went wrong. 0    I have been anxious or worried for no good reason. 2    I have felt scared or panicky for no good reason. 2    Things have been getting on top of me. 2    I have been so unhappy that I have had difficulty sleeping. 2    I have felt sad or miserable. 3    I have been so unhappy that I have been crying. 2    The thought of harming myself has occurred to me. 0     Edinburgh Postnatal Depression Scale Total 14         Health Maintenance Due  Topic Date Due   HPV VACCINES (1 - 3-dose SCDM series) Never done   Influenza Vaccine  Never done   COVID-19 Vaccine (5 - 2025-26 season) 12/21/2023    The following portions of the patient's history were reviewed and updated as appropriate: allergies, current medications, past family history, past medical history, past social history, past surgical history, and problem list.  Review of Systems Pertinent items are noted in HPI.  Objective:  BP (!) 133/98   Pulse 84   Wt 82.1 kg   LMP 05/08/2023 (Exact Date)   Breastfeeding Yes   BMI 27.52 kg/m    General:  alert and cooperative   Breasts:   Normal  Lungs: Normal respiratory effort  Heart:  Normal heart rate noted  Abdomen: Soft, non-tender   GU exam:  Exam deferred       Assessment:    1. Encounter for routine postpartum follow-up - Discussed various contraceptive options with patient including POP's Nexplanon, DepoProvera, and IUD. Patient leaning more towards Nexplanon or IUD.  - Patient to follow up for annual GYN visit or as needed for any other GYN concerns  2. Anxiety  and depression - Patient to start taking Zoloft  25mg  for symptoms related to anxiety and depression.  3. Postpartum hypertension - Patient BP within normal range today.  - Patient to continue taking amlodipine 10 mg Plan:   Essential components of care per ACOG recommendations:  1.  Mood and well being: Patient with positive depression screening today. Reviewed local resources for support. Patient currently seeking therapy and counseling with previous visit on 02/23/2024. Patient open to restarting Zoloft  25mg . - Patient tobacco use? No.   - hx of drug use? No.    2. Infant care and feeding:  -Patient currently breastmilk feeding? Yes. Discussed returning to work and pumping.  -Social determinants of health (SDOH) reviewed in EPIC. No concerns.  3. Sexuality,  contraception and birth spacing - Reviewed reproductive life planning. Reviewed contraceptive methods based on pt preferences and effectiveness. Given patient past experienced with Nexplanon, she is deciding between Nexplanon or IUD. - Discussed birth spacing of 18 months  4. Sleep and fatigue -Encouraged family/partner/community support of 4 hrs of uninterrupted sleep to help with mood and fatigue  5. Physical Recovery  - Discussed patients delivery and complications. She describes her labor as good. - Patient had a Vaginal, no problems at delivery. Patient had no laceration. Perineal healing reviewed. Patient expressed understanding - Patient has urinary incontinence? No. - Patient is safe to resume physical and sexual activity  6.  Health Maintenance - HM due items addressed Yes - Last pap smear  Diagnosis  Date Value Ref Range Status  07/22/2023   Final   - Negative for intraepithelial lesion or malignancy (NILM)   Pap smear not done at today's visit.  -Breast Cancer screening indicated? No.   7. Chronic Disease/Pregnancy Condition follow up: Hypertension.   - PCP follow up  Tommy Daring, WHNP-S [facility] for [facility], [facility]

## 2024-03-03 MED ORDER — SERTRALINE HCL 25 MG PO TABS: 25.0000 mg | ORAL_TABLET | Freq: Every day | ORAL | 6 refills | Status: DC

## 2024-03-04 ENCOUNTER — Ambulatory Visit (INDEPENDENT_AMBULATORY_CARE_PROVIDER_SITE_OTHER): Admitting: Mental Health

## 2024-03-04 DIAGNOSIS — F411 Generalized anxiety disorder: Secondary | ICD-10-CM

## 2024-03-04 DIAGNOSIS — F3181 Bipolar II disorder: Secondary | ICD-10-CM

## 2024-03-04 NOTE — Progress Notes (Signed)
   THERAPIST PROGRESS NOTE Virtual Visit via Video Note  I connected with Zofia Anthis on 03/04/24 at  9:00 AM EST by a video enabled telemedicine application and verified that I am speaking with the correct person using two identifiers.  Location: Patient: home address on file Provider: remote office   I discussed the limitations of evaluation and management by telemedicine and the availability of in person appointments. The patient expressed understanding and agreed to proceed.  I discussed the assessment and treatment plan with the patient. The patient was provided an opportunity to ask questions and all were answered. The patient agreed with the plan and demonstrated an understanding of the instructions.   The patient was advised to call back or seek an in-person evaluation if the symptoms worsen or if the condition fails to improve as anticipated.  I provided 55 minutes of non-face-to-face time during this encounter.   Ty Bernice Savant, [facility]   Session Time: 9:07am ( 55 minutes)  Participation Level: Active  Behavioral Response: CasualAlertDepressed and Dysphoric  Type of Therapy: Individual Therapy  Treatment Goals addressed:  Stabilizing my moods. Amazing will regulate moods AEB development of x 3 effective emotional regulation skills and x 3 distress tolerance skills within the next 90 days.   ProgressTowards Goals: Not Progressing  Interventions: CBT and Supportive  Summary: Gerlean Cid is a 33 y.o. female who presents with dx of bipolarII disorder and generalized anxiety disorder. Presents for session sitting in car, alert and oriented; mood and affect anxious dysphoric. Speech clear and coherent at normal rate and tone. Shares recent updates since having daughter and shares for moods to have been low with hx of post partum depression. Shares to have received prescription antidepressant for support and shares supports in place with child's father and mother.  Shares current stressors with family dynamics with difficulty putting in place boundaries with parents. Notes stressor with partner with concern for pets in the home with addition stressor of moving. Explores with therapist working to engage in effective communication and setting boundaries with family members and engaging in conflict resolution skills. Shares hx of failing to attend to her needs in effort of supporting others. Denies safety concerns.    Suicidal/Homicidal: Nowithout intent/plan  Therapist Response:  Therapist engaged Kory in tele-therapy session. Completed check in and assessed for current level of functioning, sxs management and level of stressors. Explored current concerns and thoughts and feelings birth of daughter. Explores hx of post partum depression and current sxs present. Assessed for safety concerns, ability to cope and supports in place. Provided safe space to share current stressors and explored engaging in effective communication and ability to engage in conflict resolution and setting of boundaries. Reviewed session and provided follow up.   Plan: Return again in  x 2 weeks.  Diagnosis: Bipolar II disorder (HCC)  Generalized anxiety disorder  Collaboration of Care: Other None  Patient/Guardian was advised Release of Information must be obtained prior to any record release in order to collaborate their care with an outside provider. Patient/Guardian was advised if they have not already done so to contact the registration department to sign all necessary forms in order for us  to release information regarding their care.   Consent: Patient/Guardian gives verbal consent for treatment and assignment of benefits for services provided during this visit. Patient/Guardian expressed understanding and agreed to proceed.   Ty Bernice Willamina, [facility] 03/04/2024

## 2024-03-07 DIAGNOSIS — Z0289 Encounter for other administrative examinations: Secondary | ICD-10-CM

## 2024-03-16 ENCOUNTER — Ambulatory Visit (INDEPENDENT_AMBULATORY_CARE_PROVIDER_SITE_OTHER): Admitting: Mental Health

## 2024-03-16 DIAGNOSIS — F3181 Bipolar II disorder: Secondary | ICD-10-CM

## 2024-03-16 DIAGNOSIS — F411 Generalized anxiety disorder: Secondary | ICD-10-CM | POA: Diagnosis not present

## 2024-03-16 NOTE — Progress Notes (Signed)
   THERAPIST PROGRESS NOTE Virtual Visit via Video Note  I connected with Angela Sexton on 03/16/24 at 11:00 AM EST by a video enabled telemedicine application and verified that I am speaking with the correct person using two identifiers.  Location: Patient: address on  file Provider: office   I discussed the limitations of evaluation and management by telemedicine and the availability of in person appointments. The patient expressed understanding and agreed to proceed.  I discussed the assessment and treatment plan with the patient. The patient was provided an opportunity to ask questions and all were answered. The patient agreed with the plan and demonstrated an understanding of the instructions.   The patient was advised to call back or seek an in-person evaluation if the symptoms worsen or if the condition fails to improve as anticipated.  I provided 50 minutes of non-face-to-face time during this encounter.   Ty Angela Sexton, [facility]   Session Time: 11:10am (50)  Participation Level: Active  Behavioral Response: CasualAlertAnxious  Type of Therapy: Individual Therapy  Treatment Goals addressed:  Stabilizing my moods. Angela Sexton will regulate moods AEB development of x 3 effective emotional regulation skills and x 3 distress tolerance skills within the next 90 days.   ProgressTowards Goals: Progressing  Interventions: CBT and Supportive  Summary: Angela Sexton is a 33 y.o. female who presents with dx of bipolarII disorder and generalized anxiety disorder. Presents for session sitting in car, alert and oriented; mood and affect anxious dysphoric. Speech clear and coherent at normal rate and tone. shares with therapist recent events and traveling to support partner and his children. Shares with therapist anxious feelings present with various events in her life to be up in the air with no clear plan, causing distress and anxiousness. Shares feelings related to being post partum  and expectations of self. Able to engage with therapist and explores ability to hold realistic expectations of self and thoughts of need for clear plan for things without all the facts. Explores ability to utilize supports available and being mindful of self-care and sue of emotional regulation skills. Identities self-soothing skills. Shares desire for stability. Denies safety concerns.   Suicidal/Homicidal: No without intent/plan  Therapist Response: Therapist engaged Angela Sexton in tele-therapy session. Completed check in and assessed for current level of functioning, sxs management and level of stressors. Explored current concerns and provided safe space to share thoughts and feelings. Supported in identifying feelings of discomfort with ambiguity and working to identify plan for realistic anxiety. Supported in identifying areas in which not all facts are obtained for plan and ability to compartmentalize and engage in coping skills to manage distress and anxiousness. Explored presence of supports and engagement in self-care. Reviewed ability to set boundaries with self and others as needed. Reviewed session and provided follow up.   Plan: Return again in 5 weeks.  Diagnosis: Generalized anxiety disorder  Bipolar II disorder (HCC)  Collaboration of Care: Other none  Patient/Guardian was advised Release of Information must be obtained prior to any record release in order to collaborate their care with an outside provider. Patient/Guardian was advised if they have not already done so to contact the registration department to sign all necessary forms in order for us  to release information regarding their care.   Consent: Patient/Guardian gives verbal consent for treatment and assignment of benefits for services provided during this visit. Patient/Guardian expressed understanding and agreed to proceed.   Ty Angela Sexton, [facility] 03/16/2024

## 2024-03-29 ENCOUNTER — Encounter: Payer: Self-pay | Admitting: Certified Nurse Midwife

## 2024-04-06 ENCOUNTER — Other Ambulatory Visit: Payer: Self-pay

## 2024-04-06 ENCOUNTER — Ambulatory Visit: Payer: Self-pay | Admitting: Certified Nurse Midwife

## 2024-04-06 ENCOUNTER — Encounter: Payer: Self-pay | Admitting: Certified Nurse Midwife

## 2024-04-06 ENCOUNTER — Other Ambulatory Visit (HOSPITAL_COMMUNITY)
Admission: RE | Admit: 2024-04-06 | Discharge: 2024-04-06 | Disposition: A | Source: Ambulatory Visit | Attending: Certified Nurse Midwife | Admitting: Certified Nurse Midwife

## 2024-04-06 VITALS — BP 117/78 | HR 74 | Wt 185.2 lb

## 2024-04-06 DIAGNOSIS — Z975 Presence of (intrauterine) contraceptive device: Secondary | ICD-10-CM

## 2024-04-06 DIAGNOSIS — Z3202 Encounter for pregnancy test, result negative: Secondary | ICD-10-CM | POA: Diagnosis not present

## 2024-04-06 DIAGNOSIS — N898 Other specified noninflammatory disorders of vagina: Secondary | ICD-10-CM | POA: Insufficient documentation

## 2024-04-06 DIAGNOSIS — Z59819 Housing instability, housed unspecified: Secondary | ICD-10-CM | POA: Diagnosis not present

## 2024-04-06 DIAGNOSIS — Z30430 Encounter for insertion of intrauterine contraceptive device: Secondary | ICD-10-CM | POA: Diagnosis not present

## 2024-04-06 LAB — POCT PREGNANCY, URINE: Preg Test, Ur: NEGATIVE

## 2024-04-06 MED ORDER — IBUPROFEN 800 MG PO TABS
800.0000 mg | ORAL_TABLET | Freq: Once | ORAL | Status: AC
  Administered 2024-04-06: 12:00:00 800 mg via ORAL

## 2024-04-06 MED ORDER — LEVONORGESTREL 20.1 MCG/DAY IU IUD
1.0000 | INTRAUTERINE_SYSTEM | Freq: Once | INTRAUTERINE | Status: AC
  Administered 2024-04-06: 12:00:00 1 via INTRAUTERINE

## 2024-04-08 LAB — CERVICOVAGINAL ANCILLARY ONLY
Bacterial Vaginitis (gardnerella): NEGATIVE
Candida Glabrata: NEGATIVE
Candida Vaginitis: POSITIVE — AB
Comment: NEGATIVE
Comment: NEGATIVE
Comment: NEGATIVE

## 2024-04-08 LAB — GENITAL MYCOPLASMAS NAA, SWAB
Mycoplasma genitalium NAA: NEGATIVE
Mycoplasma hominis NAA: NEGATIVE
Ureaplasma spp NAA: POSITIVE — AB

## 2024-04-11 ENCOUNTER — Encounter: Payer: Self-pay | Admitting: Certified Nurse Midwife

## 2024-04-11 ENCOUNTER — Encounter: Payer: Self-pay | Admitting: General Practice

## 2024-04-12 DIAGNOSIS — Z975 Presence of (intrauterine) contraceptive device: Secondary | ICD-10-CM | POA: Insufficient documentation

## 2024-04-12 NOTE — Progress Notes (Signed)
 History:  [name] is a 33 y.o. [phone] who presents to clinic today for BP and mood check, 8 weeks from delivery. Had postpartum hypertension (continued on norvasc ) and increase in anxiety (restarted on low dose strattera ). Also desires IUD placement today. Last IC about two weeks ago, a few days before her last period. Also endorses discharge with an odor. Has battled recurrent BV.  GAD-7 very high today including question about potential for self-harm. When invited to discuss, pt divulged very difficult living situation - she and her family are to be evicted on 12/22 due to circumstances outside their control including our office not getting short term disability paperwork completed in time (pt notified of $15 charge via MyChart but did not see the message until after the deadline had passed). Sharp increase in anxiety entirely circumstantial.   The following portions of the patient's history were reviewed and updated as appropriate: allergies, current medications, family history, past medical history, social history, past surgical history and problem list.  Review of Systems:  Pertinent items noted in HPI and remainder of comprehensive ROS otherwise negative.   Objective:  Physical Exam BP 117/78   Pulse 74   Wt 185 lb 3.2 oz (84 kg)   Breastfeeding Yes   BMI 28.16 kg/m  Physical Exam Vitals and nursing note reviewed.  Constitutional:      Appearance: Normal appearance. She is normal weight.  Cardiovascular:     Rate and Rhythm: Normal rate and regular rhythm.  Pulmonary:     Effort: Pulmonary effort is normal.  Abdominal:     General: There is no distension.     Palpations: Abdomen is soft.     Tenderness: There is no abdominal tenderness.  Genitourinary:    General: Normal vulva.     Comments: IUD inserted. Musculoskeletal:        General: Normal range of motion.  Skin:    General: Skin is warm and dry.     Capillary Refill: Capillary refill takes less than 2  seconds.  Neurological:     Mental Status: She is alert and oriented to person, place, and time.  Psychiatric:        Mood and Affect: Mood normal.        Behavior: Behavior normal.        Thought Content: Thought content normal.        Judgment: Judgment normal.    Labs and Imaging Pregnancy test negative  IUD Insertion Procedure Note Patient identified, informed consent performed, consent signed.   Discussed risks of irregular bleeding, cramping, infection, malpositioning or misplacement of the IUD outside the uterus which may require further procedure such as laparoscopy. Also discussed >99% contraception efficacy, increased risk of ectopic pregnancy with failure of method.   Emphasized that this did not protect against STIs, condoms recommended during all sexual encounters. Time out was performed.  Chaperone present.  Urine pregnancy test negative.  Speculum placed in the vagina.  Cervix visualized.  Cleaned with Betadine x 2.  Grasped anteriorly with a single tooth tenaculum. Liletta  IUD placed per manufacturer's recommendations.  Strings trimmed to 3 cm. Tenaculum was removed, good hemostasis noted.  Patient tolerated procedure well.   Assessment & Plan:  1. Encounter for IUD insertion (Primary) - given post-procedure instructions.   - Advised to have backup contraception for one week.  - Advised to check IUD strings periodically  - levonorgestrel  (LILETTA ) 20.1 MCG/DAY IUD 1 each - ibuprofen  (ADVIL ) tablet 800 mg  2.  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 Housing situation unstable - Discussed options with patient. Unable to pay back rent by Monday and eviction already in process. - Plan to move into an extended stay hotel until she and husband return to work (right after Christmas) and have time to secure more stable housing. Extreme concern they will not be able to afford this option either. - Contacted [facility]  Patient Assistance Fund and arranged to provide assistance for hotel stay.  Follow up in 4 weeks for string, BP and mood check.  Future Appointments  Date Time Provider Department Center  05/02/2024 10:00 AM Ty Bernice RAMAN [facility] None  05/18/2024  8:35 AM Vannie Cornell SAUNDERS, CNM [facility]  05/24/2024  4:00 PM Ty Bernice RAMAN [facility] None    Vannie Cornell SAUNDERS, [place] 04/12/2024 9:21 PM

## 2024-04-21 ENCOUNTER — Other Ambulatory Visit: Payer: Self-pay | Admitting: Certified Nurse Midwife

## 2024-04-21 DIAGNOSIS — N921 Excessive and frequent menstruation with irregular cycle: Secondary | ICD-10-CM

## 2024-04-21 NOTE — Progress Notes (Signed)
 Pt having cramping/bleeding after IUD placement, placing order for TVUS to check placement.  Cornell Finder, CNM, MSN, IBCLC Certified Nurse Midwife, [facility]

## 2024-04-22 ENCOUNTER — Ambulatory Visit: Admission: EM | Admit: 2024-04-22 | Discharge: 2024-04-22 | Disposition: A | Discharge status: Home / Self Care

## 2024-04-22 NOTE — ED Triage Notes (Signed)
 [facility] to [facility] for [facility]UD removal. Per Christopher, Angela [facility]xton patient needs to go to the ED since [facility] does not perform IUD removals. Patient verbalized understanding.

## 2024-04-25 ENCOUNTER — Telehealth: Payer: Self-pay

## 2024-04-25 NOTE — Telephone Encounter (Addendum)
 Called pt to let her know we will call centralized scheduling to schedule pelvic ultrasound, she prefers Saturdays or after 4pm on weekdays due to her job.  Informed pt I will send her a Mychart message of appointment details, she verbalized understanding.   Scheduled 04/30/24 at 10:00 at [facility] location, message sent to pt with details.  [place], RN    ----- Message from Cornell JONELLE Finder sent at 04/21/2024 11:17 AM EST ----- Regarding: Needs U/S Scheduled Please call to get her pelvic u/s scheduled to check for IUD placement. Thank you! Jam

## 2024-04-27 ENCOUNTER — Encounter: Payer: Self-pay | Admitting: Certified Nurse Midwife

## 2024-04-27 ENCOUNTER — Ambulatory Visit: Admitting: Certified Nurse Midwife

## 2024-04-27 ENCOUNTER — Other Ambulatory Visit: Payer: Self-pay

## 2024-04-27 VITALS — BP 145/97 | HR 87 | Wt 187.0 lb

## 2024-04-27 DIAGNOSIS — N760 Acute vaginitis: Secondary | ICD-10-CM

## 2024-04-27 DIAGNOSIS — Z30431 Encounter for routine checking of intrauterine contraceptive device: Secondary | ICD-10-CM

## 2024-04-27 DIAGNOSIS — B3731 Acute candidiasis of vulva and vagina: Secondary | ICD-10-CM

## 2024-04-27 DIAGNOSIS — B9689 Other specified bacterial agents as the cause of diseases classified elsewhere: Secondary | ICD-10-CM | POA: Diagnosis not present

## 2024-04-27 MED ORDER — DOXYCYCLINE HYCLATE 100 MG PO TABS: 100.0000 mg | ORAL_TABLET | Freq: Two times a day (BID) | ORAL | 0 refills | Status: DC

## 2024-04-27 MED ORDER — FLUCONAZOLE 150 MG PO TABS: 150.0000 mg | ORAL_TABLET | Freq: Every day | ORAL | 0 refills | Status: DC

## 2024-04-28 ENCOUNTER — Ambulatory Visit
Admission: EM | Admit: 2024-04-28 | Discharge: 2024-04-28 | Disposition: A | Discharge status: Home / Self Care | Attending: Family Medicine | Admitting: Family Medicine

## 2024-04-28 DIAGNOSIS — B9789 Other viral agents as the cause of diseases classified elsewhere: Secondary | ICD-10-CM | POA: Diagnosis not present

## 2024-04-28 DIAGNOSIS — J988 Other specified respiratory disorders: Secondary | ICD-10-CM

## 2024-04-28 LAB — POCT URINALYSIS DIP (DEVICE)
Bilirubin Urine: NEGATIVE
Glucose, UA: NEGATIVE mg/dL
Hgb urine dipstick: NEGATIVE
Leukocytes,Ua: NEGATIVE
Nitrite: NEGATIVE
Protein, ur: 30 mg/dL — AB
Specific Gravity, Urine: >=1.03 (ref 1.005–1.030)
Urobilinogen, UA: 1 mg/dL (ref 0.0–1.0)
pH: 6.5 (ref 5.0–8.0)

## 2024-04-28 LAB — POC COVID19/FLU A&B COMBO
Covid Antigen, POC: NEGATIVE
Influenza A Antigen, POC: NEGATIVE
Influenza B Antigen, POC: NEGATIVE

## 2024-04-28 MED ORDER — CETIRIZINE HCL 10 MG PO TABS: 10.0000 mg | ORAL_TABLET | Freq: Every day | ORAL | 0 refills | Status: DC

## 2024-04-28 MED ORDER — IBUPROFEN 600 MG PO TABS: 600.0000 mg | ORAL_TABLET | Freq: Four times a day (QID) | ORAL | 0 refills | Status: DC | PRN

## 2024-04-28 MED ORDER — PSEUDOEPHEDRINE HCL 30 MG PO TABS: 30.0000 mg | ORAL_TABLET | Freq: Three times a day (TID) | ORAL | 0 refills | Status: AC | PRN

## 2024-04-28 NOTE — Discharge Instructions (Signed)
 We will manage this as a viral illness. For sore throat or cough try using a honey-based tea. Use 3 teaspoons of honey with juice squeezed from half lemon. Place shaved pieces of ginger into 1/2-1 cup of water and warm over stove top. Then mix the ingredients and repeat every 4 hours as needed. Please take ibuprofen 600mg  every 6 hours with food alternating with OR taken together with Tylenol 500mg -650mg  every 6 hours for throat pain, fevers, aches and pains. Hydrate very well with at least 2 liters of water. Eat light meals such as soups (chicken and noodles, vegetable, chicken and wild rice).  Do not eat foods that you are allergic to.  Taking an antihistamine like Zyrtec (10mg  daily) can help against postnasal drainage, sinus congestion which can cause sinus pain, sinus headaches, throat pain, painful swallowing, coughing.  You can take this together with pseudoephedrine (Sudafed) at a dose of 30mg  3 times a day or twice daily as needed for the same kind of nasal drip, congestion.

## 2024-04-28 NOTE — ED Triage Notes (Signed)
 Pt c/o body aches, fever, HA, vomited x 2 today-sx started this am-last dose tylenol  1pm-NAD-steady gait

## 2024-04-28 NOTE — ED Provider Notes (Signed)
 " [facility], Television/film[facility] - [facility]  Note:  This document was prepared using Conservation officer, historic buildings and may include unintentional dictation errors.  MRN: [id number] DOB: 28-Oct-1990  Subjective:   Angela Sexton is a 34 y.o. female presenting for 1 day history of fever, body aches, posterior headaches, neck pain. No smoking of any kind including cigarettes, cigars, vaping, marijuana use.  No cough, chest pain, shob, wheezing.  Has had 1 sick contact with her young daughter who has similar respiratory symptoms.  Current Outpatient Medications  Medication Instructions   amLODipine  (NORVASC ) 10 mg, Oral, Daily   atomoxetine  (STRATTERA ) 18 mg, Oral, Daily   Blood Pressure Monitoring (ADULT BLOOD PRESSURE CUFF LG) KIT 1 Units, Does not apply, 2 times daily   doxycycline  (VIBRA -TABS) 100 mg, Oral, 2 times daily   fluconazole  (DIFLUCAN ) 150 mg, Oral, Daily, Repeat dose in 3 days.   Mag-Oxide 400 mg, Oral, Daily at bedtime, If that amount causes loose stools in the am, switch to 200mg  daily at bedtime.   Prenatal Vit-Fe Fumarate-FA (PRENATAL PLUS VITAMIN/MINERAL) 27-1 MG TABS 1 tablet, Oral, Daily   sertraline  (ZOLOFT ) 25 mg, Oral, Daily    Allergies[1]  Past Medical History:  Diagnosis Date   Allergy    Anemia    Anxiety    Axillary lymphadenopathy 10/09/2020   B12 deficiency 11/09/2020   Bipolar 1 disorder (HCC) 11/26/2022   Bronchogenic cyst 04/21/2014   Formatting of this note might be different from the original.  17mm right  Formatting of this note might be different from the original.  Overview:   17mm right     Formatting of this note might be different from the original.  17mm right     Depression    Eczema    Elevated sed rate 11/09/2020   Facial rash 12/07/2020   Family history of scleroderma 05/08/2021   Folate deficiency 11/09/2020   Hematuria with proteinuria 11/09/2020   Hx of maternal laceration, 4th degree, currently pregnant 06/05/2021   Iron deficiency  anemia 11/09/2020   Joint pain 01/19/2021   Pneumonia    infant   Positive ANA (antinuclear antibody) 05/18/2021   Concern for Lupus   Elevated sed rate  Repeat ANA neg  dsDNA neg  SSA/SSB- Negative   Positive ANA (antinuclear antibody) 05/18/2021   Concern for Lupus   Elevated sed rate  Repeat ANA neg  dsDNA neg  SSA/SSB- Negative     Raynaud phenomenon 02/05/2021   Syphilis 05/09/2021   05-08-21  Positive RPR with titer of 1:32--> treated with bicillin  x3   [x]  6 month titer: 1:32 (inappropriate drop) Retreated [name]  x 3  [x]  12 month titer: 1:8 (appropriate drop)  [ ]  18 month titer (12 months after second treatment)           Past Surgical History:  Procedure Laterality Date   WISDOM TOOTH EXTRACTION      Family History  Problem Relation Age of Onset   Asthma Mother    Eczema Mother    Scleroderma Father    Diabetes Father    Melanoma Maternal Grandfather    Lung disease Neg Hx    Colon cancer Neg Hx    Esophageal cancer Neg Hx     Social History   Occupational History   Not on file  Tobacco Use   Smoking status: Never   Smokeless tobacco: Never  Vaping Use   Vaping status: Never Used  Substance and Sexual Activity   Alcohol use:  Yes    Comment: occ   Drug use: Never   Sexual activity: Not on file     ROS   Objective:   Vitals: BP (!) 119/55 (BP Location: Right Arm)   Pulse 97   Temp 99.8 F (37.7 C) (Oral)   Resp 16   SpO2 97%   Breastfeeding Yes   Physical Exam Constitutional:      General: She is not in acute distress.    Appearance: Normal appearance. She is well-developed and normal weight. She is not ill-appearing, toxic-appearing or diaphoretic.  HENT:     Head: Normocephalic and atraumatic.     Right Ear: Tympanic membrane, ear canal and external ear normal. No drainage or tenderness. No middle ear effusion. There is no impacted cerumen. Tympanic membrane is not erythematous or bulging.     Left Ear: Tympanic membrane, ear canal and  external ear normal. No drainage or tenderness.  No middle ear effusion. There is no impacted cerumen. Tympanic membrane is not erythematous or bulging.     Nose: Nose normal. No congestion or rhinorrhea.     Mouth/Throat:     Mouth: Mucous membranes are moist. No oral lesions.     Pharynx: No pharyngeal swelling, oropharyngeal exudate, posterior oropharyngeal erythema or uvula swelling.     Tonsils: No tonsillar exudate or tonsillar abscesses.  Eyes:     General: No scleral icterus.       Right eye: No discharge.        Left eye: No discharge.     Extraocular Movements: Extraocular movements intact.     Right eye: Normal extraocular motion.     Left eye: Normal extraocular motion.     Conjunctiva/sclera: Conjunctivae normal.  Neck:     Meningeal: Brudzinski's sign and Kernig's sign absent.  Cardiovascular:     Rate and Rhythm: Normal rate and regular rhythm.     Heart sounds: Normal heart sounds. No murmur heard.    No friction rub. No gallop.  Pulmonary:     Effort: Pulmonary effort is normal. No respiratory distress.     Breath sounds: No stridor. No wheezing, rhonchi or rales.  Chest:     Chest wall: No tenderness.  Musculoskeletal:     Cervical back: Normal range of motion and neck supple.  Lymphadenopathy:     Cervical: No cervical adenopathy.  Skin:    General: Skin is warm and dry.  Neurological:     General: No focal deficit present.     Mental Status: She is alert and oriented to person, place, and time.     Cranial Nerves: No cranial nerve deficit.     Motor: No weakness.     Coordination: Coordination normal.     Gait: Gait normal.  Psychiatric:        Mood and Affect: Mood normal.        Behavior: Behavior normal.     Results for orders placed or performed during the hospital encounter of 04/28/24 (from the past 24 hours)  POC Covid19/Flu A&B Antigen     Status: None   Collection Time: 04/28/24  6:50 PM  Result Value Ref Range   Influenza A Antigen, POC  Negative Negative   Influenza B Antigen, POC Negative Negative   Covid Antigen, POC Negative Negative    Assessment and Plan :   PDMP not reviewed this encounter.  1. Viral respiratory infection      Deferred imaging given clear pulmonary exam. Suspect viral URI,  viral syndrome. Physical exam findings reassuring and vital signs stable for discharge. Advised supportive care, offered symptomatic relief. Counseled patient on potential for adverse effects with medications prescribed/recommended today, ER and return-to-clinic precautions discussed, patient verbalized understanding.       [1]  Allergies Allergen Reactions   Sulfa Antibiotics     Nausea/vomiting, weakness     Christopher Savannah, PA-C 04/28/24 1946  "

## 2024-04-30 ENCOUNTER — Ambulatory Visit (HOSPITAL_BASED_OUTPATIENT_CLINIC_OR_DEPARTMENT_OTHER)

## 2024-05-01 NOTE — Progress Notes (Signed)
 History:  [name] is a 34 y.o. [phone] who presents to clinic today for evaluation of IUD placement. Since a few days after her IUD placement, she had intermittent heavy bleeding with severe cramping, all eased about two days prior to this visit. Wants IUD out if it is easily visualized in the cervix, amenable to keeping it and going to pelvic ultrasound to check placement if needed. No other physical complaints.   The following portions of the patient's history were reviewed and updated as appropriate: allergies, current medications, family history, past medical history, social history, past surgical history and problem list.  Review of Systems:  Pertinent items noted in HPI and remainder of comprehensive ROS otherwise negative.    Objective:  Physical Exam BP (!) 145/97   Pulse 87   Wt 187 lb (84.8 kg)   Breastfeeding Yes   BMI 28.43 kg/m  Physical Exam Vitals and nursing note reviewed.  Constitutional:      Appearance: Normal appearance. She is normal weight.  Cardiovascular:     Rate and Rhythm: Normal rate and regular rhythm.  Pulmonary:     Effort: Pulmonary effort is normal.  Abdominal:     General: There is no distension.     Palpations: Abdomen is soft.     Tenderness: There is no abdominal tenderness. There is no guarding.  Genitourinary:    Vagina: Vaginal discharge (yellowish discharge with odor noted) present.     Cervix: Normal. No discharge, friability or erythema.  Musculoskeletal:        General: Normal range of motion.     Cervical back: Normal range of motion.  Skin:    General: Skin is warm and dry.     Capillary Refill: Capillary refill takes less than 2 seconds.  Neurological:     Mental Status: She is alert and oriented to person, place, and time.  Psychiatric:        Mood and Affect: Mood normal.        Behavior: Behavior normal.    Labs and Imaging No results found for this or any previous visit (from the past 24 hours).  No results  found.   Assessment & Plan:  1. IUD check up (Primary) - IUD appears to be in place, strings present but not longer and nothing visualized in the cervix  2. 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  Dispense: 28 tablet; Refill: 0  3. Yeast vaginitis - fluconazole  (DIFLUCAN ) 150 MG tablet; Take 1 tablet (150 mg total) by mouth daily. Repeat dose in 3 days.  Dispense: 2 tablet; Refill: 0  Has follow up scan scheduled, can be seen PRN or for annual exam.  Vannie Cornell SAUNDERS, CNM 05/01/2024 3:18 PM

## 2024-05-02 ENCOUNTER — Ambulatory Visit (INDEPENDENT_AMBULATORY_CARE_PROVIDER_SITE_OTHER): Admitting: Mental Health

## 2024-05-02 DIAGNOSIS — F411 Generalized anxiety disorder: Secondary | ICD-10-CM

## 2024-05-02 DIAGNOSIS — F39 Unspecified mood [affective] disorder: Secondary | ICD-10-CM | POA: Insufficient documentation

## 2024-05-02 NOTE — Progress Notes (Signed)
" ° °  THERAPIST PROGRESS NOTE Virtual Visit via Video Note  I connected with Angela Sexton on 05/02/2024 at 10:00 AM EST by a video enabled telemedicine application and verified that I am speaking with the correct person using two identifiers.  Location: Patient: Work Provider: remote office   I discussed the limitations of evaluation and management by telemedicine and the availability of in person appointments. The patient expressed understanding and agreed to proceed.  I discussed the assessment and treatment plan with the patient. The patient was provided an opportunity to ask questions and all were answered. The patient agreed with the plan and demonstrated an understanding of the instructions.   The patient was advised to call back or seek an in-person evaluation if the symptoms worsen or if the condition fails to improve as anticipated.  I provided 33 minutes of non-face-to-face time during this encounter.   Ty Bernice Savant, [facility]   Session Time: 10:08 am (33 minutes)  Participation Level: Active  Behavioral Response: Casual and NeatAlertAnxious  Type of Therapy: Individual Therapy  Treatment Goals addressed: Stabilizing my moods. Angela Sexton will regulate moods AEB development of x 3 effective emotional regulation skills and x 3 distress tolerance skills within the next 90 days.   ProgressTowards Goals: Progressing  Interventions: CBT and Supportive  Summary: Angela Sexton is a 34 y.o. female who presents with dx of unspecified mood disorder, bipolarII disorder by hx and generalized anxiety disorder. Presents for session alert and oriented; mood and affect anxious dysphoric. Speech clear and coherent at normal rate and tone. shares with therapist recent stressors and episode of becoming overwhelmed in which she exploded. Shares factors that contributed to behavior outburst, noting several back to back stressors. Shares with therapist current stressors. Shares thought son  relationship with mother as well as relationship with partner. Shares current instability in housing with his main stressor but shares option for permanent housing. Shares would like to get back started on medications with hx of use of zoloft , in which she found beneficial. Notes needing to end session prematurely due to work. Denies safety concerns. Ongoing work towards goals.   Suicidal/Homicidal: Nowithout intent/plan  Therapist Response: Therapist engaged Angela Sexton in tele-therapy session. Completed check in and assessed for current level of functioning, sxs management and level of stressors. Explored current concerns and provided safe space to share thoughts and feelings. Supported in processing emotions and identification of current stressors/worries. Provided support and encouragement; validated feelings. Explores strategies to support managing big emotions and feelings of overwhelm and stress with use of STOP skilll. Educated on contacting office to engage in medication management services. Reviewed session and provided follow up.   Plan: Return again in  x 5 weeks.  Diagnosis: Generalized anxiety disorder  Collaboration of Care: Other None  Patient/Guardian was advised Release of Information must be obtained prior to any record release in order to collaborate their care with an outside provider. Patient/Guardian was advised if they have not already done so to contact the registration department to sign all necessary forms in order for us  to release information regarding their care.   Consent: Patient/Guardian gives verbal consent for treatment and assignment of benefits for services provided during this visit. Patient/Guardian expressed understanding and agreed to proceed.   Ty Bernice [place], [facility] 05/02/2024  "

## 2024-05-07 ENCOUNTER — Ambulatory Visit (HOSPITAL_BASED_OUTPATIENT_CLINIC_OR_DEPARTMENT_OTHER)

## 2024-05-08 ENCOUNTER — Ambulatory Visit (HOSPITAL_BASED_OUTPATIENT_CLINIC_OR_DEPARTMENT_OTHER)

## 2024-05-18 ENCOUNTER — Other Ambulatory Visit: Payer: Self-pay

## 2024-05-18 ENCOUNTER — Ambulatory Visit (INDEPENDENT_AMBULATORY_CARE_PROVIDER_SITE_OTHER): Payer: Self-pay | Admitting: Certified Nurse Midwife

## 2024-05-18 VITALS — BP 126/85 | HR 68 | Wt 187.0 lb

## 2024-05-18 DIAGNOSIS — F419 Anxiety disorder, unspecified: Secondary | ICD-10-CM | POA: Diagnosis not present

## 2024-05-18 DIAGNOSIS — Z5941 Food insecurity: Secondary | ICD-10-CM

## 2024-05-18 DIAGNOSIS — Z975 Presence of (intrauterine) contraceptive device: Secondary | ICD-10-CM

## 2024-05-18 MED ORDER — SERTRALINE HCL 25 MG PO TABS: 25.0000 mg | ORAL_TABLET | Freq: Every day | ORAL | 6 refills | Status: AC

## 2024-05-24 ENCOUNTER — Ambulatory Visit (INDEPENDENT_AMBULATORY_CARE_PROVIDER_SITE_OTHER): Admitting: Mental Health

## 2024-05-24 DIAGNOSIS — F39 Unspecified mood [affective] disorder: Secondary | ICD-10-CM

## 2024-05-24 DIAGNOSIS — F411 Generalized anxiety disorder: Secondary | ICD-10-CM | POA: Diagnosis not present

## 2024-05-24 NOTE — Progress Notes (Unsigned)
" ° °  THERAPIST PROGRESS NOTE Virtual Visit via Video Note  I connected with Angela Sexton on 05/24/2024 at  4:00 PM EST by a video enabled telemedicine application and verified that I am speaking with the correct person using two identifiers.  Location: Patient: home address on file Provider: remote office   I discussed the limitations of evaluation and management by telemedicine and the availability of in person appointments. The patient expressed understanding and agreed to proceed.    I discussed the assessment and treatment plan with the patient. The patient was provided an opportunity to ask questions and all were answered. The patient agreed with the plan and demonstrated an understanding of the instructions.   The patient was advised to call back or seek an in-person evaluation if the symptoms worsen or if the condition fails to improve as anticipated.  I provided 50 minutes of non-face-to-face time during this encounter.   Ty Bernice Savant, [facility]  Session Time: 4:20pm (50 minutes)  Participation Level: Active  Behavioral Response: CasualAlertAnxious and Dysphoric  Type of Therapy: Individual Therapy  Treatment Goals addressed: Stabilizing my moods. [place] will regulate moods AEB development of x 3 effective emotional regulation skills and x 3 distress tolerance skills within the next 90 days.   ProgressTowards Goals: Progressing  Interventions: CBT, Strength-based, and Supportive  Summary: Angela Sexton is a 34 y.o. female who presents with dx of unspecified mood disorder, bipolarII disorder by hx and generalized anxiety disorder. Presents for session alert and oriented; mood and affect anxious dysphoric. Speech clear and coherent at normal rate and tone.     Suicidal/Homicidal: Nowithout intent/plan  Therapist Response: Therapist engaged Trace in tele-therapy session. Completed check in and assessed for current level of functioning, sxs management and level of  stressors. Explored current concerns and provided safe space to share thoughts and feelings.   Plan: Return again in 3 weeks.  Diagnosis: Generalized anxiety disorder  Unspecified mood (affective) disorder  Collaboration of Care: Other none  Patient/Guardian was advised Release of Information must be obtained prior to any record release in order to collaborate their care with an outside provider. Patient/Guardian was advised if they have not already done so to contact the registration department to sign all necessary forms in order for us  to release information regarding their care.   Consent: Patient/Guardian gives verbal consent for treatment and assignment of benefits for services provided during this visit. Patient/Guardian expressed understanding and agreed to proceed.   Ty Bernice Miller, [facility] 05/24/2024  "

## 2024-05-27 ENCOUNTER — Encounter: Payer: Self-pay | Admitting: Certified Nurse Midwife

## 2024-05-27 DIAGNOSIS — B3731 Acute candidiasis of vulva and vagina: Secondary | ICD-10-CM

## 2024-05-27 MED ORDER — FLUCONAZOLE 150 MG PO TABS: 150.0000 mg | ORAL_TABLET | Freq: Every day | ORAL | 0 refills | Status: AC

## 2024-06-21 ENCOUNTER — Ambulatory Visit (HOSPITAL_COMMUNITY): Admitting: Mental Health

## 2024-07-14 ENCOUNTER — Ambulatory Visit (HOSPITAL_COMMUNITY): Admitting: Mental Health
